# Patient Record
Sex: Female | Born: 1995 | Race: Black or African American | Hispanic: No | Marital: Married | Smoking: Never smoker
Health system: Southern US, Community
[De-identification: ages and names within clinical notes are randomized; demographics above are authoritative.]

## PROBLEM LIST (undated history)

## (undated) ENCOUNTER — Inpatient Hospital Stay (HOSPITAL_COMMUNITY): Payer: Self-pay

## (undated) DIAGNOSIS — T8859XA Other complications of anesthesia, initial encounter: Secondary | ICD-10-CM

## (undated) DIAGNOSIS — D649 Anemia, unspecified: Secondary | ICD-10-CM

## (undated) DIAGNOSIS — E611 Iron deficiency: Secondary | ICD-10-CM

## (undated) DIAGNOSIS — T4145XA Adverse effect of unspecified anesthetic, initial encounter: Secondary | ICD-10-CM

## (undated) HISTORY — PX: NO PAST SURGERIES: SHX2092

## (undated) HISTORY — PX: CORONARY ARTERY BYPASS GRAFT: SHX141

---
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
## 2013-02-20 ENCOUNTER — Encounter (HOSPITAL_COMMUNITY): Payer: Self-pay | Admitting: Emergency Medicine

## 2013-02-20 DIAGNOSIS — Z79899 Other long term (current) drug therapy: Secondary | ICD-10-CM | POA: Insufficient documentation

## 2013-02-20 DIAGNOSIS — Z3202 Encounter for pregnancy test, result negative: Secondary | ICD-10-CM | POA: Insufficient documentation

## 2013-02-20 DIAGNOSIS — N898 Other specified noninflammatory disorders of vagina: Secondary | ICD-10-CM | POA: Insufficient documentation

## 2013-02-20 DIAGNOSIS — Z792 Long term (current) use of antibiotics: Secondary | ICD-10-CM | POA: Insufficient documentation

## 2013-02-20 DIAGNOSIS — Z862 Personal history of diseases of the blood and blood-forming organs and certain disorders involving the immune mechanism: Secondary | ICD-10-CM | POA: Insufficient documentation

## 2013-02-20 LAB — CBC WITH DIFFERENTIAL/PLATELET
Basophils Absolute: 0 10*3/uL (ref 0.0–0.1)
Basophils Relative: 0 % (ref 0–1)
Eosinophils Absolute: 0.1 10*3/uL (ref 0.0–0.7)
Eosinophils Relative: 2 % (ref 0–5)
HCT: 32.8 % — ABNORMAL LOW (ref 36.0–46.0)
Hemoglobin: 10.2 g/dL — ABNORMAL LOW (ref 12.0–15.0)
Lymphocytes Relative: 43 % (ref 12–46)
Lymphs Abs: 3.2 10*3/uL (ref 0.7–4.0)
MCH: 23.4 pg — ABNORMAL LOW (ref 26.0–34.0)
MCHC: 31.1 g/dL (ref 30.0–36.0)
MCV: 75.4 fL — ABNORMAL LOW (ref 78.0–100.0)
Monocytes Absolute: 0.6 10*3/uL (ref 0.1–1.0)
Monocytes Relative: 8 % (ref 3–12)
Neutro Abs: 3.5 10*3/uL (ref 1.7–7.7)
Neutrophils Relative %: 47 % (ref 43–77)
Platelets: 290 10*3/uL (ref 150–400)
RBC: 4.35 MIL/uL (ref 3.87–5.11)
RDW: 15.2 % (ref 11.5–15.5)
WBC: 7.4 10*3/uL (ref 4.0–10.5)

## 2013-02-20 LAB — COMPREHENSIVE METABOLIC PANEL
ALT: 10 U/L (ref 0–35)
AST: 19 U/L (ref 0–37)
Albumin: 3.9 g/dL (ref 3.5–5.2)
Alkaline Phosphatase: 68 U/L (ref 39–117)
BUN: 12 mg/dL (ref 6–23)
CO2: 27 mEq/L (ref 19–32)
Calcium: 9.1 mg/dL (ref 8.4–10.5)
Chloride: 106 mEq/L (ref 96–112)
Creatinine, Ser: 0.74 mg/dL (ref 0.50–1.10)
GFR calc Af Amer: >90 mL/min (ref >90)
GFR calc non Af Amer: >90 mL/min (ref >90)
Glucose, Bld: 88 mg/dL (ref 70–99)
Potassium: 3.5 mEq/L (ref 3.5–5.1)
Sodium: 142 mEq/L (ref 135–145)
Total Bilirubin: <0.1 mg/dL — ABNORMAL LOW (ref 0.3–1.2)
Total Protein: 8 g/dL (ref 6.0–8.3)

## 2013-02-20 NOTE — ED Notes (Signed)
Pt. reports vaginal bleeding for 2 weeks with low abdominal cramping .

## 2013-02-21 ENCOUNTER — Emergency Department (HOSPITAL_COMMUNITY)
Admission: EM | Admit: 2013-02-21 | Discharge: 2013-02-21 | Disposition: A | Discharge status: Home / Self Care | Payer: Medicaid Other | Attending: Emergency Medicine | Admitting: Emergency Medicine

## 2013-02-21 DIAGNOSIS — N939 Abnormal uterine and vaginal bleeding, unspecified: Secondary | ICD-10-CM

## 2013-02-21 LAB — URINALYSIS, ROUTINE W REFLEX MICROSCOPIC
Bilirubin Urine: NEGATIVE
Glucose, UA: NEGATIVE mg/dL
Ketones, ur: NEGATIVE mg/dL
Leukocytes, UA: NEGATIVE
Nitrite: NEGATIVE
Protein, ur: 30 mg/dL — AB
Specific Gravity, Urine: 1.037 — ABNORMAL HIGH (ref 1.005–1.030)
Urobilinogen, UA: 0.2 mg/dL (ref 0.0–1.0)
pH: 6.5 (ref 5.0–8.0)

## 2013-02-21 LAB — URINE MICROSCOPIC-ADD ON

## 2013-02-21 LAB — POCT PREGNANCY, URINE: Preg Test, Ur: NEGATIVE

## 2013-02-21 MED ORDER — NORETHIN-ETH ESTRAD-FE BIPHAS 1 MG-10 MCG / 10 MCG PO TABS: 1.0000 | ORAL_TABLET | Freq: Every day | ORAL | Status: DC

## 2013-02-21 MED ORDER — METRONIDAZOLE 500 MG PO TABS: 500.0000 mg | ORAL_TABLET | Freq: Two times a day (BID) | ORAL | Status: DC

## 2013-02-21 NOTE — ED Notes (Signed)
Pt ambulating independently w/ steady gait on d/c in no acute distress, A&Ox4. D/c instructions reviewed w/ pt and family - pt and family deny any further questions or concerns at present. Rx given x2  

## 2013-02-21 NOTE — ED Provider Notes (Signed)
CSN: [id number]     Arrival date & time 02/20/13  2242 History   First MD Initiated Contact with Patient 02/21/13 0401     Chief Complaint  Patient presents with  . Vaginal Bleeding   (Consider location/radiation/quality/duration/timing/severity/associated sxs/prior Treatment) HPI Comments: 17 year old female with a history of irregular periods and reports that she has had 2 weeks of intermittent vaginal bleeding with abdominal cramping. She is sexually active with another partner and only intermittently uses protection. 7 months ago she stopped taking birth control pills and since then she has had irregular menses.  Patient is a 17 y.o. female presenting with vaginal bleeding. The history is provided by the patient.  Vaginal Bleeding   Past Medical History  Diagnosis Date  . Low iron   . Anemia    History reviewed. No pertinent past surgical history. No family history on file. History  Substance Use Topics  . Smoking status: Never Smoker   . Smokeless tobacco: Not on file  . Alcohol Use: Yes     Comment: Ocassional   OB History   Grav Para Term Preterm Abortions TAB SAB Ect Mult Living                 Review of Systems  Genitourinary: Positive for vaginal bleeding.  All other systems reviewed and are negative.    Allergies  Review of patient's allergies indicates no known allergies.  Home Medications   Current Outpatient Rx  Name  Route  Sig  Dispense  Refill  . ibuprofen (ADVIL,MOTRIN) 200 MG tablet   Oral   Take 600 mg by mouth every 6 (six) hours as needed for fever, headache, mild pain, moderate pain or cramping.         . metroNIDAZOLE (FLAGYL) 500 MG tablet   Oral   Take 1 tablet (500 mg total) by mouth 2 (two) times daily.   14 tablet   0   . Norethindrone-Ethinyl Estradiol-Fe Biphas (LO LOESTRIN FE) 1 MG-10 MCG / 10 MCG tablet   Oral   Take 1 tablet by mouth daily.   1 Package   11    BP 109/65  Pulse 74  Temp(Src) 98.9 F (37.2 C) (Oral)   Resp 18  Ht 5\' 5"  (1.651 m)  Wt 137 lb (62.143 kg)  BMI 22.80 kg/m2  SpO2 100%  LMP 02/05/2013 Physical Exam  Nursing note and vitals reviewed. Constitutional: She appears well-developed and well-nourished. No distress.  HENT:  Head: Normocephalic and atraumatic.  Mouth/Throat: Oropharynx is clear and moist. No oropharyngeal exudate.  Eyes: Conjunctivae and EOM are normal. Pupils are equal, round, and reactive to light. Right eye exhibits no discharge. Left eye exhibits no discharge. No scleral icterus.  Neck: Normal range of motion. Neck supple. No JVD present. No thyromegaly present.  Cardiovascular: Normal rate, regular rhythm, normal heart sounds and intact distal pulses.  Exam reveals no gallop and no friction rub.   No murmur heard. Pulmonary/Chest: Effort normal and breath sounds normal. No respiratory distress. She has no wheezes. She has no rales.  Abdominal: Soft. Bowel sounds are normal. She exhibits no distension and no mass. There is no tenderness.  Genitourinary:  Patient declines exam  Musculoskeletal: Normal range of motion. She exhibits no edema and no tenderness.  Lymphadenopathy:    She has no cervical adenopathy.  Neurological: She is alert. Coordination normal.  Skin: Skin is warm and dry. No rash noted. No erythema.  Psychiatric: She has a normal mood and affect.  Her behavior is normal.    ED Course  Procedures (including critical care time) Labs Review Labs Reviewed  URINALYSIS, ROUTINE W REFLEX MICROSCOPIC - Abnormal; Notable for the following:    APPearance TURBID (*)    Specific Gravity, Urine 1.037 (*)    Hgb urine dipstick LARGE (*)    Protein, ur 30 (*)    All other components within normal limits  CBC WITH DIFFERENTIAL - Abnormal; Notable for the following:    Hemoglobin 10.2 (*)    HCT 32.8 (*)    MCV 75.4 (*)    MCH 23.4 (*)    All other components within normal limits  COMPREHENSIVE METABOLIC PANEL - Abnormal; Notable for the following:     Total Bilirubin <0.1 (*)    All other components within normal limits  URINE MICROSCOPIC-ADD ON - Abnormal; Notable for the following:    Squamous Epithelial / LPF FEW (*)    All other components within normal limits  POCT PREGNANCY, URINE   Imaging Review No results found.  EKG Interpretation   None       MDM   1. Vaginal bleeding    The patient has a soft nontender abdomen, she is not pregnant, she does complain that she may have an ongoing bacterial infection as she did not finish taking her Flagyl last time that she was diagnosed with a vaginal infection. She declines a pelvic exam but states that she would like another prescription for the bacterial vaginosis. I will restart her on oral contraceptive pills to help regulate her cycle, recommend gynecologist followup, patient is agreeable and appears nontoxic. Mild anemia seen on labs  Laboratory results reveal mild anemia, urinalysis without infection, small amount of blood. Not pregnant, stable for discharge on oral contraceptive and gynecology referral, this was discussed with the patient at length who agrees to do the same.  Vida Roller, MD 02/21/13 [phone]

## 2013-02-21 NOTE — ED Notes (Signed)
Pt reports she has been on her menstrual cycle x2 weeks, pt has a hx of same and states that her gynecologist at that time was able to give her a pill to take to cease the bleeding. Pt has been off of her birth control x7 months, admits to unprotected sexual intercourse as well. Pt also states she was diagnosed previously with a vaginal bacterial infection and forgot to complete her course of antibiotics. Pt also requesting medications for this as she is having similar symptoms.

## 2013-02-23 NOTE — L&D Delivery Note (Signed)
Delivery Note At 1:16 AM a viable, healthy female was delivered via Vaginal over an intact perineum. Spontaneous Delivery (Presentation: Right Occiput Posterior).  APGAR: 8, 9; weight: pending at time of note.  Placenta status: Intact, Spontaneous.  Cord: 3 vessels with the following complications: Nuchal times 2.   Anesthesia: Epidural  Episiotomy: none Lacerations: none (sm abrasion at base of introitus, well approx, not bldg) Suture Repair: N/A Est. Blood Loss (mL): 200  Family at bedside for support.  Mother bonding with baby well, skin to skin immediately after birth. Mom to postpartum.  Baby to Couplet care / Skin to Skin.  ADAMS,SHNIQUAL SHWON 12/23/2013, 1:37 AM  I have seen and examined this patient and I agree with the above. Cam HaiSHAW, KIMBERLY CNM 2:25 AM 12/23/2013

## 2013-05-23 ENCOUNTER — Encounter: Payer: Self-pay | Admitting: Obstetrics & Gynecology

## 2013-05-23 ENCOUNTER — Ambulatory Visit (INDEPENDENT_AMBULATORY_CARE_PROVIDER_SITE_OTHER): Payer: Medicaid Other

## 2013-05-23 VITALS — BP 118/68 | HR 81

## 2013-05-23 DIAGNOSIS — Z3201 Encounter for pregnancy test, result positive: Secondary | ICD-10-CM

## 2013-05-23 DIAGNOSIS — N926 Irregular menstruation, unspecified: Secondary | ICD-10-CM

## 2013-05-23 LAB — POCT URINALYSIS DIP (DEVICE)
Bilirubin Urine: NEGATIVE
Glucose, UA: NEGATIVE mg/dL
Ketones, ur: NEGATIVE mg/dL
Nitrite: NEGATIVE
Protein, ur: 30 mg/dL — AB
Specific Gravity, Urine: 1.02 (ref 1.005–1.030)
Urobilinogen, UA: 1 mg/dL (ref 0.0–1.0)
pH: 7.5 (ref 5.0–8.0)

## 2013-05-23 LAB — HIV ANTIBODY (ROUTINE TESTING W REFLEX): HIV: NONREACTIVE

## 2013-05-23 LAB — POCT PREGNANCY, URINE: Preg Test, Ur: POSITIVE — AB

## 2013-05-23 NOTE — Progress Notes (Signed)
Pt. Here today for pregnancy test as she has not had a period since 03/22/13. Pt. Reports her periods were regular. Pregnancy test positive. Pt. Would like to receive care here. Based on LMP pt. 9weeks today. New OB labs today. Growth and anatomy ultrasound scheduled for August 02, 2013 at 0830. Letter of pregnancy verification given. EDD: December 27, 2013. Pt. To schedule New OB appointment.

## 2013-05-24 ENCOUNTER — Encounter: Payer: Self-pay | Admitting: Obstetrics & Gynecology

## 2013-05-24 ENCOUNTER — Telehealth: Payer: Self-pay

## 2013-05-24 LAB — OBSTETRIC PANEL
Antibody Screen: NEGATIVE
Basophils Absolute: 0 10*3/uL (ref 0.0–0.1)
Basophils Relative: 0 % (ref 0–1)
Eosinophils Absolute: 0.1 10*3/uL (ref 0.0–0.7)
Eosinophils Relative: 1 % (ref 0–5)
HCT: 31.7 % — ABNORMAL LOW (ref 36.0–46.0)
Hemoglobin: 10.1 g/dL — ABNORMAL LOW (ref 12.0–15.0)
Hepatitis B Surface Ag: NEGATIVE
Lymphocytes Relative: 24 % (ref 12–46)
Lymphs Abs: 1.2 10*3/uL (ref 0.7–4.0)
MCH: 22.7 pg — ABNORMAL LOW (ref 26.0–34.0)
MCHC: 31.9 g/dL (ref 30.0–36.0)
MCV: 71.4 fL — ABNORMAL LOW (ref 78.0–100.0)
Monocytes Absolute: 0.4 10*3/uL (ref 0.1–1.0)
Monocytes Relative: 8 % (ref 3–12)
Neutro Abs: 3.5 10*3/uL (ref 1.7–7.7)
Neutrophils Relative %: 67 % (ref 43–77)
Platelets: 313 10*3/uL (ref 150–400)
RBC: 4.44 MIL/uL (ref 3.87–5.11)
RDW: 15.9 % — ABNORMAL HIGH (ref 11.5–15.5)
Rh Type: POSITIVE
Rubella: 6.46 Index — ABNORMAL HIGH (ref <0.90)
WBC: 5.2 10*3/uL (ref 4.0–10.5)

## 2013-05-24 NOTE — Telephone Encounter (Signed)
Called pt and informed pt that her blood levels show that she is anemic and that the provider would like for her to take otc iron supplements po bid for anemia.  Pt stated understanding.

## 2013-05-24 NOTE — Telephone Encounter (Signed)
Message copied by Faythe CasaBELLAMY, Denim Kalmbach M on Wed May 24, 2013  4:17 PM ------      Message from: Willodean RosenthalHARRAWAY-SMITH, CAROLYN      Created: Wed May 24, 2013  2:07 PM       Please call pt.  She needs to begin FeSO4 for bid anemia.            clh-S ------

## 2013-05-25 LAB — HEMOGLOBINOPATHY EVALUATION
Hemoglobin Other: 0 %
Hgb A2 Quant: 2.8 % (ref 2.2–3.2)
Hgb A: 97.2 % (ref 96.8–97.8)
Hgb F Quant: 0 % (ref 0.0–2.0)
Hgb S Quant: 0 %

## 2013-06-28 ENCOUNTER — Ambulatory Visit (INDEPENDENT_AMBULATORY_CARE_PROVIDER_SITE_OTHER): Payer: Self-pay | Admitting: Obstetrics and Gynecology

## 2013-06-28 ENCOUNTER — Encounter: Payer: Self-pay | Admitting: Obstetrics and Gynecology

## 2013-06-28 VITALS — BP 117/77 | HR 97 | Temp 98.5°F | Wt 138.7 lb

## 2013-06-28 DIAGNOSIS — Z3400 Encounter for supervision of normal first pregnancy, unspecified trimester: Secondary | ICD-10-CM

## 2013-06-28 LAB — POCT URINALYSIS DIP (DEVICE)
Bilirubin Urine: NEGATIVE
Glucose, UA: NEGATIVE mg/dL
Hgb urine dipstick: NEGATIVE
Ketones, ur: NEGATIVE mg/dL
Leukocytes, UA: NEGATIVE
Nitrite: NEGATIVE
Protein, ur: 30 mg/dL — AB
Specific Gravity, Urine: 1.025 (ref 1.005–1.030)
Urobilinogen, UA: 0.2 mg/dL (ref 0.0–1.0)
pH: 7 (ref 5.0–8.0)

## 2013-06-28 LAB — OB RESULTS CONSOLE GBS: GBS: POSITIVE

## 2013-06-28 NOTE — Patient Instructions (Signed)
Second Trimester of Pregnancy The second trimester is from week 13 through week 28, months 4 through 6. The second trimester is often a time when you feel your best. Your body has also adjusted to being pregnant, and you begin to feel better physically. Usually, morning sickness has lessened or quit completely, you may have more energy, and you may have an increase in appetite. The second trimester is also a time when the fetus is growing rapidly. At the end of the sixth month, the fetus is about 9 inches long and weighs about 1 pounds. You will likely begin to feel the baby move (quickening) between 18 and 20 weeks of the pregnancy. BODY CHANGES Your body goes through many changes during pregnancy. The changes vary from woman to woman.   Your weight will continue to increase. You will notice your lower abdomen bulging out.  You may begin to get stretch marks on your hips, abdomen, and breasts.  You may develop headaches that can be relieved by medicines approved by your caregiver.  You may urinate more often because the fetus is pressing on your bladder.  You may develop or continue to have heartburn as a result of your pregnancy.  You may develop constipation because certain hormones are causing the muscles that push waste through your intestines to slow down.  You may develop hemorrhoids or swollen, bulging veins (varicose veins).  You may have back pain because of the weight gain and pregnancy hormones relaxing your joints between the bones in your pelvis and as a result of a shift in weight and the muscles that support your balance.  Your breasts will continue to grow and be tender.  Your gums may bleed and may be sensitive to brushing and flossing.  Dark spots or blotches (chloasma, mask of pregnancy) may develop on your face. This will likely fade after the baby is born.  A dark line from your belly button to the pubic area (linea nigra) may appear. This will likely fade after the  baby is born. WHAT TO EXPECT AT YOUR PRENATAL VISITS During a routine prenatal visit:  You will be weighed to make sure you and the fetus are growing normally.  Your blood pressure will be taken.  Your abdomen will be measured to track your baby's growth.  The fetal heartbeat will be listened to.  Any test results from the previous visit will be discussed. Your caregiver may ask you:  How you are feeling.  If you are feeling the baby move.  If you have had any abnormal symptoms, such as leaking fluid, bleeding, severe headaches, or abdominal cramping.  If you have any questions. Other tests that may be performed during your second trimester include:  Blood tests that check for:  Low iron levels (anemia).  Gestational diabetes (between 24 and 28 weeks).  Rh antibodies.  Urine tests to check for infections, diabetes, or protein in the urine.  An ultrasound to confirm the proper growth and development of the baby.  An amniocentesis to check for possible genetic problems.  Fetal screens for spina bifida and Down syndrome. HOME CARE INSTRUCTIONS   Avoid all smoking, herbs, alcohol, and unprescribed drugs. These chemicals affect the formation and growth of the baby.  Follow your caregiver's instructions regarding medicine use. There are medicines that are either safe or unsafe to take during pregnancy.  Exercise only as directed by your caregiver. Experiencing uterine cramps is a good sign to stop exercising.  Continue to eat regular,   healthy meals.  Wear a good support bra for breast tenderness.  Do not use hot tubs, steam rooms, or saunas.  Wear your seat belt at all times when driving.  Avoid raw meat, uncooked cheese, cat litter boxes, and soil used by cats. These carry germs that can cause birth defects in the baby.  Take your prenatal vitamins.  Try taking a stool softener (if your caregiver approves) if you develop constipation. Eat more high-fiber foods,  such as fresh vegetables or fruit and whole grains. Drink plenty of fluids to keep your urine clear or pale yellow.  Take warm sitz baths to soothe any pain or discomfort caused by hemorrhoids. Use hemorrhoid cream if your caregiver approves.  If you develop varicose veins, wear support hose. Elevate your feet for 15 minutes, 3 4 times a day. Limit salt in your diet.  Avoid heavy lifting, wear low heel shoes, and practice good posture.  Rest with your legs elevated if you have leg cramps or low back pain.  Visit your dentist if you have not gone yet during your pregnancy. Use a soft toothbrush to brush your teeth and be gentle when you floss.  A sexual relationship may be continued unless your caregiver directs you otherwise.  Continue to go to all your prenatal visits as directed by your caregiver. SEEK MEDICAL CARE IF:   You have dizziness.  You have mild pelvic cramps, pelvic pressure, or nagging pain in the abdominal area.  You have persistent nausea, vomiting, or diarrhea.  You have a bad smelling vaginal discharge.  You have pain with urination. SEEK IMMEDIATE MEDICAL CARE IF:   You have a fever.  You are leaking fluid from your vagina.  You have spotting or bleeding from your vagina.  You have severe abdominal cramping or pain.  You have rapid weight gain or loss.  You have shortness of breath with chest pain.  You notice sudden or extreme swelling of your face, hands, ankles, feet, or legs.  You have not felt your baby move in over an hour.  You have severe headaches that do not go away with medicine.  You have vision changes. Document Released: 02/03/2001 Document Revised: 10/12/2012 Document Reviewed: 04/12/2012 [employer] Patient Information 2014 [employer].  

## 2013-06-28 NOTE — Progress Notes (Signed)
C/o of white discharge with slight odor; never finished treatment for BV. Wet prep today.  All new OB labs drawn 3/31. Urine culture and cultures to be done today.  New OB packet given.  Discussed appropriate weight gain (25-35); pt. Verbalized understanding.

## 2013-06-28 NOTE — Progress Notes (Signed)
   Subjective:    Anita Robinson is a G1P0 7458w0d being seen today for her first obstetrical visit.  Her obstetrical history is significant for G1. Patient does intend to breast feed. Pregnancy history fully reviewed.  Patient reports nausea. Supportive family and SOB. Works [employer].  Filed Vitals:   06/28/13 0934  BP: 117/77  Pulse: 97  Temp: 98.5 F (36.9 C)  Weight: 138 lb 11.2 oz (62.914 kg)    HISTORY: OB History  Gravida Para Term Preterm AB SAB TAB Ectopic Multiple Living  1             # Outcome Date GA Lbr Len/2nd Weight Sex Delivery Anes PTL Lv  1 CUR              Past Medical History  Diagnosis Date  . Low iron   . Anemia    History reviewed. No pertinent past surgical history. History reviewed. No pertinent family history.   Exam    Uterus:   S=D FHR 160  Pelvic Exam:    Perineum: No Hemorrhoids   Vulva: normal, Bartholin's, Urethra, Skene's normal   Vagina:  normal mucosa, normal discharge       Cervix: no bleeding following Pap and nulliparous appearance   Adnexa: not evaluated   Bony Pelvis: average  System: Breast:  normal appearance, no masses or tenderness, Inspection negative   Skin: normal coloration and turgor, no rashes    Neurologic: oriented, normal, grossly non-focal   Extremities: normal strength, tone, and muscle mass, no deformities   HEENT PERRLA, extra ocular movement intact and thyroid without masses   Mouth/Teeth mucous membranes moist, pharynx normal without lesions and dental hygiene good   Neck supple and no masses   Cardiovascular: regular rate and rhythm, no murmurs or gallops   Respiratory:  appears well, vitals normal, no respiratory distress, acyanotic, normal RR, ear and throat exam is normal, neck free of mass or lymphadenopathy, chest clear, no wheezing, crepitations, rhonchi, normal symmetric air entry   Abdomen: soft, non-tender; bowel sounds normal; no masses,  no organomegaly   Urinary: urethral meatus normal       Assessment:    Pregnancy: G1P0 Patient Active Problem List   Diagnosis Date Noted  . Anemia complicating pregnancy in first trimester 05/24/2013        Plan:     Initial labs drawn. Prenatal vitamins. Problem list reviewed and updated. Genetic Screening discussed Quad Screen: undecided.  Ultrasound discussed; fetal survey: ordered.  Follow up in 4 weeks. 50% of 30 min visit spent on counseling and coordination of care.  Visit routines, fetal development, nausea discussed.   Corine Solorio Colin Mulders Arvil Utz 06/28/2013

## 2013-06-29 LAB — WET PREP, GENITAL
Clue Cells Wet Prep HPF POC: NONE SEEN
Trich, Wet Prep: NONE SEEN

## 2013-06-29 LAB — GC/CHLAMYDIA PROBE AMP
CT Probe RNA: NEGATIVE
GC Probe RNA: NEGATIVE

## 2013-07-01 LAB — CULTURE, OB URINE: Colony Count: 1000

## 2013-07-03 ENCOUNTER — Telehealth: Payer: Self-pay | Admitting: *Deleted

## 2013-07-03 DIAGNOSIS — B379 Candidiasis, unspecified: Secondary | ICD-10-CM

## 2013-07-03 MED ORDER — FLUCONAZOLE 150 MG PO TABS: 150.0000 mg | ORAL_TABLET | Freq: Every day | ORAL | Status: DC

## 2013-07-03 NOTE — Telephone Encounter (Signed)
Pt has moderate yeast on wet prep. Sent rx for diflucan to pharmacy.

## 2013-07-04 ENCOUNTER — Telehealth: Payer: Self-pay | Admitting: General Practice

## 2013-07-04 DIAGNOSIS — O2342 Unspecified infection of urinary tract in pregnancy, second trimester: Principal | ICD-10-CM

## 2013-07-04 DIAGNOSIS — B951 Streptococcus, group B, as the cause of diseases classified elsewhere: Secondary | ICD-10-CM

## 2013-07-04 NOTE — Telephone Encounter (Signed)
Message copied by Kathee DeltonHILLMAN, Cashmere Dingley L on Tue Jul 04, 2013  4:44 PM ------      Message from: Danae OrleansPOE, DEIRDRE C      Created: Tue Jul 04, 2013  4:31 PM       Strep in urine> Rx Amox 500 tid x7 ------

## 2013-07-04 NOTE — Telephone Encounter (Signed)
Called patient and man answered stating she wasn't in. Told him to have the patient call us back. He stated that he would

## 2013-07-05 MED ORDER — AMOXICILLIN 500 MG PO CAPS: 500.0000 mg | ORAL_CAPSULE | Freq: Three times a day (TID) | ORAL | Status: DC

## 2013-07-05 NOTE — Telephone Encounter (Signed)
Called pt and notified her of UTI requiring antibiotic treatment.  Pt voiced understanding. Rx sent to pharmacy.  Pt asked for her next appt to be with a midwife.  Pt advised that her appt will be changed to 6/3 @ 0940 w/midwife.  She voiced understanding.

## 2013-07-26 ENCOUNTER — Ambulatory Visit (INDEPENDENT_AMBULATORY_CARE_PROVIDER_SITE_OTHER): Payer: Medicaid Other | Admitting: Advanced Practice Midwife

## 2013-07-26 ENCOUNTER — Encounter: Payer: Self-pay | Admitting: Family Medicine

## 2013-07-26 VITALS — BP 107/70 | HR 96 | Temp 98.5°F | Wt 144.0 lb

## 2013-07-26 DIAGNOSIS — Z3490 Encounter for supervision of normal pregnancy, unspecified, unspecified trimester: Secondary | ICD-10-CM

## 2013-07-26 DIAGNOSIS — Z3480 Encounter for supervision of other normal pregnancy, unspecified trimester: Secondary | ICD-10-CM

## 2013-07-26 LAB — POCT URINALYSIS DIP (DEVICE)
Bilirubin Urine: NEGATIVE
Glucose, UA: NEGATIVE mg/dL
Hgb urine dipstick: NEGATIVE
Ketones, ur: NEGATIVE mg/dL
Nitrite: NEGATIVE
Protein, ur: NEGATIVE mg/dL
Specific Gravity, Urine: 1.025 (ref 1.005–1.030)
Urobilinogen, UA: 0.2 mg/dL (ref 0.0–1.0)
pH: 7 (ref 5.0–8.0)

## 2013-07-26 NOTE — Progress Notes (Signed)
Pt reports back pain and is having difficulty sleeping.  Bump on abdomen that pops out and then goes away.

## 2013-07-26 NOTE — Progress Notes (Signed)
Doing well. Has some back pain at times. Describes "bump" as coming and going, to right of midline. No hernia appreciated. Too high for inguinal hernia. Probably GI. Non-painful.

## 2013-07-26 NOTE — Patient Instructions (Signed)
Second Trimester of Pregnancy The second trimester is from week 13 through week 28, months 4 through 6. The second trimester is often a time when you feel your best. Your body has also adjusted to being pregnant, and you begin to feel better physically. Usually, morning sickness has lessened or quit completely, you may have more energy, and you may have an increase in appetite. The second trimester is also a time when the fetus is growing rapidly. At the end of the sixth month, the fetus is about 9 inches long and weighs about 1 pounds. You will likely begin to feel the baby move (quickening) between 18 and 20 weeks of the pregnancy. BODY CHANGES Your body goes through many changes during pregnancy. The changes vary from woman to woman.   Your weight will continue to increase. You will notice your lower abdomen bulging out.  You may begin to get stretch marks on your hips, abdomen, and breasts.  You may develop headaches that can be relieved by medicines approved by your caregiver.  You may urinate more often because the fetus is pressing on your bladder.  You may develop or continue to have heartburn as a result of your pregnancy.  You may develop constipation because certain hormones are causing the muscles that push waste through your intestines to slow down.  You may develop hemorrhoids or swollen, bulging veins (varicose veins).  You may have back pain because of the weight gain and pregnancy hormones relaxing your joints between the bones in your pelvis and as a result of a shift in weight and the muscles that support your balance.  Your breasts will continue to grow and be tender.  Your gums may bleed and may be sensitive to brushing and flossing.  Dark spots or blotches (chloasma, mask of pregnancy) may develop on your face. This will likely fade after the baby is born.  A dark line from your belly button to the pubic area (linea nigra) may appear. This will likely fade after the  baby is born. WHAT TO EXPECT AT YOUR PRENATAL VISITS During a routine prenatal visit:  You will be weighed to make sure you and the fetus are growing normally.  Your blood pressure will be taken.  Your abdomen will be measured to track your baby's growth.  The fetal heartbeat will be listened to.  Any test results from the previous visit will be discussed. Your caregiver may ask you:  How you are feeling.  If you are feeling the baby move.  If you have had any abnormal symptoms, such as leaking fluid, bleeding, severe headaches, or abdominal cramping.  If you have any questions. Other tests that may be performed during your second trimester include:  Blood tests that check for:  Low iron levels (anemia).  Gestational diabetes (between 24 and 28 weeks).  Rh antibodies.  Urine tests to check for infections, diabetes, or protein in the urine.  An ultrasound to confirm the proper growth and development of the baby.  An amniocentesis to check for possible genetic problems.  Fetal screens for spina bifida and Down syndrome. HOME CARE INSTRUCTIONS   Avoid all smoking, herbs, alcohol, and unprescribed drugs. These chemicals affect the formation and growth of the baby.  Follow your caregiver's instructions regarding medicine use. There are medicines that are either safe or unsafe to take during pregnancy.  Exercise only as directed by your caregiver. Experiencing uterine cramps is a good sign to stop exercising.  Continue to eat regular,   healthy meals.  Wear a good support bra for breast tenderness.  Do not use hot tubs, steam rooms, or saunas.  Wear your seat belt at all times when driving.  Avoid raw meat, uncooked cheese, cat litter boxes, and soil used by cats. These carry germs that can cause birth defects in the baby.  Take your prenatal vitamins.  Try taking a stool softener (if your caregiver approves) if you develop constipation. Eat more high-fiber foods,  such as fresh vegetables or fruit and whole grains. Drink plenty of fluids to keep your urine clear or pale yellow.  Take warm sitz baths to soothe any pain or discomfort caused by hemorrhoids. Use hemorrhoid cream if your caregiver approves.  If you develop varicose veins, wear support hose. Elevate your feet for 15 minutes, 3 4 times a day. Limit salt in your diet.  Avoid heavy lifting, wear low heel shoes, and practice good posture.  Rest with your legs elevated if you have leg cramps or low back pain.  Visit your dentist if you have not gone yet during your pregnancy. Use a soft toothbrush to brush your teeth and be gentle when you floss.  A sexual relationship may be continued unless your caregiver directs you otherwise.  Continue to go to all your prenatal visits as directed by your caregiver. SEEK MEDICAL CARE IF:   You have dizziness.  You have mild pelvic cramps, pelvic pressure, or nagging pain in the abdominal area.  You have persistent nausea, vomiting, or diarrhea.  You have a bad smelling vaginal discharge.  You have pain with urination. SEEK IMMEDIATE MEDICAL CARE IF:   You have a fever.  You are leaking fluid from your vagina.  You have spotting or bleeding from your vagina.  You have severe abdominal cramping or pain.  You have rapid weight gain or loss.  You have shortness of breath with chest pain.  You notice sudden or extreme swelling of your face, hands, ankles, feet, or legs.  You have not felt your baby move in over an hour.  You have severe headaches that do not go away with medicine.  You have vision changes. Document Released: 02/03/2001 Document Revised: 10/12/2012 Document Reviewed: 04/12/2012 [employer] Patient Information 2014 [employer].  

## 2013-08-01 ENCOUNTER — Other Ambulatory Visit: Payer: Self-pay | Admitting: Obstetrics & Gynecology

## 2013-08-02 ENCOUNTER — Ambulatory Visit (HOSPITAL_COMMUNITY)
Admission: RE | Admit: 2013-08-02 | Discharge: 2013-08-02 | Disposition: A | Discharge status: Home / Self Care | Payer: Medicaid Other | Source: Ambulatory Visit | Attending: Obstetrics & Gynecology | Admitting: Obstetrics & Gynecology

## 2013-08-02 DIAGNOSIS — Z3201 Encounter for pregnancy test, result positive: Secondary | ICD-10-CM

## 2013-08-02 DIAGNOSIS — Z3689 Encounter for other specified antenatal screening: Secondary | ICD-10-CM | POA: Insufficient documentation

## 2013-08-02 DIAGNOSIS — O99011 Anemia complicating pregnancy, first trimester: Secondary | ICD-10-CM

## 2013-08-08 ENCOUNTER — Encounter (HOSPITAL_COMMUNITY): Payer: Self-pay | Admitting: *Deleted

## 2013-08-08 ENCOUNTER — Inpatient Hospital Stay (HOSPITAL_COMMUNITY)
Admission: AD | Admit: 2013-08-08 | Discharge: 2013-08-08 | Disposition: A | Discharge status: Home / Self Care | Payer: Medicaid Other | Source: Ambulatory Visit | Attending: Obstetrics & Gynecology | Admitting: Obstetrics & Gynecology

## 2013-08-08 DIAGNOSIS — K529 Noninfective gastroenteritis and colitis, unspecified: Secondary | ICD-10-CM

## 2013-08-08 DIAGNOSIS — A5901 Trichomonal vulvovaginitis: Secondary | ICD-10-CM | POA: Insufficient documentation

## 2013-08-08 DIAGNOSIS — A599 Trichomoniasis, unspecified: Secondary | ICD-10-CM

## 2013-08-08 DIAGNOSIS — R109 Unspecified abdominal pain: Secondary | ICD-10-CM | POA: Insufficient documentation

## 2013-08-08 DIAGNOSIS — O98819 Other maternal infectious and parasitic diseases complicating pregnancy, unspecified trimester: Secondary | ICD-10-CM | POA: Insufficient documentation

## 2013-08-08 DIAGNOSIS — K5289 Other specified noninfective gastroenteritis and colitis: Secondary | ICD-10-CM | POA: Insufficient documentation

## 2013-08-08 DIAGNOSIS — O210 Mild hyperemesis gravidarum: Secondary | ICD-10-CM | POA: Insufficient documentation

## 2013-08-08 LAB — URINE MICROSCOPIC-ADD ON

## 2013-08-08 LAB — URINALYSIS, ROUTINE W REFLEX MICROSCOPIC
Bilirubin Urine: NEGATIVE
Glucose, UA: NEGATIVE mg/dL
Hgb urine dipstick: NEGATIVE
Ketones, ur: 40 mg/dL — AB
Nitrite: NEGATIVE
Protein, ur: NEGATIVE mg/dL
Specific Gravity, Urine: 1.01 (ref 1.005–1.030)
Urobilinogen, UA: 1 mg/dL (ref 0.0–1.0)
pH: 7 (ref 5.0–8.0)

## 2013-08-08 MED ORDER — ONDANSETRON 8 MG PO TBDP
8.0000 mg | ORAL_TABLET | Freq: Once | ORAL | Status: AC
  Administered 2013-08-08: 8 mg via ORAL
  Filled 2013-08-08: qty 1

## 2013-08-08 MED ORDER — PROMETHAZINE HCL 25 MG PO TABS: 25.0000 mg | ORAL_TABLET | Freq: Four times a day (QID) | ORAL | Status: DC | PRN

## 2013-08-08 MED ORDER — METRONIDAZOLE 500 MG PO TABS
2000.0000 mg | ORAL_TABLET | Freq: Once | ORAL | Status: AC
  Administered 2013-08-08: 2000 mg via ORAL
  Filled 2013-08-08: qty 4

## 2013-08-08 MED ORDER — ONDANSETRON 4 MG PO TBDP: 4.0000 mg | ORAL_TABLET | Freq: Four times a day (QID) | ORAL | Status: DC | PRN

## 2013-08-08 NOTE — Progress Notes (Signed)
Pt is having upper and lower abdominal pain

## 2013-08-08 NOTE — MAU Note (Signed)
Pt states she feels "much better" 

## 2013-08-08 NOTE — Discharge Instructions (Signed)
Morning Sickness Morning sickness is when you feel sick to your stomach (nauseous) during pregnancy. This nauseous feeling may or may not come with vomiting. It often occurs in the morning but can be a problem any time of day. Morning sickness is most common during the first trimester, but it may continue throughout pregnancy. While morning sickness is unpleasant, it is usually harmless unless you develop severe and continual vomiting (hyperemesis gravidarum). This condition requires more intense treatment.  CAUSES  The cause of morning sickness is not completely known but seems to be related to normal hormonal changes that occur in pregnancy. RISK FACTORS You are at greater risk if you:  Experienced nausea or vomiting before your pregnancy.  Had morning sickness during a previous pregnancy.  Are pregnant with more than one baby, such as twins. TREATMENT  Do not use any medicines (prescription, over-the-counter, or herbal) for morning sickness without first talking to your health care provider. Your health care provider may prescribe or recommend:  Vitamin B6 supplements in the morning and at bedtime and Unisom (Doxalymine) at bedtime.  Anti-nausea medicines.  The herbal medicine ginger. HOME CARE INSTRUCTIONS   Only take over-the-counter or prescription medicines as directed by your health care provider.  Taking multivitamins before getting pregnant can prevent or decrease the severity of morning sickness in most women.   Eat a piece of dry toast or unsalted crackers before getting out of bed in the morning.   Eat five or six small meals a day.   Eat dry and bland foods (rice, baked potato). Foods high in carbohydrates are often helpful.  Do not drink liquids with your meals. Drink liquids between meals.   Avoid greasy, fatty, and spicy foods.   Get someone to cook for you if the smell of any food causes nausea and vomiting.   If you feel nauseous after taking prenatal  vitamins, take the vitamins at night or with a snack.  Snack on protein foods (nuts, yogurt, cheese) between meals if you are hungry.   Eat unsweetened gelatins for desserts.   Wearing an acupressure wristband (worn for sea sickness) may be helpful.   Acupuncture may be helpful.   Do not smoke.   Get a humidifier to keep the air in your house free of odors.   Get plenty of fresh air. SEEK MEDICAL CARE IF:   Your home remedies are not working, and you need medicine.  You feel dizzy or lightheaded.  You are losing weight. SEEK IMMEDIATE MEDICAL CARE IF:   You have persistent and uncontrolled nausea and vomiting.  You pass out (faint). Document Released: 04/02/2006 Document Revised: 10/12/2012 Document Reviewed: 07/27/2012 [facility] Patient Information 2014 [place], [place].

## 2013-08-08 NOTE — MAU Note (Signed)
Pt states she diarrhea 2-3 yesterday but nothing today.Pt state the throwing up is bad. Pt states she has thrown up 3 times today.pt throws up every time she tries to eat

## 2013-08-08 NOTE — MAU Provider Note (Signed)
History     CSN: [id number]  Arrival date and time: 08/08/13 1610   First Provider Initiated Contact with Patient 08/08/13 1645      Chief Complaint  Patient presents with  . Emesis During Pregnancy  . Diarrhea  . Abdominal Pain   HPI  Anita Robinson is a 18 y/o G1P0 at 747w6d who presents to the MAU with complaints of abdominal pain, nausea and vomiting. The abdominal pain began about 3 weeks ago. It is intermittent and feels like a twisting upward pressure in both her lower and upper abdomen. Pt rates the severity to be a 6/10. Pt also began feeling nauseous at 17:00 yesterday evening after work. Pt states that she has vomited 9-10x in the past 24 hours and is not able to keep any food or drink down. She has not eaten anything since 11:00 this morning. Pt has not taken anything to relieve the symptoms of abdominal pain or nausea. Pt also endorses headaches that are new since the onset of pregnancy that she ranks to be 5/10 in severity. Tylenol helps alleviate the pain. Pt states that she feels dizzy  At times and gets short of breath and chills easily and associates those symptoms with her vomiting episodes and does not currently feel any of these symptoms right now. Pt reports a PMH of anemia and has run out of her Fe supplementation 2 weeks ago. Pt denies any  Fevers, vaginal bleeding or discharge and UTI symptoms. Pt states that she has had one episode of diarrhea last night but it does not concern her today. [facility]   Past Medical History  Diagnosis Date  . Low iron   . Anemia     Past Surgical History  Procedure Laterality Date  . No past surgeries      Family History  Problem Relation Age of Onset  . Alcohol abuse Neg Hx   . Arthritis Neg Hx   . Asthma Neg Hx   . Birth defects Neg Hx   . Cancer Neg Hx   . COPD Neg Hx   . Depression Neg Hx   . Diabetes Neg Hx   . Drug abuse Neg Hx   . Early death Neg Hx   . Hearing  loss Neg Hx   . Heart disease Neg Hx   . Hyperlipidemia Neg Hx   . Hypertension Neg Hx   . Kidney disease Neg Hx   . Learning disabilities Neg Hx   . Mental illness Neg Hx   . Mental retardation Neg Hx   . Miscarriages / Stillbirths Neg Hx   . Stroke Neg Hx   . Vision loss Neg Hx   . Varicose Veins Neg Hx     History  Substance Use Topics  . Smoking status: Never Smoker   . Smokeless tobacco: Never Used  . Alcohol Use: No    Allergies: No Known Allergies  Prescriptions prior to admission  Medication Sig Dispense Refill  . acetaminophen (TYLENOL) 325 MG tablet Take 325 mg by mouth every 6 (six) hours as needed for headache.      . ferrous sulfate 325 (65 FE) MG tablet Take 325 mg by mouth daily with breakfast.      . Prenatal Vit-Fe Fumarate-FA (PRENATAL MULTIVITAMIN) TABS tablet Take 1 tablet by mouth daily at 12 noon.        Review of Systems  Constitutional: Positive for chills. Negative for fever.  Respiratory: Negative for shortness of breath (but does endorse "weird breathing" during vomiting episodes).   Cardiovascular: Negative for chest pain.  Gastrointestinal: Positive for nausea (constant), vomiting (9-10x in last 24 hours), abdominal pain (upper and lower abdominal pain and pressure at 6/10 pain) and diarrhea. Negative for heartburn and constipation.  Genitourinary: Negative for dysuria, urgency, frequency and hematuria.       Negative for vaginal bleeding and discharge  Neurological: Positive for headaches (5/10 since pregnancy ). Negative for dizziness.   Physical Exam   Blood pressure 101/69, pulse 108, temperature 99.8 F (37.7 C), temperature source Oral, resp. rate 16, height 5' 5.5" (1.664 m), weight 65.681 kg (144 lb 12.8 oz), last menstrual period 03/22/2013, SpO2 100.00%.  Physical Exam  Constitutional: She is oriented to person, place, and time. She appears well-developed and well-nourished. No distress.  Cardiovascular: Regular rhythm and normal  heart sounds.  Tachycardia present.   Respiratory: Effort normal and breath sounds normal.  GI: Soft. Bowel sounds are normal. She exhibits no distension. There is tenderness (mild discomfort in LLQ and suprapubic area to deep palpation). There is no rebound and no guarding.  Neurological: She is alert and oriented to person, place, and time.  Skin: Skin is warm and dry.  Psychiatric: She has a normal mood and affect. Her behavior is normal.     MAU Course  Procedures  MDM Gave Zofran 8 mg disintegrating tablet PO, one time dose > reports relief > no more episodes of vomiting.    Results for orders placed during the hospital encounter of 08/08/13 (from the past 24 hour(s))  URINALYSIS, ROUTINE W REFLEX MICROSCOPIC     Status: Abnormal   Collection Time    08/08/13  6:08 PM      Result Value Ref Range   Color, Urine YELLOW  YELLOW   APPearance HAZY (*) CLEAR   Specific Gravity, Urine 1.010  1.005 - 1.030   pH 7.0  5.0 - 8.0   Glucose, UA NEGATIVE  NEGATIVE mg/dL   Hgb urine dipstick NEGATIVE  NEGATIVE   Bilirubin Urine NEGATIVE  NEGATIVE   Ketones, ur 40 (*) NEGATIVE mg/dL   Protein, ur NEGATIVE  NEGATIVE mg/dL   Urobilinogen, UA 1.0  0.0 - 1.0 mg/dL   Nitrite NEGATIVE  NEGATIVE   Leukocytes, UA SMALL (*) NEGATIVE  URINE MICROSCOPIC-ADD ON     Status: Abnormal   Collection Time    08/08/13  6:08 PM      Result Value Ref Range   Squamous Epithelial / LPF MANY (*) RARE   WBC, UA 3-6  <3 WBC/hpf   RBC / HPF 0-2  <3 RBC/hpf   Bacteria, UA FEW (*) RARE   Urine-Other TRICHOMONAS PRESENT       Assessment and Plan  Trichomoniasis Gastroenteritis  Plan: 2 GM Flagyl in MAU Advised partner treatment GC/CT pending. Increase fluids RX Zofran 4 mg PO q 8 hrs (#15) Report if no improvement or worsening of symptoms.  Eino FarberWalidah Paul HalfN Muhammad, CNM

## 2013-08-08 NOTE — MAU Note (Signed)
Patient states shea started having vomiting, diarrhea, abdominal pain and chills yesterday. Diarrhea has stopped but has continued to have vomiting and abdominal pain.

## 2013-08-09 LAB — GC/CHLAMYDIA PROBE AMP
CT Probe RNA: NEGATIVE
GC Probe RNA: NEGATIVE

## 2013-08-10 ENCOUNTER — Encounter: Payer: Self-pay | Admitting: Family Medicine

## 2013-08-11 NOTE — MAU Provider Note (Signed)

## 2013-08-23 ENCOUNTER — Encounter: Payer: Self-pay | Admitting: Obstetrics and Gynecology

## 2013-08-23 ENCOUNTER — Ambulatory Visit (INDEPENDENT_AMBULATORY_CARE_PROVIDER_SITE_OTHER): Payer: Medicaid Other | Admitting: Obstetrics and Gynecology

## 2013-08-23 VITALS — BP 116/70 | HR 113 | Temp 97.8°F | Wt 147.3 lb

## 2013-08-23 DIAGNOSIS — O99012 Anemia complicating pregnancy, second trimester: Principal | ICD-10-CM

## 2013-08-23 DIAGNOSIS — D509 Iron deficiency anemia, unspecified: Secondary | ICD-10-CM

## 2013-08-23 DIAGNOSIS — O99019 Anemia complicating pregnancy, unspecified trimester: Secondary | ICD-10-CM

## 2013-08-23 LAB — POCT URINALYSIS DIP (DEVICE)
Bilirubin Urine: NEGATIVE
Glucose, UA: NEGATIVE mg/dL
Hgb urine dipstick: NEGATIVE
Ketones, ur: NEGATIVE mg/dL
Nitrite: NEGATIVE
Protein, ur: NEGATIVE mg/dL
Specific Gravity, Urine: 1.02 (ref 1.005–1.030)
Urobilinogen, UA: 0.2 mg/dL (ref 0.0–1.0)
pH: 7 (ref 5.0–8.0)

## 2013-08-23 NOTE — Patient Instructions (Signed)
Second Trimester of Pregnancy The second trimester is from week 13 through week 28, months 4 through 6. The second trimester is often a time when you feel your best. Your body has also adjusted to being pregnant, and you begin to feel better physically. Usually, morning sickness has lessened or quit completely, you may have more energy, and you may have an increase in appetite. The second trimester is also a time when the fetus is growing rapidly. At the end of the sixth month, the fetus is about 9 inches long and weighs about 1 pounds. You will likely begin to feel the baby move (quickening) between 18 and 20 weeks of the pregnancy. BODY CHANGES Your body goes through many changes during pregnancy. The changes vary from woman to woman.   Your weight will continue to increase. You will notice your lower abdomen bulging out.  You may begin to get stretch marks on your hips, abdomen, and breasts.  You may develop headaches that can be relieved by medicines approved by your health care provider.  You may urinate more often because the fetus is pressing on your bladder.  You may develop or continue to have heartburn as a result of your pregnancy.  You may develop constipation because certain hormones are causing the muscles that push waste through your intestines to slow down.  You may develop hemorrhoids or swollen, bulging veins (varicose veins).  You may have back pain because of the weight gain and pregnancy hormones relaxing your joints between the bones in your pelvis and as a result of a shift in weight and the muscles that support your balance.  Your breasts will continue to grow and be tender.  Your gums may bleed and may be sensitive to brushing and flossing.  Dark spots or blotches (chloasma, mask of pregnancy) may develop on your face. This will likely fade after the baby is born.  A dark line from your belly button to the pubic area (linea nigra) may appear. This will likely fade  after the baby is born.  You may have changes in your hair. These can include thickening of your hair, rapid growth, and changes in texture. Some women also have hair loss during or after pregnancy, or hair that feels dry or thin. Your hair will most likely return to normal after your baby is born. WHAT TO EXPECT AT YOUR PRENATAL VISITS During a routine prenatal visit:  You will be weighed to make sure you and the fetus are growing normally.  Your blood pressure will be taken.  Your abdomen will be measured to track your baby's growth.  The fetal heartbeat will be listened to.  Any test results from the previous visit will be discussed. Your health care provider may ask you:  How you are feeling.  If you are feeling the baby move.  If you have had any abnormal symptoms, such as leaking fluid, bleeding, severe headaches, or abdominal cramping.  If you have any questions. Other tests that may be performed during your second trimester include:  Blood tests that check for:  Low iron levels (anemia).  Gestational diabetes (between 24 and 28 weeks).  Rh antibodies.  Urine tests to check for infections, diabetes, or protein in the urine.  An ultrasound to confirm the proper growth and development of the baby.  An amniocentesis to check for possible genetic problems.  Fetal screens for spina bifida and Down syndrome. HOME CARE INSTRUCTIONS   Avoid all smoking, herbs, alcohol, and unprescribed   drugs. These chemicals affect the formation and growth of the baby.  Follow your health care provider's instructions regarding medicine use. There are medicines that are either safe or unsafe to take during pregnancy.  Exercise only as directed by your health care provider. Experiencing uterine cramps is a good sign to stop exercising.  Continue to eat regular, healthy meals.  Wear a good support bra for breast tenderness.  Do not use hot tubs, steam rooms, or saunas.  Wear your  seat belt at all times when driving.  Avoid raw meat, uncooked cheese, cat litter boxes, and soil used by cats. These carry germs that can cause birth defects in the baby.  Take your prenatal vitamins.  Try taking a stool softener (if your health care provider approves) if you develop constipation. Eat more high-fiber foods, such as fresh vegetables or fruit and whole grains. Drink plenty of fluids to keep your urine clear or pale yellow.  Take warm sitz baths to soothe any pain or discomfort caused by hemorrhoids. Use hemorrhoid cream if your health care provider approves.  If you develop varicose veins, wear support hose. Elevate your feet for 15 minutes, 3-4 times a day. Limit salt in your diet.  Avoid heavy lifting, wear low heel shoes, and practice good posture.  Rest with your legs elevated if you have leg cramps or low back pain.  Visit your dentist if you have not gone yet during your pregnancy. Use a soft toothbrush to brush your teeth and be gentle when you floss.  A sexual relationship may be continued unless your health care provider directs you otherwise.  Continue to go to all your prenatal visits as directed by your health care provider. SEEK MEDICAL CARE IF:   You have dizziness.  You have mild pelvic cramps, pelvic pressure, or nagging pain in the abdominal area.  You have persistent nausea, vomiting, or diarrhea.  You have a bad smelling vaginal discharge.  You have pain with urination. SEEK IMMEDIATE MEDICAL CARE IF:   You have a fever.  You are leaking fluid from your vagina.  You have spotting or bleeding from your vagina.  You have severe abdominal cramping or pain.  You have rapid weight gain or loss.  You have shortness of breath with chest pain.  You notice sudden or extreme swelling of your face, hands, ankles, feet, or legs.  You have not felt your baby move in over an hour.  You have severe headaches that do not go away with  medicine.  You have vision changes. Document Released: 02/03/2001 Document Revised: 02/14/2013 Document Reviewed: 04/12/2012 ExitCare Patient Information 2015 [employer]. This information is not intended to replace advice given to you by your health care provider. Make sure you discuss any questions you have with your health care provider.  

## 2013-08-23 NOTE — Progress Notes (Signed)
Doing well. No UTI sx. Has not voided yet. No real PICA, eats ice on occasion. Taking iron supplement. Advised increased iron foods.

## 2013-09-05 ENCOUNTER — Encounter: Payer: Self-pay | Admitting: Family Medicine

## 2013-09-20 ENCOUNTER — Encounter: Payer: Medicaid Other | Admitting: Advanced Practice Midwife

## 2013-09-21 ENCOUNTER — Ambulatory Visit (INDEPENDENT_AMBULATORY_CARE_PROVIDER_SITE_OTHER): Payer: Medicaid Other | Admitting: Family

## 2013-09-21 VITALS — BP 121/73 | HR 87 | Temp 98.0°F | Wt 156.0 lb

## 2013-09-21 DIAGNOSIS — O99019 Anemia complicating pregnancy, unspecified trimester: Secondary | ICD-10-CM

## 2013-09-21 DIAGNOSIS — Z3400 Encounter for supervision of normal first pregnancy, unspecified trimester: Secondary | ICD-10-CM

## 2013-09-21 DIAGNOSIS — O99011 Anemia complicating pregnancy, first trimester: Secondary | ICD-10-CM

## 2013-09-21 DIAGNOSIS — Z23 Encounter for immunization: Secondary | ICD-10-CM

## 2013-09-21 DIAGNOSIS — D649 Anemia, unspecified: Secondary | ICD-10-CM

## 2013-09-21 DIAGNOSIS — Z3402 Encounter for supervision of normal first pregnancy, second trimester: Secondary | ICD-10-CM

## 2013-09-21 LAB — CBC
HCT: 28.5 % — ABNORMAL LOW (ref 36.0–46.0)
Hemoglobin: 9.6 g/dL — ABNORMAL LOW (ref 12.0–15.0)
MCH: 24.6 pg — ABNORMAL LOW (ref 26.0–34.0)
MCHC: 33.7 g/dL (ref 30.0–36.0)
MCV: 72.9 fL — ABNORMAL LOW (ref 78.0–100.0)
Platelets: 237 10*3/uL (ref 150–400)
RBC: 3.91 MIL/uL (ref 3.87–5.11)
RDW: 15.2 % (ref 11.5–15.5)
WBC: 9.1 10*3/uL (ref 4.0–10.5)

## 2013-09-21 LAB — POCT URINALYSIS DIP (DEVICE)
Bilirubin Urine: NEGATIVE
Glucose, UA: NEGATIVE mg/dL
Hgb urine dipstick: NEGATIVE
Ketones, ur: NEGATIVE mg/dL
Nitrite: NEGATIVE
Protein, ur: NEGATIVE mg/dL
Specific Gravity, Urine: 1.01 (ref 1.005–1.030)
Urobilinogen, UA: 0.2 mg/dL (ref 0.0–1.0)
pH: 7 (ref 5.0–8.0)

## 2013-09-21 MED ORDER — HYDROXYZINE HCL 25 MG PO TABS: 25.0000 mg | ORAL_TABLET | Freq: Four times a day (QID) | ORAL | Status: DC | PRN

## 2013-09-21 MED ORDER — TETANUS-DIPHTH-ACELL PERTUSSIS 5-2.5-18.5 LF-MCG/0.5 IM SUSP: 0.5000 mL | Freq: Once | INTRAMUSCULAR | Status: DC

## 2013-09-21 NOTE — Progress Notes (Signed)
Reports itching on legs and arms.  No report of fever or rash.  Denies itching on palms of hands or soles of feet. RX vistaril.  Third trimester labs today.

## 2013-09-21 NOTE — Progress Notes (Signed)
Feels like itching all over her body, but especially her legs. States it feels like something crawling up her veins.

## 2013-09-22 ENCOUNTER — Encounter: Payer: Self-pay | Admitting: Family

## 2013-09-22 LAB — GLUCOSE TOLERANCE, 1 HOUR (50G) W/O FASTING: Glucose, 1 Hour GTT: 85 mg/dL (ref 70–140)

## 2013-09-22 LAB — RPR

## 2013-09-22 LAB — HIV ANTIBODY (ROUTINE TESTING W REFLEX): HIV 1&2 Ab, 4th Generation: NONREACTIVE

## 2013-10-05 ENCOUNTER — Ambulatory Visit (INDEPENDENT_AMBULATORY_CARE_PROVIDER_SITE_OTHER): Payer: Medicaid Other | Admitting: Obstetrics & Gynecology

## 2013-10-05 VITALS — BP 101/61 | HR 83 | Wt 156.4 lb

## 2013-10-05 DIAGNOSIS — O99019 Anemia complicating pregnancy, unspecified trimester: Secondary | ICD-10-CM

## 2013-10-05 DIAGNOSIS — D649 Anemia, unspecified: Secondary | ICD-10-CM

## 2013-10-05 DIAGNOSIS — O99011 Anemia complicating pregnancy, first trimester: Secondary | ICD-10-CM

## 2013-10-05 LAB — POCT URINALYSIS DIP (DEVICE)
Bilirubin Urine: NEGATIVE
Glucose, UA: NEGATIVE mg/dL
Ketones, ur: NEGATIVE mg/dL
Nitrite: NEGATIVE
Protein, ur: NEGATIVE mg/dL
Specific Gravity, Urine: 1.02 (ref 1.005–1.030)
Urobilinogen, UA: 1 mg/dL (ref 0.0–1.0)
pH: 6 (ref 5.0–8.0)

## 2013-10-05 NOTE — Progress Notes (Signed)
nsg note reviewed, will try OTC chewable vitamins, no reflux sx.

## 2013-10-05 NOTE — Progress Notes (Signed)
Pt is concerned about prenatal vitamin/iron supplement causing nausea/vomiting, can she take gummies. Educated about taking 2 [place] vitamins instead of prenatal vitamin.

## 2013-10-05 NOTE — Patient Instructions (Signed)
Pregnancy and Anemia Anemia is a condition in which the concentration of red blood cells or hemoglobin in the blood is below normal. Hemoglobin is a substance in red blood cells that carries oxygen to the tissues of the body. Anemia results in not enough oxygen reaching these tissues.  Anemia during pregnancy is common because the fetus uses more iron and folic acid as it is developing. Your body may not produce enough red blood cells because of this. Also, during pregnancy, the liquid part of the blood (plasma) increases by about 50%, and the red blood cells increase by only 25%. This lowers the concentration of the red blood cells and creates a natural anemia-like situation.  CAUSES  The most common cause of anemia during pregnancy is not having enough iron in the body to make red blood cells (iron deficiency anemia). Other causes may include:  Folic acid deficiency.  Vitamin B12 deficiency.  Certain prescription or over-the-counter medicines.  Certain medical conditions or infections that destroy red blood cells.  A low platelet count and bleeding caused by antibodies that go through the placenta to the fetus from the mother's blood. SIGNS AND SYMPTOMS  Mild anemia may not be noticeable. If it becomes severe, symptoms may include:  Tiredness.  Shortness of breath, especially with exercise.  Weakness.  Fainting.  Pale looking skin.  Headaches.  Feeling a fast or irregular heartbeat (palpitations). DIAGNOSIS  The type of anemia is usually diagnosed from your family and medical history and blood tests. TREATMENT  Treatment of anemia during pregnancy depends on the cause of the anemia. Treatment can include:  Supplements of iron, vitamin B12, or folic acid.  A blood transfusion. This may be needed if blood loss is severe.  Hospitalization. This may be needed if there is significant continual blood loss.  Dietary changes. HOME CARE INSTRUCTIONS   Follow your dietitian's or  health care provider's dietary recommendations.  Increase your vitamin C intake. This will help the stomach absorb more iron.  Eat a diet rich in iron. This would include foods such as:  Liver.  Beef.  Whole grain bread.  Eggs.  Dried fruit.  Take iron and vitamins as directed by your health care provider.  Eat green leafy vegetables. These are a good source of folic acid. SEEK MEDICAL CARE IF:   You have frequent or lasting headaches.  You are looking pale.  You are bruising easily. SEEK IMMEDIATE MEDICAL CARE IF:   You have extreme weakness, shortness of breath, or chest pain.  You become dizzy or have trouble concentrating.  You have heavy vaginal bleeding.  You develop a rash.  You have bloody or black, tarry stools.  You faint.  You vomit up blood.  You vomit repeatedly.  You have abdominal pain.  You have a fever or persistent symptoms for more than 2-3 days.  You have a fever and your symptoms suddenly get worse.  You are dehydrated. MAKE SURE YOU:   Understand these instructions.  Will watch your condition.  Will get help right away if you are not doing well or get worse. Document Released: 02/07/2000 Document Revised: 11/30/2012 Document Reviewed: 09/21/2012 ExitCare Patient Information 2015 [employer]. This information is not intended to replace advice given to you by your health care provider. Make sure you discuss any questions you have with your health care provider.  

## 2013-10-20 ENCOUNTER — Ambulatory Visit (INDEPENDENT_AMBULATORY_CARE_PROVIDER_SITE_OTHER): Payer: Medicaid Other | Admitting: Obstetrics and Gynecology

## 2013-10-20 ENCOUNTER — Encounter: Payer: Self-pay | Admitting: Obstetrics and Gynecology

## 2013-10-20 VITALS — BP 108/65 | HR 97 | Temp 98.6°F

## 2013-10-20 DIAGNOSIS — O99019 Anemia complicating pregnancy, unspecified trimester: Secondary | ICD-10-CM

## 2013-10-20 DIAGNOSIS — O99011 Anemia complicating pregnancy, first trimester: Secondary | ICD-10-CM

## 2013-10-20 DIAGNOSIS — Z3400 Encounter for supervision of normal first pregnancy, unspecified trimester: Secondary | ICD-10-CM

## 2013-10-20 DIAGNOSIS — D649 Anemia, unspecified: Secondary | ICD-10-CM

## 2013-10-20 DIAGNOSIS — Z3403 Encounter for supervision of normal first pregnancy, third trimester: Secondary | ICD-10-CM

## 2013-10-20 NOTE — Progress Notes (Signed)
Pregnancy discomforts discussed.  Hgb 9.5. Has ice PICA and not taking vits/iron qd. Will start PNV and at least 1 iron tab/day.  Iron rich diet discussed. Advised PN classes. Circ info given.

## 2013-10-20 NOTE — Patient Instructions (Signed)
Iron-Rich Diet An iron-rich diet contains foods that are good sources of iron. Iron is an important mineral that helps your body produce hemoglobin. Hemoglobin is a protein in red blood cells that carries oxygen to the body's tissues. Sometimes, the iron level in your blood can be low. This may be caused by:  A lack of iron in your diet.  Blood loss.  Times of growth, such as during pregnancy or during a child's growth and development. Low levels of iron can cause a decrease in the number of red blood cells. This can result in iron deficiency anemia. Iron deficiency anemia symptoms include:  Tiredness.  Weakness.  Irritability.  Increased chance of infection. Here are some recommendations for daily iron intake:  Males older than 19 years of age need 8 mg of iron per day.  Women ages 19 to 50 need 18 mg of iron per day.  Pregnant women need 27 mg of iron per day, and women who are over 19 years of age and breastfeeding need 9 mg of iron per day.  Women over the age of 50 need 8 mg of iron per day. SOURCES OF IRON There are 2 types of iron that are found in food: heme iron and nonheme iron. Heme iron is absorbed by the body better than nonheme iron. Heme iron is found in meat, poultry, and fish. Nonheme iron is found in grains, beans, and vegetables. Heme Iron Sources Food / Iron (mg)  Chicken liver, 3 oz (85 g)/ 10 mg  Beef liver, 3 oz (85 g)/ 5.5 mg  Oysters, 3 oz (85 g)/ 8 mg  Beef, 3 oz (85 g)/ 2 to 3 mg  Shrimp, 3 oz (85 g)/ 2.8 mg  Turkey, 3 oz (85 g)/ 2 mg  Chicken, 3 oz (85 g) / 1 mg  Fish (tuna, halibut), 3 oz (85 g)/ 1 mg  Pork, 3 oz (85 g)/ 0.9 mg Nonheme Iron Sources Food / Iron (mg)  Ready-to-eat breakfast cereal, iron-fortified / 3.9 to 7 mg  Tofu,  cup / 3.4 mg  Kidney beans,  cup / 2.6 mg  Baked potato with skin / 2.7 mg  Asparagus,  cup / 2.2 mg  Avocado / 2 mg  Dried peaches,  cup / 1.6 mg  Raisins,  cup / 1.5 mg  Soy milk, 1 cup  / 1.5 mg  Whole-wheat bread, 1 slice / 1.2 mg  Spinach, 1 cup / 0.8 mg  Broccoli,  cup / 0.6 mg IRON ABSORPTION Certain foods can decrease the body's absorption of iron. Try to avoid these foods and beverages while eating meals with iron-containing foods:  Coffee.  Tea.  Fiber.  Soy. Foods containing vitamin C can help increase the amount of iron your body absorbs from iron sources, especially from nonheme sources. Eat foods with vitamin C along with iron-containing foods to increase your iron absorption. Foods that are high in vitamin C include many fruits and vegetables. Some good sources are:  Fresh orange juice.  Oranges.  Strawberries.  Mangoes.  Grapefruit.  Red bell peppers.  Green bell peppers.  Broccoli.  Potatoes with skin.  Tomato juice. Document Released: 09/23/2004 Document Revised: 05/04/2011 Document Reviewed: 07/31/2010 ExitCare Patient Information 2015 [employer]. This information is not intended to replace advice given to you by your health care provider. Make sure you discuss any questions you have with your health care provider.  

## 2013-10-20 NOTE — Progress Notes (Signed)
Edema-feet  Pt c/o chest pain x 1 yesterday while lying on the couch.  Did not occur again

## 2013-10-31 ENCOUNTER — Inpatient Hospital Stay (HOSPITAL_COMMUNITY)
Admission: AD | Admit: 2013-10-31 | Discharge: 2013-10-31 | Discharge status: Against Medical Advice | Payer: Medicaid Other | Source: Ambulatory Visit | Attending: Family Medicine | Admitting: Family Medicine

## 2013-10-31 NOTE — MAU Note (Signed)
Not in lobby

## 2013-11-08 ENCOUNTER — Ambulatory Visit (INDEPENDENT_AMBULATORY_CARE_PROVIDER_SITE_OTHER): Payer: Medicaid Other | Admitting: Physician Assistant

## 2013-11-08 VITALS — BP 111/57 | HR 82 | Temp 98.9°F | Wt 162.9 lb

## 2013-11-08 DIAGNOSIS — O99011 Anemia complicating pregnancy, first trimester: Secondary | ICD-10-CM

## 2013-11-08 DIAGNOSIS — O99019 Anemia complicating pregnancy, unspecified trimester: Secondary | ICD-10-CM | POA: Diagnosis not present

## 2013-11-08 DIAGNOSIS — Z23 Encounter for immunization: Secondary | ICD-10-CM

## 2013-11-08 DIAGNOSIS — Z3400 Encounter for supervision of normal first pregnancy, unspecified trimester: Secondary | ICD-10-CM | POA: Diagnosis not present

## 2013-11-08 DIAGNOSIS — D649 Anemia, unspecified: Secondary | ICD-10-CM | POA: Diagnosis not present

## 2013-11-08 DIAGNOSIS — Z3403 Encounter for supervision of normal first pregnancy, third trimester: Secondary | ICD-10-CM

## 2013-11-08 LAB — POCT URINALYSIS DIP (DEVICE)
Bilirubin Urine: NEGATIVE
Glucose, UA: NEGATIVE mg/dL
Hgb urine dipstick: NEGATIVE
Ketones, ur: NEGATIVE mg/dL
Nitrite: NEGATIVE
Protein, ur: NEGATIVE mg/dL
Specific Gravity, Urine: 1.015 (ref 1.005–1.030)
Urobilinogen, UA: 0.2 mg/dL (ref 0.0–1.0)
pH: 6.5 (ref 5.0–8.0)

## 2013-11-08 NOTE — Progress Notes (Signed)
32 weeks, no complaints.  Denies dysuria, vag bleeding, LOF.  Reports good fetal movement.   She does report eating deodorant.  She scrapes the top off with her teeth.  This has been ongoing for one month and she has not yet finished one full stick.  She sees [facility] but has not told them about eating deodorant.  They have advised her to take iron supplements.  She is taking them "once in a blue moon."  They make her feel nauseated.   A: pica in pregnancy P: RTC 2 weeks  Flu shot today.  Diet and vitamins/supplements discussed extensively.   Ped list given.

## 2013-11-08 NOTE — Progress Notes (Signed)
Discussed flu vaccine-- patient would like to get today.  

## 2013-11-10 LAB — URINE CULTURE: Colony Count: 40000

## 2013-11-17 ENCOUNTER — Encounter: Payer: Self-pay | Admitting: Family Medicine

## 2013-11-22 ENCOUNTER — Encounter: Payer: Medicaid Other | Admitting: Advanced Practice Midwife

## 2013-11-24 ENCOUNTER — Inpatient Hospital Stay (HOSPITAL_COMMUNITY)
Admission: AD | Admit: 2013-11-24 | Discharge: 2013-11-24 | Disposition: A | Discharge status: Home / Self Care | Payer: Medicaid Other | Source: Ambulatory Visit | Attending: Obstetrics & Gynecology | Admitting: Obstetrics & Gynecology

## 2013-11-24 ENCOUNTER — Encounter (HOSPITAL_COMMUNITY): Payer: Self-pay | Admitting: *Deleted

## 2013-11-24 DIAGNOSIS — Z3A35 35 weeks gestation of pregnancy: Secondary | ICD-10-CM | POA: Insufficient documentation

## 2013-11-24 DIAGNOSIS — O9989 Other specified diseases and conditions complicating pregnancy, childbirth and the puerperium: Secondary | ICD-10-CM | POA: Diagnosis not present

## 2013-11-24 DIAGNOSIS — K047 Periapical abscess without sinus: Secondary | ICD-10-CM | POA: Insufficient documentation

## 2013-11-24 DIAGNOSIS — R6884 Jaw pain: Secondary | ICD-10-CM | POA: Diagnosis present

## 2013-11-24 DIAGNOSIS — A599 Trichomoniasis, unspecified: Secondary | ICD-10-CM

## 2013-11-24 MED ORDER — OXYCODONE-ACETAMINOPHEN 5-325 MG PO TABS: 1.0000 | ORAL_TABLET | ORAL | Status: DC | PRN

## 2013-11-24 MED ORDER — CLINDAMYCIN HCL 300 MG PO CAPS: 300.0000 mg | ORAL_CAPSULE | Freq: Three times a day (TID) | ORAL | Status: DC

## 2013-11-24 MED ORDER — ACETAMINOPHEN 325 MG PO TABS: 650.0000 mg | ORAL_TABLET | Freq: Four times a day (QID) | ORAL | Status: DC | PRN

## 2013-11-24 NOTE — MAU Note (Signed)
Patient requesting work note for tomorrow. 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 Note given to patient

## 2013-11-24 NOTE — MAU Provider Note (Signed)
History     CSN: [id number]  Arrival date and time: 11/24/13 1512   First Provider Initiated Contact with Patient 11/24/13 1546     CC: Right jaw swelling and pain  HPI  Anita DarterKarla Francis is a 18 y.o. G1P0 at 259w2d by LMP who presents with worsening, right jaw pain and swelling for the past 2 days. Pain is described as throbbing and aching. Pain and swelling prevents her from eating.  Denies drooling or difficulty breathing. No known precipitating event. No fever, some chills. No nausea or vomiting.  Denies contractions, vaginal bleeding, and LOF. Reports good fetal movements.   OB History   Grav Para Term Preterm Abortions TAB SAB Ect Mult Living   1               Past Medical History  Diagnosis Date  . Low iron   . Anemia     Past Surgical History  Procedure Laterality Date  . No past surgeries      Family History  Problem Relation Age of Onset  . Alcohol abuse Neg Hx   . Arthritis Neg Hx   . Asthma Neg Hx   . Birth defects Neg Hx   . Cancer Neg Hx   . COPD Neg Hx   . Depression Neg Hx   . Diabetes Neg Hx   . Drug abuse Neg Hx   . Early death Neg Hx   . Hearing loss Neg Hx   . Heart disease Neg Hx   . Hyperlipidemia Neg Hx   . Hypertension Neg Hx   . Kidney disease Neg Hx   . Learning disabilities Neg Hx   . Mental illness Neg Hx   . Mental retardation Neg Hx   . Miscarriages / Stillbirths Neg Hx   . Stroke Neg Hx   . Vision loss Neg Hx   . Varicose Veins Neg Hx     History  Substance Use Topics  . Smoking status: Never Smoker   . Smokeless tobacco: Never Used  . Alcohol Use: No    Allergies: No Known Allergies  Facility-administered medications prior to admission  Medication Dose Route Frequency Provider Last Rate Last Dose  . Tdap (BOOSTRIX) injection 0.5 mL  0.5 mL Intramuscular Once Walidah N Karim, CNM       Prescriptions prior to admission  Medication Sig Dispense Refill  . acetaminophen (TYLENOL) 325 MG tablet Take 650 mg by mouth every  6 (six) hours as needed for mild pain or headache.       . ferrous sulfate 325 (65 FE) MG tablet Take 325 mg by mouth daily with breakfast.      . Prenatal Vit-Fe Fumarate-FA (PRENATAL MULTIVITAMIN) TABS tablet Take 1 tablet by mouth daily at 12 noon.        Review of Systems  All other systems reviewed and are negative.  Physical Exam   Blood pressure 107/63, pulse 147, temperature 99.7 F (37.6 C), temperature source Oral, resp. rate 20, height 5\' 5"  (1.651 m), weight 74.753 kg (164 lb 12.8 oz), last menstrual period 03/22/2013.  Physical Exam  Constitutional: She is oriented to person, place, and time. She appears well-developed and well-nourished.  HENT:  Mouth/Throat: Oropharynx is clear and moist. Dental caries present. Dental abscesses: Right lower molars.  Significant dental carries in upper and lower teeth. Right lower gingiva with significant erythema and edema. No frank purulence noted.  Eyes: Pupils are equal, round, and reactive to light.  Neck: Normal range  of motion.  Cardiovascular: Normal rate and regular rhythm.   Respiratory: Effort normal and breath sounds normal. She has no wheezes.  Musculoskeletal: She exhibits no edema.  Neurological: She is alert and oriented to person, place, and time.  Skin: Skin is warm and dry.   FHT:  FHR: 165 bpm, variability: mod,  accelerations:  present,  decelerations:  none  Procedures: None  Assessment and Plan  A: 18yo G1P0 @ [redacted]w[redacted]d with odontogenic infection. FHT reassuring.  P: clindamycin, tylenol prn pain, number given to clinic to call and ask for dental referral.   Anita Robinson 11/24/2013, 4:04 PM   OB fellow attestation:  I have seen and examined this patient; I agree with above documentation in the resident's note.   Anita Robinson is a 18 y.o. G1P0 reporting Right jaw pain and swelling x 2 days +FM, denies LOF, VB, contractions, vaginal discharge.  PE: BP 109/65  Pulse 128  Temp(Src) 99.7 F (37.6 C) (Oral)   Resp 20  Ht 5\' 5"  (1.651 m)  Wt 164 lb 12.8 oz (74.753 kg)  BMI 27.42 kg/m2  LMP 03/22/2013 Gen: calm comfortable, NAD Resp: normal effort, no distress Abd: gravid  ROS, labs, PMH reviewed NST reactive  Plan: - fetal kick counts reinforced, preterm labor precautions - rx percocet 5/325mg  #15, clindamycin 300mg  TID, f/u with dentist.  Advised she will not receive any more refills of narcotics. - continue routine follow up in OB clinic  Perry Mount, MD 5:19 PM

## 2013-11-24 NOTE — MAU Note (Signed)
Pt reports she started noticing her gums where swelling 2 days ago. Got worse today. Her whole right side of her face is swollen and c/o sore throat.

## 2013-11-24 NOTE — Discharge Instructions (Signed)
Abscessed Tooth An abscessed tooth is an infection around your tooth. It may be caused by holes or damage to the tooth (cavity) or a dental disease. An abscessed tooth causes mild to very bad pain in and around the tooth. See your dentist right away if you have tooth or gum pain. HOME CARE  Take your medicine as told. Finish it even if you start to feel better.  Do not drive after taking pain medicine.  Rinse your mouth (gargle) often with salt water ( teaspoon salt in 8 ounces of warm water).  Do not apply heat to the outside of your face. GET HELP RIGHT AWAY IF:   You have a temperature by mouth above 102 F (38.9 C), not controlled by medicine.  You have chills and a very bad headache.  You have problems breathing or swallowing.  Your mouth will not open.  You develop puffiness (swelling) on the neck or around the eye.  Your pain is not helped by medicine.  Your pain is getting worse instead of better. MAKE SURE YOU:   Understand these instructions.  Will watch your condition.  Will get help right away if you are not doing well or get worse. Document Released: 07/29/2007 Document Revised: 05/04/2011 Document Reviewed: 05/20/2010 ExitCare Patient Information 2015 [employer]. This information is not intended to replace advice given to you by your health care provider. Make sure you discuss any questions you have with your health care provider.  

## 2013-11-29 ENCOUNTER — Encounter: Payer: Self-pay | Admitting: Obstetrics and Gynecology

## 2013-11-29 ENCOUNTER — Ambulatory Visit (INDEPENDENT_AMBULATORY_CARE_PROVIDER_SITE_OTHER): Payer: Medicaid Other | Admitting: Obstetrics and Gynecology

## 2013-11-29 VITALS — BP 110/67 | HR 83 | Temp 98.6°F | Wt 166.5 lb

## 2013-11-29 DIAGNOSIS — Z3493 Encounter for supervision of normal pregnancy, unspecified, third trimester: Secondary | ICD-10-CM

## 2013-11-29 LAB — POCT URINALYSIS DIP (DEVICE)
Bilirubin Urine: NEGATIVE
Glucose, UA: NEGATIVE mg/dL
Hgb urine dipstick: NEGATIVE
Ketones, ur: NEGATIVE mg/dL
Nitrite: NEGATIVE
Protein, ur: NEGATIVE mg/dL
Specific Gravity, Urine: 1.02 (ref 1.005–1.030)
Urobilinogen, UA: 0.2 mg/dL (ref 0.0–1.0)
pH: 6.5 (ref 5.0–8.0)

## 2013-11-29 LAB — OB RESULTS CONSOLE GC/CHLAMYDIA
Chlamydia: NEGATIVE
Gonorrhea: NEGATIVE

## 2013-11-29 NOTE — Addendum Note (Signed)
Addended by: Aldona LentoFISHER, Leilanni Halvorson L on: 11/29/2013 08:52 AM   Modules accepted: Orders

## 2013-11-29 NOTE — Progress Notes (Signed)
Pt is concerned about discharge. GBS/GC/Chlamydia today.

## 2013-11-29 NOTE — Progress Notes (Signed)
Vaginal discharge increased, not irritative but concerned not normal. GC/CT, WP sent. Doing well. Working still. Went to breastfeeding class. Iron deficiency anemia/PICA discussed. High iron foods list. Cont. FeSO4 supplement and PNV.

## 2013-11-29 NOTE — Patient Instructions (Signed)
Iron-Rich Diet An iron-rich diet contains foods that are good sources of iron. Iron is an important mineral that helps your body produce hemoglobin. Hemoglobin is a protein in red blood cells that carries oxygen to the body's tissues. Sometimes, the iron level in your blood can be low. This may be caused by:  A lack of iron in your diet.  Blood loss.  Times of growth, such as during pregnancy or during a child's growth and development. Low levels of iron can cause a decrease in the number of red blood cells. This can result in iron deficiency anemia. Iron deficiency anemia symptoms include:  Tiredness.  Weakness.  Irritability.  Increased chance of infection. Here are some recommendations for daily iron intake:  Males older than 19 years of age need 8 mg of iron per day.  Women ages 19 to 50 need 18 mg of iron per day.  Pregnant women need 27 mg of iron per day, and women who are over 19 years of age and breastfeeding need 9 mg of iron per day.  Women over the age of 50 need 8 mg of iron per day. SOURCES OF IRON There are 2 types of iron that are found in food: heme iron and nonheme iron. Heme iron is absorbed by the body better than nonheme iron. Heme iron is found in meat, poultry, and fish. Nonheme iron is found in grains, beans, and vegetables. Heme Iron Sources Food / Iron (mg)  Chicken liver, 3 oz (85 g)/ 10 mg  Beef liver, 3 oz (85 g)/ 5.5 mg  Oysters, 3 oz (85 g)/ 8 mg  Beef, 3 oz (85 g)/ 2 to 3 mg  Shrimp, 3 oz (85 g)/ 2.8 mg  Turkey, 3 oz (85 g)/ 2 mg  Chicken, 3 oz (85 g) / 1 mg  Fish (tuna, halibut), 3 oz (85 g)/ 1 mg  Pork, 3 oz (85 g)/ 0.9 mg Nonheme Iron Sources Food / Iron (mg)  Ready-to-eat breakfast cereal, iron-fortified / 3.9 to 7 mg  Tofu,  cup / 3.4 mg  Kidney beans,  cup / 2.6 mg  Baked potato with skin / 2.7 mg  Asparagus,  cup / 2.2 mg  Avocado / 2 mg  Dried peaches,  cup / 1.6 mg  Raisins,  cup / 1.5 mg  Soy milk, 1 cup  / 1.5 mg  Whole-wheat bread, 1 slice / 1.2 mg  Spinach, 1 cup / 0.8 mg  Broccoli,  cup / 0.6 mg IRON ABSORPTION Certain foods can decrease the body's absorption of iron. Try to avoid these foods and beverages while eating meals with iron-containing foods:  Coffee.  Tea.  Fiber.  Soy. Foods containing vitamin C can help increase the amount of iron your body absorbs from iron sources, especially from nonheme sources. Eat foods with vitamin C along with iron-containing foods to increase your iron absorption. Foods that are high in vitamin C include many fruits and vegetables. Some good sources are:  Fresh orange juice.  Oranges.  Strawberries.  Mangoes.  Grapefruit.  Red bell peppers.  Green bell peppers.  Broccoli.  Potatoes with skin.  Tomato juice. Document Released: 09/23/2004 Document Revised: 05/04/2011 Document Reviewed: 07/31/2010 ExitCare Patient Information 2015 [employer]. This information is not intended to replace advice given to you by your health care provider. Make sure you discuss any questions you have with your health care provider.  

## 2013-11-30 LAB — WET PREP, GENITAL
Clue Cells Wet Prep HPF POC: NONE SEEN
Trich, Wet Prep: NONE SEEN

## 2013-11-30 LAB — GC/CHLAMYDIA PROBE AMP
CT Probe RNA: NEGATIVE
GC Probe RNA: NEGATIVE

## 2013-11-30 NOTE — Progress Notes (Signed)
WP: yeast Will treat

## 2013-12-01 ENCOUNTER — Telehealth: Payer: Self-pay

## 2013-12-01 DIAGNOSIS — B3731 Acute candidiasis of vulva and vagina: Secondary | ICD-10-CM

## 2013-12-01 DIAGNOSIS — B373 Candidiasis of vulva and vagina: Secondary | ICD-10-CM

## 2013-12-01 MED ORDER — FLUCONAZOLE 150 MG PO TABS: 150.0000 mg | ORAL_TABLET | Freq: Once | ORAL | Status: DC

## 2013-12-01 NOTE — Telephone Encounter (Signed)
Attempted to contact patient. No answer. Left message stating we are calling regarding results and information regarding a RX that has been sent to your pharmacy, please call clinic.

## 2013-12-01 NOTE — Telephone Encounter (Signed)
Patient called back in to front office and I informed her of yeast infection and medication waiting for her at her [facility] pharmacy. Patient verbalized understanding and had no other questions

## 2013-12-01 NOTE — Telephone Encounter (Signed)
Message copied by Louanna RawAMPBELL, Randee Huston M on Fri Dec 01, 2013 10:52 AM ------      Message from: POE, DEIRDRE C      Created: Thu Nov 30, 2013  6:31 PM       Please RX Diflucan 150mg  po x 1 for yeast on WP ------

## 2013-12-06 ENCOUNTER — Ambulatory Visit (INDEPENDENT_AMBULATORY_CARE_PROVIDER_SITE_OTHER): Payer: Medicaid Other | Admitting: Advanced Practice Midwife

## 2013-12-06 VITALS — BP 108/65 | HR 76 | Wt 169.4 lb

## 2013-12-06 DIAGNOSIS — O99011 Anemia complicating pregnancy, first trimester: Secondary | ICD-10-CM

## 2013-12-06 LAB — POCT URINALYSIS DIP (DEVICE)
Bilirubin Urine: NEGATIVE
Glucose, UA: NEGATIVE mg/dL
Ketones, ur: NEGATIVE mg/dL
Nitrite: NEGATIVE
Protein, ur: NEGATIVE mg/dL
Specific Gravity, Urine: 1.015 (ref 1.005–1.030)
Urobilinogen, UA: 0.2 mg/dL (ref 0.0–1.0)
pH: 7.5 (ref 5.0–8.0)

## 2013-12-06 NOTE — Progress Notes (Signed)
Patient reports painful contractions that are irregular since last night.

## 2013-12-06 NOTE — Patient Instructions (Signed)
Braxton Hicks Contractions Contractions of the uterus can occur throughout pregnancy. Contractions are not always a sign that you are in labor.  WHAT ARE BRAXTON HICKS CONTRACTIONS?  Contractions that occur before labor are called Braxton Hicks contractions, or false labor. Toward the end of pregnancy (32-34 weeks), these contractions can develop more often and may become more forceful. This is not true labor because these contractions do not result in opening (dilatation) and thinning of the cervix. They are sometimes difficult to tell apart from true labor because these contractions can be forceful and people have different pain tolerances. You should not feel embarrassed if you go to the hospital with false labor. Sometimes, the only way to tell if you are in true labor is for your health care provider to look for changes in the cervix. If there are no prenatal problems or other health problems associated with the pregnancy, it is completely safe to be sent home with false labor and await the onset of true labor. HOW CAN YOU TELL THE DIFFERENCE BETWEEN TRUE AND FALSE LABOR? False Labor  The contractions of false labor are usually shorter and not as hard as those of true labor.   The contractions are usually irregular.   The contractions are often felt in the front of the lower abdomen and in the groin.   The contractions may go away when you walk around or change positions while lying down.   The contractions get weaker and are shorter lasting as time goes on.   The contractions do not usually become progressively stronger, regular, and closer together as with true labor.  True Labor  Contractions in true labor last 30-70 seconds, become very regular, usually become more intense, and increase in frequency.   The contractions do not go away with walking.   The discomfort is usually felt in the top of the uterus and spreads to the lower abdomen and low back.   True labor can be  determined by your health care provider with an exam. This will show that the cervix is dilating and getting thinner.  WHAT TO REMEMBER  Keep up with your usual exercises and follow other instructions given by your health care provider.   Take medicines as directed by your health care provider.   Keep your regular prenatal appointments.   Eat and drink lightly if you think you are going into labor.   If Braxton Hicks contractions are making you uncomfortable:   Change your position from lying down or resting to walking, or from walking to resting.   Sit and rest in a tub of warm water.   Drink 2-3 glasses of water. Dehydration may cause these contractions.   Do slow and deep breathing several times an hour.  WHEN SHOULD I SEEK IMMEDIATE MEDICAL CARE? Seek immediate medical care if:  Your contractions become stronger, more regular, and closer together.   You have fluid leaking or gushing from your vagina.   You have a fever.   You pass blood-tinged mucus.   You have vaginal bleeding.   You have continuous abdominal pain.   You have low back pain that you never had before.   You feel your baby's head pushing down and causing pelvic pressure.   Your baby is not moving as much as it used to.  Document Released: 02/09/2005 Document Revised: 02/14/2013 Document Reviewed: 11/21/2012 [employer] Patient Information 2015 [employer]. This information is not intended to replace advice given to you by your health care   provider. Make sure you discuss any questions you have with your health care provider.  Fetal Movement Counts Patient Name: __________________________________________________ Patient Due Date: ____________________ Performing a fetal movement count is highly recommended in high-risk pregnancies, but it is good for every pregnant woman to do. Your health care provider may ask you to start counting fetal movements at 28 weeks of the pregnancy. Fetal  movements often increase:  After eating a full meal.  After physical activity.  After eating or drinking something sweet or cold.  At rest. Pay attention to when you feel the baby is most active. This will help you notice a pattern of your baby's sleep and wake cycles and what factors contribute to an increase in fetal movement. It is important to perform a fetal movement count at the same time each day when your baby is normally most active.  HOW TO COUNT FETAL MOVEMENTS 1. Find a quiet and comfortable area to sit or lie down on your left side. Lying on your left side provides the best blood and oxygen circulation to your baby. 2. Write down the day and time on a sheet of paper or in a journal. 3. Start counting kicks, flutters, swishes, rolls, or jabs in a 2-hour period. You should feel at least 10 movements within 2 hours. 4. If you do not feel 10 movements in 2 hours, wait 2-3 hours and count again. Look for a change in the pattern or not enough counts in 2 hours. SEEK MEDICAL CARE IF:  You feel less than 10 counts in 2 hours, tried twice.  There is no movement in over an hour.  The pattern is changing or taking longer each day to reach 10 counts in 2 hours.  You feel the baby is not moving as he or she usually does. Date: ____________ Movements: ____________ Start time: ____________ Finish time: ____________  Date: ____________ Movements: ____________ Start time: ____________ Finish time: ____________ Date: ____________ Movements: ____________ Start time: ____________ Finish time: ____________ Date: ____________ Movements: ____________ Start time: ____________ Finish time: ____________ Date: ____________ Movements: ____________ Start time: ____________ Finish time: ____________ Date: ____________ Movements: ____________ Start time: ____________ Finish time: ____________ Date: ____________ Movements: ____________ Start time: ____________ Finish time: ____________ Date: ____________  Movements: ____________ Start time: ____________ Finish time: ____________  Date: ____________ Movements: ____________ Start time: ____________ Finish time: ____________ Date: ____________ Movements: ____________ Start time: ____________ Finish time: ____________ Date: ____________ Movements: ____________ Start time: ____________ Finish time: ____________ Date: ____________ Movements: ____________ Start time: ____________ Finish time: ____________ Date: ____________ Movements: ____________ Start time: ____________ Finish time: ____________ Date: ____________ Movements: ____________ Start time: ____________ Finish time: ____________ Date: ____________ Movements: ____________ Start time: ____________ Finish time: ____________  Date: ____________ Movements: ____________ Start time: ____________ Finish time: ____________ Date: ____________ Movements: ____________ Start time: ____________ Finish time: ____________ Date: ____________ Movements: ____________ Start time: ____________ Finish time: ____________ Date: ____________ Movements: ____________ Start time: ____________ Finish time: ____________ Date: ____________ Movements: ____________ Start time: ____________ Finish time: ____________ Date: ____________ Movements: ____________ Start time: ____________ Finish time: ____________ Date: ____________ Movements: ____________ Start time: ____________ Finish time: ____________  Date: ____________ Movements: ____________ Start time: ____________ Finish time: ____________ Date: ____________ Movements: ____________ Start time: ____________ Finish time: ____________ Date: ____________ Movements: ____________ Start time: ____________ Finish time: ____________ Date: ____________ Movements: ____________ Start time: ____________ Finish time: ____________ Date: ____________ Movements: ____________ Start time: ____________ Finish time: ____________ Date: ____________ Movements: ____________ Start time:  ____________ Finish time: ____________ Date: ____________ Movements:   ____________ Start time: ____________ Finish time: ____________  Date: ____________ Movements: ____________ Start time: ____________ Finish time: ____________ Date: ____________ Movements: ____________ Start time: ____________ Finish time: ____________ Date: ____________ Movements: ____________ Start time: ____________ Finish time: ____________ Date: ____________ Movements: ____________ Start time: ____________ Finish time: ____________ Date: ____________ Movements: ____________ Start time: ____________ Finish time: ____________ Date: ____________ Movements: ____________ Start time: ____________ Finish time: ____________ Date: ____________ Movements: ____________ Start time: ____________ Finish time: ____________  Date: ____________ Movements: ____________ Start time: ____________ Finish time: ____________ Date: ____________ Movements: ____________ Start time: ____________ Finish time: ____________ Date: ____________ Movements: ____________ Start time: ____________ Finish time: ____________ Date: ____________ Movements: ____________ Start time: ____________ Finish time: ____________ Date: ____________ Movements: ____________ Start time: ____________ Finish time: ____________ Date: ____________ Movements: ____________ Start time: ____________ Finish time: ____________ Date: ____________ Movements: ____________ Start time: ____________ Finish time: ____________  Date: ____________ Movements: ____________ Start time: ____________ Finish time: ____________ Date: ____________ Movements: ____________ Start time: ____________ Finish time: ____________ Date: ____________ Movements: ____________ Start time: ____________ Finish time: ____________ Date: ____________ Movements: ____________ Start time: ____________ Finish time: ____________ Date: ____________ Movements: ____________ Start time: ____________ Finish time: ____________ Date:  ____________ Movements: ____________ Start time: ____________ Finish time: ____________ Date: ____________ Movements: ____________ Start time: ____________ Finish time: ____________  Date: ____________ Movements: ____________ Start time: ____________ Finish time: ____________ Date: ____________ Movements: ____________ Start time: ____________ Finish time: ____________ Date: ____________ Movements: ____________ Start time: ____________ Finish time: ____________ Date: ____________ Movements: ____________ Start time: ____________ Finish time: ____________ Date: ____________ Movements: ____________ Start time: ____________ Finish time: ____________ Date: ____________ Movements: ____________ Start time: ____________ Finish time: ____________ Document Released: 03/11/2006 Document Revised: 06/26/2013 Document Reviewed: 12/07/2011 [employer] Patient Information 2015 [employer]. This information is not intended to replace advice given to you by your health care provider. Make sure you discuss any questions you have with your health care provider.  

## 2013-12-06 NOTE — Progress Notes (Signed)
Increased UC's. Discussed active labor vs BHs.

## 2013-12-13 ENCOUNTER — Ambulatory Visit (INDEPENDENT_AMBULATORY_CARE_PROVIDER_SITE_OTHER): Payer: Medicaid Other | Admitting: Advanced Practice Midwife

## 2013-12-13 ENCOUNTER — Encounter: Payer: Self-pay | Admitting: Advanced Practice Midwife

## 2013-12-13 VITALS — BP 103/67 | HR 84 | Temp 98.8°F | Wt 171.9 lb

## 2013-12-13 DIAGNOSIS — O99011 Anemia complicating pregnancy, first trimester: Secondary | ICD-10-CM

## 2013-12-13 LAB — POCT URINALYSIS DIP (DEVICE)
Bilirubin Urine: NEGATIVE
Glucose, UA: NEGATIVE mg/dL
Hgb urine dipstick: NEGATIVE
Ketones, ur: NEGATIVE mg/dL
Nitrite: NEGATIVE
Protein, ur: NEGATIVE mg/dL
Specific Gravity, Urine: 1.02 (ref 1.005–1.030)
Urobilinogen, UA: 0.2 mg/dL (ref 0.0–1.0)
pH: 7 (ref 5.0–8.0)

## 2013-12-13 NOTE — Progress Notes (Signed)
Doing well. Some increase in UC's.

## 2013-12-13 NOTE — Patient Instructions (Signed)
Breastfeeding Deciding to breastfeed is one of the best choices you can make for you and your baby. A change in hormones during pregnancy causes your breast tissue to grow and increases the number and size of your milk ducts. These hormones also allow proteins, sugars, and fats from your blood supply to make breast milk in your milk-producing glands. Hormones prevent breast milk from being released before your baby is born as well as prompt milk flow after birth. Once breastfeeding has begun, thoughts of your baby, as well as his or her sucking or crying, can stimulate the release of milk from your milk-producing glands.  BENEFITS OF BREASTFEEDING For Your Baby  Your first milk (colostrum) helps your baby's digestive system function better.   There are antibodies in your milk that help your baby fight off infections.   Your baby has a lower incidence of asthma, allergies, and sudden infant death syndrome.   The nutrients in breast milk are better for your baby than infant formulas and are designed uniquely for your baby's needs.   Breast milk improves your baby's brain development.   Your baby is less likely to develop other conditions, such as childhood obesity, asthma, or type 2 diabetes mellitus.  For You   Breastfeeding helps to create a very special bond between you and your baby.   Breastfeeding is convenient. Breast milk is always available at the correct temperature and costs nothing.   Breastfeeding helps to burn calories and helps you lose the weight gained during pregnancy.   Breastfeeding makes your uterus contract to its prepregnancy size faster and slows bleeding (lochia) after you give birth.   Breastfeeding helps to lower your risk of developing type 2 diabetes mellitus, osteoporosis, and breast or ovarian cancer later in life. SIGNS THAT YOUR BABY IS HUNGRY Early Signs of Hunger  Increased alertness or activity.  Stretching.  Movement of the head from  side to side.  Movement of the head and opening of the mouth when the corner of the mouth or cheek is stroked (rooting).  Increased sucking sounds, smacking lips, cooing, sighing, or squeaking.  Hand-to-mouth movements.  Increased sucking of fingers or hands. Late Signs of Hunger  Fussing.  Intermittent crying. Extreme Signs of Hunger Signs of extreme hunger will require calming and consoling before your baby will be able to breastfeed successfully. Do not wait for the following signs of extreme hunger to occur before you initiate breastfeeding:   Restlessness.  A loud, strong cry.   Screaming. BREASTFEEDING BASICS Breastfeeding Initiation  Find a comfortable place to sit or lie down, with your neck and back well supported.  Place a pillow or rolled up blanket under your baby to bring him or her to the level of your breast (if you are seated). Nursing pillows are specially designed to help support your arms and your baby while you breastfeed.  Make sure that your baby's abdomen is facing your abdomen.   Gently massage your breast. With your fingertips, massage from your chest wall toward your nipple in a circular motion. This encourages milk flow. You may need to continue this action during the feeding if your milk flows slowly.  Support your breast with 4 fingers underneath and your thumb above your nipple. Make sure your fingers are well away from your nipple and your baby's mouth.   Stroke your baby's lips gently with your finger or nipple.   When your baby's mouth is open wide enough, quickly bring your baby to your   breast, placing your entire nipple and as much of the colored area around your nipple (areola) as possible into your baby's mouth.   More areola should be visible above your baby's upper lip than below the lower lip.   Your baby's tongue should be between his or her lower gum and your breast.   Ensure that your baby's mouth is correctly positioned  around your nipple (latched). Your baby's lips should create a seal on your breast and be turned out (everted).  It is common for your baby to suck about 2-3 minutes in order to start the flow of breast milk. Latching Teaching your baby how to latch on to your breast properly is very important. An improper latch can cause nipple pain and decreased milk supply for you and poor weight gain in your baby. Also, if your baby is not latched onto your nipple properly, he or she may swallow some air during feeding. This can make your baby fussy. Burping your baby when you switch breasts during the feeding can help to get rid of the air. However, teaching your baby to latch on properly is still the best way to prevent fussiness from swallowing air while breastfeeding. Signs that your baby has successfully latched on to your nipple:    Silent tugging or silent sucking, without causing you pain.   Swallowing heard between every 3-4 sucks.    Muscle movement above and in front of his or her ears while sucking.  Signs that your baby has not successfully latched on to nipple:   Sucking sounds or smacking sounds from your baby while breastfeeding.  Nipple pain. If you think your baby has not latched on correctly, slip your finger into the corner of your baby's mouth to break the suction and place it between your baby's gums. Attempt breastfeeding initiation again. Signs of Successful Breastfeeding Signs from your baby:   A gradual decrease in the number of sucks or complete cessation of sucking.   Falling asleep.   Relaxation of his or her body.   Retention of a small amount of milk in his or her mouth.   Letting go of your breast by himself or herself. Signs from you:  Breasts that have increased in firmness, weight, and size 1-3 hours after feeding.   Breasts that are softer immediately after breastfeeding.  Increased milk volume, as well as a change in milk consistency and color by  the fifth day of breastfeeding.   Nipples that are not sore, cracked, or bleeding. Signs That Your Baby is Getting Enough Milk  Wetting at least 3 diapers in a 24-hour period. The urine should be clear and pale yellow by age 5 days.  At least 3 stools in a 24-hour period by age 5 days. The stool should be soft and yellow.  At least 3 stools in a 24-hour period by age 7 days. The stool should be seedy and yellow.  No loss of weight greater than 10% of birth weight during the first 3 days of age.  Average weight gain of 4-7 ounces (113-198 g) per week after age 4 days.  Consistent daily weight gain by age 5 days, without weight loss after the age of 2 weeks. After a feeding, your baby may spit up a small amount. This is common. BREASTFEEDING FREQUENCY AND DURATION Frequent feeding will help you make more milk and can prevent sore nipples and breast engorgement. Breastfeed when you feel the need to reduce the fullness of your breasts   or when your baby shows signs of hunger. This is called "breastfeeding on demand." Avoid introducing a pacifier to your baby while you are working to establish breastfeeding (the first 4-6 weeks after your baby is born). After this time you may choose to use a pacifier. Research has shown that pacifier use during the first year of a baby's life decreases the risk of sudden infant death syndrome (SIDS). Allow your baby to feed on each breast as long as he or she wants. Breastfeed until your baby is finished feeding. When your baby unlatches or falls asleep while feeding from the first breast, offer the second breast. Because newborns are often sleepy in the first few weeks of life, you may need to awaken your baby to get him or her to feed. Breastfeeding times will vary from baby to baby. However, the following rules can serve as a guide to help you ensure that your baby is properly fed:  Newborns (babies 4 weeks of age or younger) may breastfeed every 1-3  hours.  Newborns should not go longer than 3 hours during the day or 5 hours during the night without breastfeeding.  You should breastfeed your baby a minimum of 8 times in a 24-hour period until you begin to introduce solid foods to your baby at around 6 months of age. BREAST MILK PUMPING Pumping and storing breast milk allows you to ensure that your baby is exclusively fed your breast milk, even at times when you are unable to breastfeed. This is especially important if you are going back to work while you are still breastfeeding or when you are not able to be present during feedings. Your lactation consultant can give you guidelines on how long it is safe to store breast milk.  A breast pump is a machine that allows you to pump milk from your breast into a sterile bottle. The pumped breast milk can then be stored in a refrigerator or freezer. Some breast pumps are operated by hand, while others use electricity. Ask your lactation consultant which type will work best for you. Breast pumps can be purchased, but some hospitals and breastfeeding support groups lease breast pumps on a monthly basis. A lactation consultant can teach you how to hand express breast milk, if you prefer not to use a pump.  CARING FOR YOUR BREASTS WHILE YOU BREASTFEED Nipples can become dry, cracked, and sore while breastfeeding. The following recommendations can help keep your breasts moisturized and healthy:  Avoid using soap on your nipples.   Wear a supportive bra. Although not required, special nursing bras and tank tops are designed to allow access to your breasts for breastfeeding without taking off your entire bra or top. Avoid wearing underwire-style bras or extremely tight bras.  Air dry your nipples for 3-4minutes after each feeding.   Use only cotton bra pads to absorb leaked breast milk. Leaking of breast milk between feedings is normal.   Use lanolin on your nipples after breastfeeding. Lanolin helps to  maintain your skin's normal moisture barrier. If you use pure lanolin, you do not need to wash it off before feeding your baby again. Pure lanolin is not toxic to your baby. You may also hand express a few drops of breast milk and gently massage that milk into your nipples and allow the milk to air dry. In the first few weeks after giving birth, some women experience extremely full breasts (engorgement). Engorgement can make your breasts feel heavy, warm, and tender to the   touch. Engorgement peaks within 3-5 days after you give birth. The following recommendations can help ease engorgement:  Completely empty your breasts while breastfeeding or pumping. You may want to start by applying warm, moist heat (in the shower or with warm water-soaked hand towels) just before feeding or pumping. This increases circulation and helps the milk flow. If your baby does not completely empty your breasts while breastfeeding, pump any extra milk after he or she is finished.  Wear a snug bra (nursing or regular) or tank top for 1-2 days to signal your body to slightly decrease milk production.  Apply ice packs to your breasts, unless this is too uncomfortable for you.  Make sure that your baby is latched on and positioned properly while breastfeeding. If engorgement persists after 48 hours of following these recommendations, contact your health care provider or a lactation consultant. OVERALL HEALTH CARE RECOMMENDATIONS WHILE BREASTFEEDING  Eat healthy foods. Alternate between meals and snacks, eating 3 of each per day. Because what you eat affects your breast milk, some of the foods may make your baby more irritable than usual. Avoid eating these foods if you are sure that they are negatively affecting your baby.  Drink milk, fruit juice, and water to satisfy your thirst (about 10 glasses a day).   Rest often, relax, and continue to take your prenatal vitamins to prevent fatigue, stress, and anemia.  Continue  breast self-awareness checks.  Avoid chewing and smoking tobacco.  Avoid alcohol and drug use. Some medicines that may be harmful to your baby can pass through breast milk. It is important to ask your health care provider before taking any medicine, including all over-the-counter and prescription medicine as well as vitamin and herbal supplements. It is possible to become pregnant while breastfeeding. If birth control is desired, ask your health care provider about options that will be safe for your baby. SEEK MEDICAL CARE IF:   You feel like you want to stop breastfeeding or have become frustrated with breastfeeding.  You have painful breasts or nipples.  Your nipples are cracked or bleeding.  Your breasts are red, tender, or warm.  You have a swollen area on either breast.  You have a fever or chills.  You have nausea or vomiting.  You have drainage other than breast milk from your nipples.  Your breasts do not become full before feedings by the fifth day after you give birth.  You feel sad and depressed.  Your baby is too sleepy to eat well.  Your baby is having trouble sleeping.   Your baby is wetting less than 3 diapers in a 24-hour period.  Your baby has less than 3 stools in a 24-hour period.  Your baby's skin or the white part of his or her eyes becomes yellow.   Your baby is not gaining weight by 5 days of age. SEEK IMMEDIATE MEDICAL CARE IF:   Your baby is overly tired (lethargic) and does not want to wake up and feed.  Your baby develops an unexplained fever. Document Released: 02/09/2005 Document Revised: 02/14/2013 Document Reviewed: 08/03/2012 Exit[employer] Patient Information 2015 [employer]. This information is not intended to replace advice given to you by your health care provider. Make sure you discuss any questions you have with your health care provider.  

## 2013-12-20 ENCOUNTER — Encounter: Payer: Self-pay | Admitting: Obstetrics and Gynecology

## 2013-12-20 ENCOUNTER — Ambulatory Visit (INDEPENDENT_AMBULATORY_CARE_PROVIDER_SITE_OTHER): Payer: Medicaid Other | Admitting: Obstetrics and Gynecology

## 2013-12-20 VITALS — BP 106/65 | HR 96 | Temp 98.6°F | Wt 171.5 lb

## 2013-12-20 DIAGNOSIS — Z3403 Encounter for supervision of normal first pregnancy, third trimester: Secondary | ICD-10-CM

## 2013-12-20 LAB — POCT URINALYSIS DIP (DEVICE)
Bilirubin Urine: NEGATIVE
Glucose, UA: NEGATIVE mg/dL
Hgb urine dipstick: NEGATIVE
Ketones, ur: NEGATIVE mg/dL
Nitrite: NEGATIVE
Protein, ur: NEGATIVE mg/dL
Specific Gravity, Urine: 1.02 (ref 1.005–1.030)
Urobilinogen, UA: 0.2 mg/dL (ref 0.0–1.0)
pH: 7 (ref 5.0–8.0)

## 2013-12-20 NOTE — Progress Notes (Signed)
Doing well. S/sx labor and plans reviewed.

## 2013-12-20 NOTE — Patient Instructions (Signed)
Etonogestrel implant What is this medicine? ETONOGESTREL (et oh noe JES trel) is a contraceptive (birth control) device. It is used to prevent pregnancy. It can be used for up to 3 years. This medicine may be used for other purposes; ask your health care provider or pharmacist if you have questions. COMMON BRAND NAME(S): Implanon, Nexplanon What should I tell my health care provider before I take this medicine? They need to know if you have any of these conditions: -abnormal vaginal bleeding -blood vessel disease or blood clots -cancer of the breast, cervix, or liver -depression -diabetes -gallbladder disease -headaches -heart disease or recent heart attack -high blood pressure -high cholesterol -kidney disease -liver disease -renal disease -seizures -tobacco smoker -an unusual or allergic reaction to etonogestrel, other hormones, anesthetics or antiseptics, medicines, foods, dyes, or preservatives -pregnant or trying to get pregnant -breast-feeding How should I use this medicine? This device is inserted just under the skin on the inner side of your upper arm by a health care professional. Talk to your pediatrician regarding the use of this medicine in children. Special care may be needed. Overdosage: If you think you've taken too much of this medicine contact a poison control center or emergency room at once. Overdosage: If you think you have taken too much of this medicine contact a poison control center or emergency room at once. NOTE: This medicine is only for you. Do not share this medicine with others. What if I miss a dose? This does not apply. What may interact with this medicine? Do not take this medicine with any of the following medications: -amprenavir -bosentan -fosamprenavir This medicine may also interact with the following medications: -barbiturate medicines for inducing sleep or treating seizures -certain medicines for fungal infections like ketoconazole and  itraconazole -griseofulvin -medicines to treat seizures like carbamazepine, felbamate, oxcarbazepine, phenytoin, topiramate -modafinil -phenylbutazone -rifampin -some medicines to treat HIV infection like atazanavir, indinavir, lopinavir, nelfinavir, tipranavir, ritonavir -St. John's wort This list may not describe all possible interactions. Give your health care provider a list of all the medicines, herbs, non-prescription drugs, or dietary supplements you use. Also tell them if you smoke, drink alcohol, or use illegal drugs. Some items may interact with your medicine. What should I watch for while using this medicine? This product does not protect you against HIV infection (AIDS) or other sexually transmitted diseases. You should be able to feel the implant by pressing your fingertips over the skin where it was inserted. Tell your doctor if you cannot feel the implant. What side effects may I notice from receiving this medicine? Side effects that you should report to your doctor or health care professional as soon as possible: -allergic reactions like skin rash, itching or hives, swelling of the face, lips, or tongue -breast lumps -changes in vision -confusion, trouble speaking or understanding -dark urine -depressed mood -general ill feeling or flu-like symptoms -light-colored stools -loss of appetite, nausea -right upper belly pain -severe headaches -severe pain, swelling, or tenderness in the abdomen -shortness of breath, chest pain, swelling in a leg -signs of pregnancy -sudden numbness or weakness of the face, arm or leg -trouble walking, dizziness, loss of balance or coordination -unusual vaginal bleeding, discharge -unusually weak or tired -yellowing of the eyes or skin Side effects that usually do not require medical attention (Report these to your doctor or health care professional if they continue or are bothersome.): -acne -breast pain -changes in  weight -cough -fever or chills -headache -irregular menstrual bleeding -itching, burning, and   vaginal discharge -pain or difficulty passing urine -sore throat This list may not describe all possible side effects. Call your doctor for medical advice about side effects. You may report side effects to FDA at 1-800-FDA-1088. Where should I keep my medicine? This drug is given in a hospital or clinic and will not be stored at home. NOTE: This sheet is a summary. It may not cover all possible information. If you have questions about this medicine, talk to your doctor, pharmacist, or health care provider.  2015, [employer]. (2011-08-17 15:37:45)  

## 2013-12-22 ENCOUNTER — Inpatient Hospital Stay (HOSPITAL_COMMUNITY)
Admission: AD | Admit: 2013-12-22 | Discharge: 2013-12-25 | DRG: 775 | Disposition: A | Discharge status: Home / Self Care | Payer: Medicaid Other | Source: Ambulatory Visit | Attending: Family Medicine | Admitting: Family Medicine

## 2013-12-22 ENCOUNTER — Inpatient Hospital Stay (HOSPITAL_COMMUNITY): Payer: Medicaid Other | Admitting: Anesthesiology

## 2013-12-22 ENCOUNTER — Encounter: Payer: Self-pay | Admitting: Family Medicine

## 2013-12-22 ENCOUNTER — Encounter (HOSPITAL_COMMUNITY): Payer: Self-pay | Admitting: *Deleted

## 2013-12-22 ENCOUNTER — Encounter (HOSPITAL_COMMUNITY): Payer: Medicaid Other | Admitting: Anesthesiology

## 2013-12-22 DIAGNOSIS — O99824 Streptococcus B carrier state complicating childbirth: Secondary | ICD-10-CM | POA: Diagnosis present

## 2013-12-22 DIAGNOSIS — Z3A39 39 weeks gestation of pregnancy: Secondary | ICD-10-CM | POA: Diagnosis present

## 2013-12-22 DIAGNOSIS — O4292 Full-term premature rupture of membranes, unspecified as to length of time between rupture and onset of labor: Principal | ICD-10-CM | POA: Diagnosis present

## 2013-12-22 DIAGNOSIS — A599 Trichomoniasis, unspecified: Secondary | ICD-10-CM

## 2013-12-22 DIAGNOSIS — O9989 Other specified diseases and conditions complicating pregnancy, childbirth and the puerperium: Secondary | ICD-10-CM | POA: Diagnosis present

## 2013-12-22 HISTORY — DX: Adverse effect of unspecified anesthetic, initial encounter: T41.45XA

## 2013-12-22 HISTORY — DX: Other complications of anesthesia, initial encounter: T88.59XA

## 2013-12-22 LAB — CBC
HCT: 31.7 % — ABNORMAL LOW (ref 36.0–46.0)
Hemoglobin: 9.7 g/dL — ABNORMAL LOW (ref 12.0–15.0)
MCH: 22.5 pg — ABNORMAL LOW (ref 26.0–34.0)
MCHC: 30.6 g/dL (ref 30.0–36.0)
MCV: 73.5 fL — ABNORMAL LOW (ref 78.0–100.0)
Platelets: 243 10*3/uL (ref 150–400)
RBC: 4.31 MIL/uL (ref 3.87–5.11)
RDW: 15.5 % (ref 11.5–15.5)
WBC: 8.7 10*3/uL (ref 4.0–10.5)

## 2013-12-22 LAB — HIV ANTIBODY (ROUTINE TESTING W REFLEX): HIV 1&2 Ab, 4th Generation: NONREACTIVE

## 2013-12-22 LAB — TYPE AND SCREEN
ABO/RH(D): O POS
Antibody Screen: NEGATIVE

## 2013-12-22 LAB — ABO/RH: ABO/RH(D): O POS

## 2013-12-22 MED ORDER — LACTATED RINGERS IV SOLN
500.0000 mL | Freq: Once | INTRAVENOUS | Status: AC
  Administered 2013-12-22: 500 mL via INTRAVENOUS

## 2013-12-22 MED ORDER — LIDOCAINE HCL (PF) 1 % IJ SOLN
INTRAMUSCULAR | Status: DC | PRN
  Administered 2013-12-22: 8 mL
  Administered 2013-12-22: 3 mL

## 2013-12-22 MED ORDER — PHENYLEPHRINE 40 MCG/ML (10ML) SYRINGE FOR IV PUSH (FOR BLOOD PRESSURE SUPPORT)
80.0000 ug | PREFILLED_SYRINGE | INTRAVENOUS | Status: DC | PRN
  Filled 2013-12-22: qty 10

## 2013-12-22 MED ORDER — FENTANYL CITRATE 0.05 MG/ML IJ SOLN
100.0000 ug | Freq: Once | INTRAMUSCULAR | Status: AC
  Administered 2013-12-22: 100 ug via INTRAVENOUS
  Filled 2013-12-22: qty 2

## 2013-12-22 MED ORDER — DIPHENHYDRAMINE HCL 50 MG/ML IJ SOLN: 12.5000 mg | INTRAMUSCULAR | Status: DC | PRN

## 2013-12-22 MED ORDER — PHENYLEPHRINE 40 MCG/ML (10ML) SYRINGE FOR IV PUSH (FOR BLOOD PRESSURE SUPPORT): 80.0000 ug | PREFILLED_SYRINGE | INTRAVENOUS | Status: DC | PRN

## 2013-12-22 MED ORDER — OXYCODONE-ACETAMINOPHEN 5-325 MG PO TABS: 2.0000 | ORAL_TABLET | ORAL | Status: DC | PRN

## 2013-12-22 MED ORDER — LACTATED RINGERS IV SOLN: 500.0000 mL | INTRAVENOUS | Status: DC | PRN

## 2013-12-22 MED ORDER — PENICILLIN G POTASSIUM 5000000 UNITS IJ SOLR
2.5000 10*6.[IU] | INTRAVENOUS | Status: DC
  Administered 2013-12-22 – 2013-12-23 (×2): 2.5 10*6.[IU] via INTRAVENOUS
  Filled 2013-12-22 (×6): qty 2.5

## 2013-12-22 MED ORDER — CITRIC ACID-SODIUM CITRATE 334-500 MG/5ML PO SOLN: 30.0000 mL | ORAL | Status: DC | PRN

## 2013-12-22 MED ORDER — EPHEDRINE 5 MG/ML INJ: 10.0000 mg | INTRAVENOUS | Status: DC | PRN

## 2013-12-22 MED ORDER — OXYCODONE-ACETAMINOPHEN 5-325 MG PO TABS: 1.0000 | ORAL_TABLET | ORAL | Status: DC | PRN

## 2013-12-22 MED ORDER — OXYTOCIN BOLUS FROM INFUSION: 500.0000 mL | INTRAVENOUS | Status: DC

## 2013-12-22 MED ORDER — LIDOCAINE HCL (PF) 1 % IJ SOLN: 30.0000 mL | INTRAMUSCULAR | Status: DC | PRN

## 2013-12-22 MED ORDER — OXYTOCIN 40 UNITS IN LACTATED RINGERS INFUSION - SIMPLE MED
62.5000 mL/h | INTRAVENOUS | Status: DC
  Filled 2013-12-22: qty 1000

## 2013-12-22 MED ORDER — FENTANYL 2.5 MCG/ML BUPIVACAINE 1/10 % EPIDURAL INFUSION (WH - ANES)
INTRAMUSCULAR | Status: DC | PRN
  Administered 2013-12-22: 14 mL/h via EPIDURAL

## 2013-12-22 MED ORDER — FENTANYL 2.5 MCG/ML BUPIVACAINE 1/10 % EPIDURAL INFUSION (WH - ANES)
14.0000 mL/h | INTRAMUSCULAR | Status: DC | PRN
  Administered 2013-12-22: 14 mL/h via EPIDURAL
  Filled 2013-12-22: qty 125

## 2013-12-22 MED ORDER — ONDANSETRON HCL 4 MG/2ML IJ SOLN: 4.0000 mg | Freq: Four times a day (QID) | INTRAMUSCULAR | Status: DC | PRN

## 2013-12-22 MED ORDER — ACETAMINOPHEN 325 MG PO TABS: 650.0000 mg | ORAL_TABLET | ORAL | Status: DC | PRN

## 2013-12-22 MED ORDER — LACTATED RINGERS IV SOLN
INTRAVENOUS | Status: DC
  Administered 2013-12-22 (×2): via INTRAVENOUS

## 2013-12-22 MED ORDER — PENICILLIN G POTASSIUM 5000000 UNITS IJ SOLR
5.0000 10*6.[IU] | Freq: Once | INTRAVENOUS | Status: AC
  Administered 2013-12-22: 5 10*6.[IU] via INTRAVENOUS
  Filled 2013-12-22: qty 5

## 2013-12-22 NOTE — Telephone Encounter (Signed)
Called patient and advised her to go to [facility]U due to water leaking from vagina. Patient agrees and will come to [facility]U.

## 2013-12-22 NOTE — H&P (Signed)
Attestation of Attending Supervision of Advanced Practitioner (PA/CNM/NP): Evaluation and management procedures were performed by the Advanced Practitioner under my supervision and collaboration.  I have reviewed the Advanced Practitioner's note and chart, and I agree with the management and plan.  Quincey Quesinberry S, MD [facility] Attending 12/22/2013 4:05 PM   

## 2013-12-22 NOTE — MAU Provider Note (Signed)
Attestation of Attending Supervision of Advanced Practitioner (PA/CNM/NP): Evaluation and management procedures were performed by the Advanced Practitioner under my supervision and collaboration.  I have reviewed the Advanced Practitioner's note and chart, and I agree with the management and plan.  Reva BoresPRATT,TANYA S, MD [facility] Attending 12/22/2013 4:05 PM

## 2013-12-22 NOTE — MAU Provider Note (Signed)
Chief Complaint:  Labor Eval   First Provider Initiated Contact with Patient 12/22/13 1425      HPI: [name] is a 18 y.o. G1P0 at 472w2d who presents reporting leakage of clear fluid enough to wear a pad starting at 1000 today.  Denies contractions but has nonpainful cramps. Denies vaginal bleeding. Good fetal movement.   Pregnancy Course: LRC; GBS ASB, trich  Past Medical History: Past Medical History  Diagnosis Date  . Low iron   . Anemia     Past obstetric history: OB History  Gravida Para Term Preterm AB SAB TAB Ectopic Multiple Living  1             # Outcome Date GA Lbr Len/2nd Weight Sex Delivery Anes PTL Lv  1 CUR               Past Surgical History: Past Surgical History  Procedure Laterality Date  . No past surgeries       Family History: Family History  Problem Relation Age of Onset  . Alcohol abuse Neg Hx   . Arthritis Neg Hx   . Birth defects Neg Hx   . Cancer Neg Hx   . COPD Neg Hx   . Depression Neg Hx   . Diabetes Neg Hx   . Drug abuse Neg Hx   . Early death Neg Hx   . Hearing loss Neg Hx   . Heart disease Neg Hx   . Hyperlipidemia Neg Hx   . Hypertension Neg Hx   . Kidney disease Neg Hx   . Learning disabilities Neg Hx   . Mental illness Neg Hx   . Mental retardation Neg Hx   . Miscarriages / Stillbirths Neg Hx   . Stroke Neg Hx   . Vision loss Neg Hx   . Varicose Veins Neg Hx   . Asthma Mother   . Asthma Sister   . Asthma Brother     Social History: History  Substance Use Topics  . Smoking status: Never Smoker   . Smokeless tobacco: Never Used  . Alcohol Use: No    Allergies: No Known Allergies  Meds:  Facility-administered medications prior to admission  Medication Dose Route Frequency Provider Last Rate Last Dose  . Tdap (BOOSTRIX) injection 0.5 mL  0.5 mL Intramuscular Once Walidah N Karim, CNM       Prescriptions prior to admission  Medication Sig Dispense Refill  . acetaminophen (TYLENOL) 325 MG tablet Take 650  mg by mouth every 6 (six) hours as needed for mild pain or headache.       Marland Kitchen. acetaminophen (TYLENOL) 325 MG tablet Take 2 tablets (650 mg total) by mouth every 6 (six) hours as needed.  60 tablet  0  . ferrous sulfate 325 (65 FE) MG tablet Take 325 mg by mouth daily with breakfast.      . oxyCODONE-acetaminophen (PERCOCET/ROXICET) 5-325 MG per tablet Take 1 tablet by mouth every 4 (four) hours as needed for moderate pain or severe pain.  15 tablet  0  . Prenatal Vit-Fe Fumarate-FA (PRENATAL MULTIVITAMIN) TABS tablet Take 1 tablet by mouth daily at 12 noon.        ROS: Pertinent findings in history of present illness.  Physical Exam  Blood pressure 116/66, pulse 81, temperature 98.1 F (36.7 C), temperature source Oral, resp. rate 16, last menstrual period 03/22/2013. GENERAL: Well-developed, well-nourished female in no acute distress.  HEENT: normocephalic HEART: normal rate RESP: normal effort ABDOMEN: Soft,  non-tender, gravid appropriate for gestational age EXTREMITIES: Nontender, no edema NEURO: alert and oriented  Dilation: 2 Effacement (%): 80 Cervical Position: Posterior Station: -1 Presentation: Vertex Exam by:: Shawanda Sievert  FHT:  Baseline 140 , moderate variability, accelerations present, no decelerations Contractions: q 6-8 mins   Labs: No results found for this or any previous visit (from the past 24 hour(s)). Fern pos Imaging:  No results found. MAU Course:    Assessment: A18 yo G1 7446w2d, PROM GBS carriage  Plan: Admit  Danae Orleanseirdre C Joyel Chenette, CNM 12/22/2013 2:29 PM

## 2013-12-22 NOTE — MAU Note (Signed)
abd cramping, watery d/c.  Clear fluid, first noted around 1000.  Small amt continues every time she 'pushes'/

## 2013-12-22 NOTE — Anesthesia Procedure Notes (Signed)
Epidural Patient location during procedure: OB Start time: 12/22/2013 7:54 PM End time: 12/22/2013 7:58 PM  Staffing Anesthesiologist: Leilani AbleHATCHETT, Dorene Bruni Performed by: anesthesiologist   Preanesthetic Checklist Completed: patient identified, surgical consent, pre-op evaluation, timeout performed, IV checked, risks and benefits discussed and monitors and equipment checked  Epidural Patient position: sitting Prep: site prepped and draped and DuraPrep Patient monitoring: continuous pulse ox and blood pressure Approach: midline Location: L3-L4 Injection technique: LOR air  Needle:  Needle type: Tuohy  Needle gauge: 17 G Needle length: 9 cm and 9 Needle insertion depth: 5 cm cm Catheter type: closed end flexible Catheter size: 19 Gauge Catheter at skin depth: 10 cm Test dose: negative and Other  Assessment Sensory level: T10 Events: blood not aspirated, injection not painful, no injection resistance, negative IV test and no paresthesia  Additional Notes Reason for block:procedure for pain

## 2013-12-22 NOTE — Progress Notes (Signed)
Anita DarterKarla Robinson is a 18 y.o. G1P0000 at 7476w2d admitted for PROM  Subjective: Doing well.  Comfortable, laughing stating she is good, waiting on her Halloween baby  Objective: BP 105/50  Pulse 84  Temp(Src) 98.2 F (36.8 C) (Oral)  Resp 18  Ht 5\' 5"  (1.651 m)  Wt 173 lb (78.472 kg)  BMI 28.79 kg/m2  LMP 03/22/2013      FHT:  FHR: 145 bpm, variability: moderate,  accelerations:  Present,  decelerations:  Absent UC:   regular, every 2 minutes SVE:   Dilation: 8.5 Effacement (%): 90 Station: 0;+1 Exam by:: A.Davis, RN  Labs: Lab Results  Component Value Date   WBC 8.7 12/22/2013   HGB 9.7* 12/22/2013   HCT 31.7* 12/22/2013   MCV 73.5* 12/22/2013   PLT 243 12/22/2013    Assessment / Plan: Spontaneous labor, progressing normally  Labor: Progressing normally Preeclampsia:  no signs or symptoms of toxicity Fetal Wellbeing:  Category I Pain Control:  Epidural I/D:  n/a Anticipated MOD:  NSVD  Anita Robinson Anita Robinson 12/22/2013, 10:04 PM

## 2013-12-22 NOTE — H&P (Signed)
Anita Robinson is a 18 y.o. female presenting for SROM clear fluid at 1000 today, associated with lower  abdominal cramps.    [facility]  Maternal Medical History:  Reason for admission: Nausea.    OB History   Grav Para Term Preterm Abortions TAB SAB Ect Mult Living   1              Past Medical History  Diagnosis Date  . Low iron   . Anemia    Past Surgical History  Procedure Laterality Date  . No past surgeries     Family History: family history includes Asthma in her brother, mother, and sister. There is no history of Alcohol abuse, Arthritis, Birth defects, Cancer, COPD, Depression, Diabetes, Drug abuse, Early death, Hearing loss, Heart disease, Hyperlipidemia, Hypertension, Kidney disease, Learning disabilities, Mental illness, Mental retardation, Miscarriages / Stillbirths, Stroke, Vision loss, or Varicose Veins. Social History:  reports that she has never smoked. She has never used smokeless tobacco. She reports that she does not drink alcohol or use illicit drugs.   Prenatal Transfer Tool  Maternal Diabetes: No Genetic Screening: Declined Maternal Ultrasounds/Referrals: Normal Fetal Ultrasounds or other Referrals:  None Maternal Substance Abuse:  No Significant Maternal Medications:  None Significant Maternal Lab Results:  Lab values include: Group B Strep positive Other Comments:  None  Review of Systems  Constitutional: Negative for fever.  Cardiovascular: Negative for leg swelling.  Gastrointestinal: Positive for abdominal pain. Negative for nausea and vomiting.  Genitourinary: Negative for dysuria.  Neurological: Negative for dizziness, focal weakness and headaches.  Psychiatric/Behavioral: Negative for depression and substance abuse.    Dilation: 2 Effacement (%): 80 Station: -1 Exam by:: Terez Montee Blood pressure 116/66, pulse 81, temperature 98.1 F (36.7 C), temperature source Oral, resp. rate 16, last menstrual period 03/22/2013. Maternal  Exam:  Uterine Assessment: Contraction strength is mild.  Contraction frequency is irregular.   Abdomen: Estimated fetal weight is 7#.   Fetal presentation: vertex  Introitus: Normal vulva. Normal vagina.  Ferning test: positive.  Nitrazine test: not done. Amniotic fluid character: clear.  Pelvis: adequate for delivery.   Cervix: Cervix evaluated by digital exam.   2/80/-1 vtx  Fetal Exam Fetal Monitor Review: Mode: ultrasound.   Baseline rate: 145.  Variability: moderate (6-25 bpm).   Pattern: accelerations present and no decelerations.    Fetal State Assessment: Category I - tracings are normal.     Physical Exam  Nursing note and vitals reviewed. Constitutional: She appears well-developed and well-nourished. No distress.  HENT:  Head: Normocephalic.  Eyes: Pupils are equal, round, and reactive to light.  Neck: Normal range of motion. No thyromegaly present.  Cardiovascular: Normal rate, regular rhythm and normal heart sounds.   Respiratory: Effort normal and breath sounds normal.  GI: Soft. There is no tenderness.  Genitourinary: Vagina normal.  Musculoskeletal: Normal range of motion. She exhibits no edema.  Neurological: She displays normal reflexes.  Skin: Skin is warm and dry.  Psychiatric: She has a normal mood and affect. Her behavior is normal. Judgment and thought content normal.    Prenatal labs: ABO, Rh: O/POS/-- (03/31 0920) Antibody: NEG (03/31 0920) Rubella: 6.46 (03/31 0920) RPR: NON REAC (07/30 0945)  HBsAg: NEGATIVE (03/31 0920)  HIV: NONREACTIVE (07/30 0945)  GBS: Positive (05/06 0000)  1 hr GCT: 85  Assessment/Plan: 18 yo G1 at 4732w2d PROM x 4 hrs Cx somewhat favorable Admit FB    Sorcha Rotunno 12/22/2013, 2:39 PM

## 2013-12-22 NOTE — Anesthesia Preprocedure Evaluation (Signed)
Anesthesia Evaluation  Patient identified by MRN, date of birth, ID band Patient awake    Reviewed: Allergy & Precautions, H&P , NPO status , Patient's Chart, lab work & pertinent test results  Airway Mallampati: II TM Distance: >3 FB Neck ROM: full    Dental no notable dental hx.    Pulmonary neg pulmonary ROS,    Pulmonary exam normal       Cardiovascular negative cardio ROS      Neuro/Psych negative neurological ROS  negative psych ROS   GI/Hepatic negative GI ROS, Neg liver ROS,   Endo/Other  negative endocrine ROS  Renal/GU negative Renal ROS     Musculoskeletal   Abdominal Normal abdominal exam  (+)   Peds  Hematology   Anesthesia Other Findings   Reproductive/Obstetrics (+) Pregnancy                           Anesthesia Physical Anesthesia Plan  ASA: II  Anesthesia Plan: Epidural   Post-op Pain Management:    Induction:   Airway Management Planned:   Additional Equipment:   Intra-op Plan:   Post-operative Plan:   Informed Consent: I have reviewed the patients History and Physical, chart, labs and discussed the procedure including the risks, benefits and alternatives for the proposed anesthesia with the patient or authorized representative who has indicated his/her understanding and acceptance.     Plan Discussed with:   Anesthesia Plan Comments:         Anesthesia Quick Evaluation  

## 2013-12-22 NOTE — Progress Notes (Signed)
Patient ID: Anita Robinson, female   DOB: [birth year], 18 y.o.   MRN: [id number] Anita Robinson is a 18 y.o. G1P0000 at 5156w2d admitted for PROM  Subjective: UCs more uncomfortable and started to have bloody show.   Objective: BP 105/70  Pulse 77  Temp(Src) 98.5 F (36.9 C) (Oral)  Resp 18  Ht 5\' 5"  (1.651 m)  Wt 78.472 kg (173 lb)  BMI 28.79 kg/m2  LMP 03/22/2013  Fetal Heart FHR: 140 bpm, variability: moderate,  accelerations:  Present,  decelerations:  Absent   Contractions: irreglar 2 1/2 - 5min  SVE:   Dilation: 4 Effacement (%): 90 Station: -1 Exam by:: Caren Griffinseirdre Poe, CNM   Assessment / Plan:  Labor: Early active Fetal Wellbeing: Category 1 Pain Control:  Requests analgesia> may have IV med or epidural Expected mode of delivery: NSVD  POE,DEIRDRE 12/22/2013, 6:07 PM

## 2013-12-23 ENCOUNTER — Encounter (HOSPITAL_COMMUNITY): Payer: Self-pay | Admitting: *Deleted

## 2013-12-23 DIAGNOSIS — Z3A39 39 weeks gestation of pregnancy: Secondary | ICD-10-CM

## 2013-12-23 DIAGNOSIS — O99824 Streptococcus B carrier state complicating childbirth: Secondary | ICD-10-CM

## 2013-12-23 LAB — RPR

## 2013-12-23 MED ORDER — DIBUCAINE 1 % RE OINT
1.0000 "application " | TOPICAL_OINTMENT | RECTAL | Status: DC | PRN
  Filled 2013-12-23: qty 28

## 2013-12-23 MED ORDER — ZOLPIDEM TARTRATE 5 MG PO TABS: 5.0000 mg | ORAL_TABLET | Freq: Every evening | ORAL | Status: DC | PRN

## 2013-12-23 MED ORDER — LANOLIN HYDROUS EX OINT: TOPICAL_OINTMENT | CUTANEOUS | Status: DC | PRN

## 2013-12-23 MED ORDER — TETANUS-DIPHTH-ACELL PERTUSSIS 5-2.5-18.5 LF-MCG/0.5 IM SUSP: 0.5000 mL | Freq: Once | INTRAMUSCULAR | Status: DC

## 2013-12-23 MED ORDER — BENZOCAINE-MENTHOL 20-0.5 % EX AERO
1.0000 "application " | INHALATION_SPRAY | CUTANEOUS | Status: DC | PRN
  Administered 2013-12-25: 1 via TOPICAL
  Filled 2013-12-23 (×2): qty 56

## 2013-12-23 MED ORDER — WITCH HAZEL-GLYCERIN EX PADS: 1.0000 "application " | MEDICATED_PAD | CUTANEOUS | Status: DC | PRN

## 2013-12-23 MED ORDER — ONDANSETRON HCL 4 MG PO TABS: 4.0000 mg | ORAL_TABLET | ORAL | Status: DC | PRN

## 2013-12-23 MED ORDER — SIMETHICONE 80 MG PO CHEW: 80.0000 mg | CHEWABLE_TABLET | ORAL | Status: DC | PRN

## 2013-12-23 MED ORDER — ONDANSETRON HCL 4 MG/2ML IJ SOLN: 4.0000 mg | INTRAMUSCULAR | Status: DC | PRN

## 2013-12-23 MED ORDER — IBUPROFEN 600 MG PO TABS
600.0000 mg | ORAL_TABLET | Freq: Four times a day (QID) | ORAL | Status: DC
  Administered 2013-12-23 – 2013-12-25 (×10): 600 mg via ORAL
  Filled 2013-12-23 (×10): qty 1

## 2013-12-23 MED ORDER — SENNOSIDES-DOCUSATE SODIUM 8.6-50 MG PO TABS
2.0000 | ORAL_TABLET | ORAL | Status: DC
  Administered 2013-12-24 (×2): 2 via ORAL
  Filled 2013-12-23 (×2): qty 2

## 2013-12-23 MED ORDER — OXYCODONE-ACETAMINOPHEN 5-325 MG PO TABS
1.0000 | ORAL_TABLET | ORAL | Status: DC | PRN
  Administered 2013-12-23 – 2013-12-24 (×5): 1 via ORAL
  Filled 2013-12-23 (×5): qty 1

## 2013-12-23 MED ORDER — OXYCODONE-ACETAMINOPHEN 5-325 MG PO TABS: 2.0000 | ORAL_TABLET | ORAL | Status: DC | PRN

## 2013-12-23 MED ORDER — DIPHENHYDRAMINE HCL 25 MG PO CAPS: 25.0000 mg | ORAL_CAPSULE | Freq: Four times a day (QID) | ORAL | Status: DC | PRN

## 2013-12-23 MED ORDER — PRENATAL MULTIVITAMIN CH
1.0000 | ORAL_TABLET | Freq: Every day | ORAL | Status: DC
  Administered 2013-12-23 – 2013-12-25 (×3): 1 via ORAL
  Filled 2013-12-23 (×3): qty 1

## 2013-12-23 NOTE — Progress Notes (Signed)
I have seen and examined this patient and I agree with the above. Jiali Linney CNM 2:34 AM 12/23/2013    

## 2013-12-23 NOTE — Lactation Note (Signed)
This note was copied from the chart of Anita Robinson. Lactation Consultation Note New mom took BF classes. Had several visitors in rm. Mom going to breast and formula bottle feed. Has WIC, will purchase breast pump after d/c home. Mom encouraged to feed baby 8-12 times/24 hours and with feeding cues. Mom reports + breast changes w/pregnancy. Mom encouraged to waken baby for feeds. Mom encouraged to do skin-to-skin. Encouraged to call for assistance if needed and to verify proper latch.WH/LC brochure given w/resources, support groups and LC services. Referred to Baby and Me Book in Breastfeeding section Pg. 22-23 for position options and Proper latch demonstration. Educated about newborn behavior. Discussed hand expression, d/t visitors unable to demonstrate at this time. Discussed positioning and proper latches.  Patient Name: Anita Robinson ZOXWR'UToday's Date: 12/23/2013 Reason for consult: Initial assessment   Maternal Data Has patient been taught Hand Expression?: Yes Does the patient have breastfeeding experience prior to this delivery?: No  Feeding Feeding Type: Breast Fed Length of feed: 30 min  LATCH Score/Interventions                      Lactation Tools Discussed/Used WIC Program: Yes   Consult Status Consult Status: Follow-up Date: 12/24/13 Follow-up type: In-patient    Charyl DancerCARVER, Sarinah Doetsch G 12/23/2013, 1:37 PM

## 2013-12-23 NOTE — Anesthesia Postprocedure Evaluation (Signed)
Anesthesia Post Note  Patient: Anita DarterKarla Robinson  Procedure(s) Performed: * No procedures listed *  Anesthesia type: Epidural  Patient location: Mother/Baby  Post pain: Pain level controlled  Post assessment: Post-op Vital signs reviewed  Last Vitals:  Filed Vitals:   12/23/13 0440  BP: 92/62  Pulse: 88  Temp: 37.5 C  Resp: 18    Post vital signs: Reviewed  Level of consciousness:alert  Complications: No apparent anesthesia complications

## 2013-12-24 NOTE — Progress Notes (Addendum)
Post Partum Day 1 Subjective:  Anita Robinson is a 18 y.o. G1P1001 615w3d s/p SVD.  No acute events overnight.  Pt denies problems with ambulating, voiding or po intake.  She denies nausea or vomiting.  Pain is well controlled. Plan for birth control is nexplanon.  Method of Feeding: bottle  Objective: Blood pressure 91/51, pulse 64, temperature 98.3 F (36.8 C), temperature source Oral, resp. rate 18, height 5\' 5"  (1.651 m), weight 173 lb (78.472 kg), last menstrual period 03/22/2013, unknown if currently breastfeeding.  Physical Exam:  General: alert, cooperative and no distress Lochia:normal flow Chest: CTAB Heart: RRR no m/r/g Abdomen: +BS, soft, nontender,  Uterine Fundus: firm DVT Evaluation: No evidence of DVT seen on physical exam. Extremities: no edema   Recent Labs  12/22/13 1539  HGB 9.7*  HCT 31.7*    Assessment/Plan:  ASSESSMENT: Anita Robinson is a 18 y.o. G1P1001 715w3d s/p SVD - discharge tomorrow 2/2 GBS+  Plan for discharge tomorrow   LOS: 2 days   Anita Robinson 12/24/2013, 8:33 AM

## 2013-12-25 ENCOUNTER — Encounter (HOSPITAL_COMMUNITY): Payer: Self-pay | Admitting: *Deleted

## 2013-12-25 MED ORDER — IBUPROFEN 600 MG PO TABS
600.0000 mg | ORAL_TABLET | Freq: Four times a day (QID) | ORAL | Status: DC | PRN
Start: 2013-12-25 — End: 2015-11-21

## 2013-12-25 NOTE — Discharge Summary (Signed)
Obstetric Discharge Summary Reason for 3Admission: onset of labor Prenatal Procedures: none Intrapartum Procedures: spontaneous vaginal delivery Postpartum Procedures: none Complications-Operative and Postpartum: none  Hospital Course:  Patient was admitted with PROM. She progressed with labor augmentation with no complications.   Delivery Note At 1:16 AM a viable, healthy female was delivered via Vaginal over an intact perineum. Spontaneous Delivery (Presentation: Right Occiput Posterior). APGAR: 8, 9; weight: pending at time of note.  Placenta status: Intact, Spontaneous. Cord: 3 vessels with the following complications: Nuchal times 2.   Anesthesia: Epidural  Episiotomy: none Lacerations: none (sm abrasion at base of introitus, well approx, not bldg) Suture Repair: N/A Est. Blood Loss (mL): 200  Family at bedside for support. Mother bonding with baby well, skin to skin immediately after birth. Mom to postpartum. Baby to Couplet care / Skin to Skin.  ADAMS,SHNIQUAL SHWON 12/23/2013, 1:37 AM  I have seen and examined this patient and I agree with the above. SHAW, KIMBERLY CNM 2:25 AM 12/23/2013 AM  Postpartum, the patient did well with no complications.  On the day of discharge,  Pt denied problems with ambulating, voiding or po intake.  She denied nausea or vomiting.  Pain wass well controlled.  She has had flatus. She has not had bowel movement.  Lochia Small.  Plan for birth control is nexplanon.    H/H: Lab Results  Component Value Date/Time   HGB 9.7* 12/22/2013 03:39 PM   HCT 31.7* 12/22/2013 03:39 PM    Filed Vitals:   12/25/13 0550  BP: 94/57  Pulse: 67  Temp: 97.7 F (36.5 C)  Resp: 18    Physical Exam: VSS NAD Abd: Appropriately tender, ND, Fundus firm No c/c/e, Neg homan's sign, neg cords Lochia Appropriate  Discharge Diagnoses: Term Pregnancy-delivered  Discharge Information: Date: 12/25/2013 Activity: pelvic rest Diet: routine   Medications: PNV, Tylenol #3 and Iron Breast feeding:  Yes Condition: stable Instructions: refer to handout Discharge to: home      Medication List    TAKE these medications        acetaminophen 325 MG tablet  Commonly known as:  TYLENOL  Take 650 mg by mouth every 6 (six) hours as needed for mild pain or headache.     ferrous sulfate 325 (65 FE) MG tablet  Take 325 mg by mouth daily with breakfast.     ibuprofen 600 MG tablet  Commonly known as:  ADVIL,MOTRIN  Take 1 tablet (600 mg total) by mouth every 6 (six) hours as needed.     oxyCODONE-acetaminophen 5-325 MG per tablet  Commonly known as:  PERCOCET/ROXICET  Take 1 tablet by mouth every 4 (four) hours as needed for moderate pain or severe pain.     prenatal multivitamin Tabs tablet  Take 1 tablet by mouth daily at 12 noon.         Jacquiline DoeParker, Caleb 12/25/2013,9:35 AM  Evaluation and management procedures were performed by Resident physician under my supervision/collaboration. Danae Orleanseirdre C Monzerat Handler, CNM 12/25/2013 9:50 AM  Chart reviewed, patient examined by me and I agree with management and plan.

## 2013-12-25 NOTE — Lactation Note (Signed)
This note was copied from the chart of Anita Robinson. Lactation Consultation Note  Mom plans to breast and bottle feed formula.  I explained supply and demand to her and reviewed hand expression.  Colostrum is easily expressible.  A hand pump was given to her with explanation and she was able to return demonstration.  Much encouragement given.  Mom was excited to be using the pump.  I explained to her that she may be able to supply enough breast milk for her baby if she adds pumping to breastfeeding.  Understanding verbalized.  Aware of outpatient services.  Patient Name: Anita Robinson ZOXWR'UToday's Date: 12/25/2013     Maternal Data    Feeding Feeding Type: Formula  LATCH Score/Interventions                      Lactation Tools Discussed/Used     Consult Status      Anita Robinson, Anita Robinson 12/25/2013, 10:43 AM

## 2013-12-25 NOTE — Lactation Note (Signed)
This note was copied from the chart of Anita Robinson. Lactation Consultation Note Mom using DEBP. Mom has edema to breast, nipples compress flat and baby is unable to latch. Areola feels thick, reverse pressure attempted/ ice applied. Breast feels filling. Hand expression taught, just able to get a drop of colostrum to end of nipple, Using DEBP just drop of colostrum to end of nipple. Fitted w/#20-24 NS. Mom didn't want to allow me to help her latch baby. She stated the baby is resting and I'm tired as well. Encouraged breast massage and hand expression to relieve and soften breast. Mom giving bottles w/formula. Has a flat affect. Baby has had 7 voids and 10 poops. Mom hopes to be d/c home today. [facility] d/t needs to assist w/nipple shield and will need to f/u after d/c home. Patient Name: Anita Robinson YNWGN'FToday's Date: 12/25/2013 Reason for consult: Follow-up assessment;Difficult latch   Maternal Data    Feeding    LATCH Score/Interventions       Type of Nipple: Flat Intervention(s): Shells;Double electric pump  Comfort (Breast/Nipple): Filling, red/small blisters or bruises, mild/mod discomfort  Problem noted: Mild/Moderate discomfort Interventions  (Cracked/bleeding/bruising/blister): Double electric pump;Expressed breast milk to nipple Interventions (Mild/moderate discomfort): Hand massage;Hand expression;Post-pump;Comfort gels;Breast shields  Hold (Positioning): Assistance needed to correctly position infant at breast and maintain latch. Intervention(s): Breastfeeding basics reviewed;Support Pillows;Position options;Skin to skin     Lactation Tools Discussed/Used Tools: Pump;Comfort gels;Nipple Shields Nipple shield size: 20;24 Breast pump type: Double-Electric Breast Pump Pump Review: Setup, frequency, and cleaning;Milk Storage Initiated by:: RN Date initiated:: 12/24/13   Consult Status Consult Status: Follow-up Date:  12/25/13 Follow-up type: In-patient    Anita Robinson, Anita Robinson 12/25/2013, 7:15 AM

## 2013-12-25 NOTE — Discharge Instructions (Signed)

## 2013-12-25 NOTE — Progress Notes (Signed)
Ur chart review completed.  

## 2013-12-27 ENCOUNTER — Encounter: Payer: Medicaid Other | Admitting: Advanced Practice Midwife

## 2014-01-24 ENCOUNTER — Encounter: Payer: Self-pay | Admitting: *Deleted

## 2014-01-24 ENCOUNTER — Telehealth: Payer: Self-pay | Admitting: *Deleted

## 2014-01-24 NOTE — Telephone Encounter (Signed)
Anita ComptonKarla called and left a message she wants to get a work release so she can go back to work . Per chart had vaginal delivery 12/23/13 .

## 2014-01-24 NOTE — Telephone Encounter (Signed)
Discussed patient request with Dr. Adrian BlackwaterStinson- states she may return to work if she feels ready, no problems, but keep postpartum appointment. Called Paula ComptonKarla and she states she feels ready to return on 02/04/14 and denies any issues. She will come by clinic to sign release so letter can be faxed to her employer. She states she if planning to come to her postpartum appointment.

## 2014-02-05 ENCOUNTER — Encounter: Payer: Self-pay | Admitting: Obstetrics & Gynecology

## 2014-02-05 ENCOUNTER — Ambulatory Visit (INDEPENDENT_AMBULATORY_CARE_PROVIDER_SITE_OTHER): Payer: Medicaid Other | Admitting: Obstetrics & Gynecology

## 2014-02-05 VITALS — BP 99/55 | HR 76 | Temp 98.6°F | Wt 155.2 lb

## 2014-02-05 DIAGNOSIS — Z308 Encounter for other contraceptive management: Secondary | ICD-10-CM

## 2014-02-05 DIAGNOSIS — Z30017 Encounter for initial prescription of implantable subdermal contraceptive: Secondary | ICD-10-CM

## 2014-02-05 LAB — POCT PREGNANCY, URINE: Preg Test, Ur: NEGATIVE

## 2014-02-05 MED ORDER — ETONOGESTREL 68 MG ~~LOC~~ IMPL
68.0000 mg | DRUG_IMPLANT | Freq: Once | SUBCUTANEOUS | Status: AC
  Administered 2014-02-05: 68 mg via SUBCUTANEOUS

## 2014-02-05 NOTE — Progress Notes (Signed)
Subjective:requests nexplanon. Abstained since delivery     Anita DarterKarla Robinson is a 18 y.o. female who presents for a postpartum visit. She is 6 weeks postpartum following a spontaneous vaginal delivery. I have fully reviewed the prenatal and intrapartum course. The delivery was at 39.2 gestational weeks. Outcome: spontaneous vaginal delivery. Anesthesia: epidural. Postpartum course has been good. Baby's course has been good. Baby is feeding by breast. Bleeding no bleeding. Bowel function is normal. Bladder function is normal. Patient is not sexually active. Contraception method is Nexplanon. Postpartum depression screening: negative.  The following portions of the patient's history were reviewed and updated as appropriate: allergies, current medications, past family history, past medical history, past social history, past surgical history and problem list.  Review of Systems Pertinent items are noted in HPI.   Objective:    BP 99/55 mmHg  Pulse 76  Temp(Src) 98.6 F (37 C)  Wt 155 lb 3.2 oz (70.398 kg)  Breastfeeding? Yes  General:  alert and cooperative   Breasts:     Lungs:    Heart:     Abdomen:     Vulva:  not evaluated  Vagina: not evaluated  Cervix:     Corpus: not examined  Adnexa:  not evaluated  Rectal Exam: Not performed.       Patient given informed consent, signed copy in the chart, time out was performed. Pregnancy test was neg. Appropriate time out taken.  Patient's left arm was prepped and draped in the usual sterile fashion.. The ruler used to measure and mark insertion area.  Pt was prepped with alcohol swab and then injected with 3 cc of 1% lidocaine with epinephrine.  Pt was prepped with betadine,Nexplanon removed form packaging,  Device confirmed in needle, then inserted full length of needle and withdrawn per handbook instructions.  Pt insertion site covered with gauze.   No blood loss.  Pt tolerated the procedure well.  Assessment:     normal postpartum exam. Pap  smear not done at today's visit.   Plan:    1. Contraception: Nexplanon 2. Instructions given 3. Follow up as needed.    Adam PhenixJames G Hersel Mcmeen, MD 02/05/2014

## 2014-02-05 NOTE — Patient Instructions (Signed)
Etonogestrel implant What is this medicine? ETONOGESTREL (et oh noe JES trel) is a contraceptive (birth control) device. It is used to prevent pregnancy. It can be used for up to 3 years. This medicine may be used for other purposes; ask your health care provider or pharmacist if you have questions. COMMON BRAND NAME(S): Implanon, Nexplanon What should I tell my health care provider before I take this medicine? They need to know if you have any of these conditions: -abnormal vaginal bleeding -blood vessel disease or blood clots -cancer of the breast, cervix, or liver -depression -diabetes -gallbladder disease -headaches -heart disease or recent heart attack -high blood pressure -high cholesterol -kidney disease -liver disease -renal disease -seizures -tobacco smoker -an unusual or allergic reaction to etonogestrel, other hormones, anesthetics or antiseptics, medicines, foods, dyes, or preservatives -pregnant or trying to get pregnant -breast-feeding How should I use this medicine? This device is inserted just under the skin on the inner side of your upper arm by a health care professional. Talk to your pediatrician regarding the use of this medicine in children. Special care may be needed. Overdosage: If you think you've taken too much of this medicine contact a poison control center or emergency room at once. Overdosage: If you think you have taken too much of this medicine contact a poison control center or emergency room at once. NOTE: This medicine is only for you. Do not share this medicine with others. What if I miss a dose? This does not apply. What may interact with this medicine? Do not take this medicine with any of the following medications: -amprenavir -bosentan -fosamprenavir This medicine may also interact with the following medications: -barbiturate medicines for inducing sleep or treating seizures -certain medicines for fungal infections like ketoconazole and  itraconazole -griseofulvin -medicines to treat seizures like carbamazepine, felbamate, oxcarbazepine, phenytoin, topiramate -modafinil -phenylbutazone -rifampin -some medicines to treat HIV infection like atazanavir, indinavir, lopinavir, nelfinavir, tipranavir, ritonavir -St. John's wort This list may not describe all possible interactions. Give your health care provider a list of all the medicines, herbs, non-prescription drugs, or dietary supplements you use. Also tell them if you smoke, drink alcohol, or use illegal drugs. Some items may interact with your medicine. What should I watch for while using this medicine? This product does not protect you against HIV infection (AIDS) or other sexually transmitted diseases. You should be able to feel the implant by pressing your fingertips over the skin where it was inserted. Tell your doctor if you cannot feel the implant. What side effects may I notice from receiving this medicine? Side effects that you should report to your doctor or health care professional as soon as possible: -allergic reactions like skin rash, itching or hives, swelling of the face, lips, or tongue -breast lumps -changes in vision -confusion, trouble speaking or understanding -dark urine -depressed mood -general ill feeling or flu-like symptoms -light-colored stools -loss of appetite, nausea -right upper belly pain -severe headaches -severe pain, swelling, or tenderness in the abdomen -shortness of breath, chest pain, swelling in a leg -signs of pregnancy -sudden numbness or weakness of the face, arm or leg -trouble walking, dizziness, loss of balance or coordination -unusual vaginal bleeding, discharge -unusually weak or tired -yellowing of the eyes or skin Side effects that usually do not require medical attention (Report these to your doctor or health care professional if they continue or are bothersome.): -acne -breast pain -changes in  weight -cough -fever or chills -headache -irregular menstrual bleeding -itching, burning, and   vaginal discharge -pain or difficulty passing urine -sore throat This list may not describe all possible side effects. Call your doctor for medical advice about side effects. You may report side effects to FDA at 1-800-FDA-1088. Where should I keep my medicine? This drug is given in a hospital or clinic and will not be stored at home. NOTE: This sheet is a summary. It may not cover all possible information. If you have questions about this medicine, talk to your doctor, pharmacist, or health care provider.  2015, [employer]. (2011-08-17 15:37:45)  

## 2014-02-09 ENCOUNTER — Encounter: Payer: Self-pay | Admitting: *Deleted

## 2014-03-26 ENCOUNTER — Encounter: Payer: Self-pay | Admitting: Family Medicine

## 2014-11-20 ENCOUNTER — Encounter (HOSPITAL_COMMUNITY): Payer: Self-pay | Admitting: Emergency Medicine

## 2014-11-20 ENCOUNTER — Emergency Department (HOSPITAL_COMMUNITY): Payer: Medicaid Other

## 2014-11-20 ENCOUNTER — Inpatient Hospital Stay (HOSPITAL_COMMUNITY)
Admission: EM | Admit: 2014-11-20 | Discharge: 2014-11-23 | DRG: 759 | Disposition: A | Discharge status: Home / Self Care | Payer: Medicaid Other | Attending: Obstetrics and Gynecology | Admitting: Obstetrics and Gynecology

## 2014-11-20 DIAGNOSIS — R109 Unspecified abdominal pain: Secondary | ICD-10-CM

## 2014-11-20 DIAGNOSIS — R51 Headache: Secondary | ICD-10-CM

## 2014-11-20 DIAGNOSIS — N7093 Salpingitis and oophoritis, unspecified: Secondary | ICD-10-CM

## 2014-11-20 DIAGNOSIS — R102 Pelvic and perineal pain: Secondary | ICD-10-CM

## 2014-11-20 DIAGNOSIS — R519 Headache, unspecified: Secondary | ICD-10-CM

## 2014-11-20 DIAGNOSIS — A599 Trichomoniasis, unspecified: Secondary | ICD-10-CM | POA: Diagnosis present

## 2014-11-20 DIAGNOSIS — D509 Iron deficiency anemia, unspecified: Secondary | ICD-10-CM | POA: Diagnosis present

## 2014-11-20 LAB — COMPREHENSIVE METABOLIC PANEL
ALT: 12 U/L — ABNORMAL LOW (ref 14–54)
AST: 22 U/L (ref 15–41)
Albumin: 3.6 g/dL (ref 3.5–5.0)
Alkaline Phosphatase: 61 U/L (ref 38–126)
Anion gap: 8 (ref 5–15)
BUN: 5 mg/dL — ABNORMAL LOW (ref 6–20)
CO2: 26 mmol/L (ref 22–32)
Calcium: 9 mg/dL (ref 8.9–10.3)
Chloride: 101 mmol/L (ref 101–111)
Creatinine, Ser: 0.9 mg/dL (ref 0.44–1.00)
GFR calc Af Amer: >60 mL/min (ref >60)
GFR calc non Af Amer: >60 mL/min (ref >60)
Glucose, Bld: 102 mg/dL — ABNORMAL HIGH (ref 65–99)
Potassium: 2.9 mmol/L — ABNORMAL LOW (ref 3.5–5.1)
Sodium: 135 mmol/L (ref 135–145)
Total Bilirubin: 0.3 mg/dL (ref 0.3–1.2)
Total Protein: 7.7 g/dL (ref 6.5–8.1)

## 2014-11-20 LAB — URINE MICROSCOPIC-ADD ON

## 2014-11-20 LAB — URINALYSIS, ROUTINE W REFLEX MICROSCOPIC
Bilirubin Urine: NEGATIVE
Glucose, UA: NEGATIVE mg/dL
Ketones, ur: NEGATIVE mg/dL
Nitrite: NEGATIVE
Protein, ur: 30 mg/dL — AB
Specific Gravity, Urine: 1.024 (ref 1.005–1.030)
Urobilinogen, UA: 1 mg/dL (ref 0.0–1.0)
pH: 6 (ref 5.0–8.0)

## 2014-11-20 LAB — CBC
HCT: 34.8 % — ABNORMAL LOW (ref 36.0–46.0)
Hemoglobin: 10.8 g/dL — ABNORMAL LOW (ref 12.0–15.0)
MCH: 22.7 pg — ABNORMAL LOW (ref 26.0–34.0)
MCHC: 31 g/dL (ref 30.0–36.0)
MCV: 73.1 fL — ABNORMAL LOW (ref 78.0–100.0)
Platelets: 264 10*3/uL (ref 150–400)
RBC: 4.76 MIL/uL (ref 3.87–5.11)
RDW: 15 % (ref 11.5–15.5)
WBC: 6.5 10*3/uL (ref 4.0–10.5)

## 2014-11-20 LAB — POC URINE PREG, ED: Preg Test, Ur: NEGATIVE

## 2014-11-20 LAB — LIPASE, BLOOD: Lipase: 24 U/L (ref 22–51)

## 2014-11-20 MED ORDER — SODIUM CHLORIDE 0.9 % IV BOLUS (SEPSIS)
1000.0000 mL | Freq: Once | INTRAVENOUS | Status: AC
  Administered 2014-11-20: 1000 mL via INTRAVENOUS

## 2014-11-20 MED ORDER — POTASSIUM CHLORIDE CRYS ER 20 MEQ PO TBCR
40.0000 meq | EXTENDED_RELEASE_TABLET | ORAL | Status: AC
  Administered 2014-11-20 – 2014-11-21 (×2): 40 meq via ORAL
  Filled 2014-11-20 (×2): qty 2

## 2014-11-20 MED ORDER — ONDANSETRON HCL 4 MG/2ML IJ SOLN
4.0000 mg | Freq: Once | INTRAMUSCULAR | Status: AC
  Administered 2014-11-20: 4 mg via INTRAVENOUS
  Filled 2014-11-20: qty 2

## 2014-11-20 MED ORDER — ONDANSETRON 4 MG PO TBDP
4.0000 mg | ORAL_TABLET | Freq: Once | ORAL | Status: AC | PRN
  Administered 2014-11-20: 4 mg via ORAL

## 2014-11-20 MED ORDER — MORPHINE SULFATE (PF) 4 MG/ML IV SOLN
4.0000 mg | Freq: Once | INTRAVENOUS | Status: AC
  Administered 2014-11-20: 4 mg via INTRAVENOUS
  Filled 2014-11-20: qty 1

## 2014-11-20 MED ORDER — ACETAMINOPHEN 325 MG PO TABS
650.0000 mg | ORAL_TABLET | Freq: Once | ORAL | Status: AC
  Administered 2014-11-20: 650 mg via ORAL

## 2014-11-20 MED ORDER — ONDANSETRON 4 MG PO TBDP
ORAL_TABLET | ORAL | Status: AC
  Filled 2014-11-20: qty 1

## 2014-11-20 MED ORDER — ACETAMINOPHEN 325 MG PO TABS
ORAL_TABLET | ORAL | Status: AC
  Filled 2014-11-20: qty 2

## 2014-11-20 NOTE — ED Provider Notes (Addendum)
CSN: [id number]     Arrival date & time 11/20/14  1920 History   First MD Initiated Contact with Patient 11/20/14 2109     Chief Complaint  Patient presents with  . Abdominal Pain     (Consider location/radiation/quality/duration/timing/severity/associated sxs/prior Treatment) Patient is a 19 y.o. female presenting with abdominal pain. The history is provided by the patient.  Abdominal Pain Pain location:  RLQ and RUQ Pain quality: sharp, shooting and squeezing   Pain radiates to:  Back Pain severity:  Moderate Onset quality:  Gradual Duration:  3 days Timing:  Constant Progression:  Worsening Chronicity:  New Relieved by:  Nothing Worsened by:  Nothing tried Ineffective treatments:  None tried Associated symptoms: anorexia, dysuria, fever and nausea   Associated symptoms: no chest pain, no chills, no constipation, no shortness of breath and no vomiting    19 yo F with a chief complaint of abdominal pain. This started about 4 days ago. His right upper and right lower. Pain comes and goes. Usually comes in severe makes her feel like she needs to vomit and then mildly resolved but never goes away. Has not had any episodes of emesis. Also having dysuria and some difficulty urinating. Having chills at home did not that she had fevers until she came here. Denies diarrhea. Denies vaginal bleeding vaginal discharge.  Past Medical History  Diagnosis Date  . Low iron   . Complication of anesthesia     has never had any type of anesthesia.  . Anemia     on Iron   Past Surgical History  Procedure Laterality Date  . No past surgeries     Family History  Problem Relation Age of Onset  . Alcohol abuse Neg Hx   . Arthritis Neg Hx   . Birth defects Neg Hx   . Cancer Neg Hx   . COPD Neg Hx   . Depression Neg Hx   . Diabetes Neg Hx   . Drug abuse Neg Hx   . Early death Neg Hx   . Hearing loss Neg Hx   . Heart disease Neg Hx   . Hyperlipidemia Neg Hx   . Hypertension Neg Hx   .  Kidney disease Neg Hx   . Learning disabilities Neg Hx   . Mental illness Neg Hx   . Mental retardation Neg Hx   . Miscarriages / Stillbirths Neg Hx   . Stroke Neg Hx   . Vision loss Neg Hx   . Varicose Veins Neg Hx   . Asthma Mother   . Asthma Sister   . Asthma Brother    Social History  Substance Use Topics  . Smoking status: Never Smoker   . Smokeless tobacco: Never Used  . Alcohol Use: No   OB History    Gravida Para Term Preterm AB TAB SAB Ectopic Multiple Living   0 0 0 0 0 0 1     Review of Systems  Constitutional: Positive for fever. Negative for chills.  HENT: Negative for congestion and rhinorrhea.   Eyes: Negative for redness and visual disturbance.  Respiratory: Negative for shortness of breath and wheezing.   Cardiovascular: Negative for chest pain and palpitations.  Gastrointestinal: Positive for nausea, abdominal pain and anorexia. Negative for vomiting and constipation.  Genitourinary: Positive for dysuria. Negative for urgency.  Musculoskeletal: Positive for back pain. Negative for myalgias and arthralgias.  Skin: Negative for pallor and wound.  Neurological: Negative for dizziness and headaches.  Allergies  Review of patient's allergies indicates no known allergies.  Home Medications   Prior to Admission medications   Medication Sig Start Date End Date Taking? Authorizing Provider  ibuprofen (ADVIL,MOTRIN) 600 MG tablet Take 1 tablet (600 mg total) by mouth every 6 (six) hours as needed. Patient not taking: Reported on 11/20/2014 12/25/13   Deirdre Colin Mulders, CNM  oxyCODONE-acetaminophen (PERCOCET/ROXICET) 5-325 MG per tablet Take 1 tablet by mouth every 4 (four) hours as needed for moderate pain or severe pain. Patient not taking: Reported on 02/05/2014 11/24/13   Ardith Dark, MD   BP 111/74 mmHg  Pulse 81  Temp(Src) 101.1 F (38.4 C) (Oral)  Resp 16  Ht  (1.651 m)  Wt 162 lb (73.483 kg)  BMI 26.96 kg/m2  SpO2 100%  LMP   Physical Exam  Constitutional: She is oriented to person, place, and time. She appears well-developed and well-nourished. No distress.  HENT:  Head: Normocephalic and atraumatic.  Eyes: EOM are normal. Pupils are equal, round, and reactive to light.  Neck: Normal range of motion. Neck supple.  Cardiovascular: Normal rate and regular rhythm.  Exam reveals no gallop and no friction rub.   No murmur heard. Pulmonary/Chest: Effort normal. She has no wheezes. She has no rales.  Abdominal: Soft. She exhibits no distension. There is tenderness (Diffusely tender but worse in the right upper quadrant. No noted flank tenderness. Patient points to her midline back no noted pain to the spinous processes.). There is no rebound and no guarding.  Genitourinary: Cervix exhibits discharge (clear, mucus). Cervix exhibits no motion tenderness. Right adnexum displays no mass and no tenderness. Left adnexum displays no mass and no tenderness.  Musculoskeletal: She exhibits no edema or tenderness.  Neurological: She is alert and oriented to person, place, and time.  Skin: Skin is warm and dry. She is not diaphoretic.  Psychiatric: She has a normal mood and affect. Her behavior is normal.    ED Course  Procedures (including critical care time) Labs Review Labs Reviewed  URINALYSIS, ROUTINE W REFLEX MICROSCOPIC (NOT AT [facility]) - Abnormal; Notable for the following:    APPearance CLOUDY (*)    Hgb urine dipstick LARGE (*)    Protein, ur 30 (*)    Leukocytes, UA MODERATE (*)    All other components within normal limits  COMPREHENSIVE METABOLIC PANEL - Abnormal; Notable for the following:    Potassium 2.9 (*)    Glucose, Bld 102 (*)    BUN 5 (*)    ALT 12 (*)    All other components within normal limits  CBC - Abnormal; Notable for the following:    Hemoglobin 10.8 (*)    HCT 34.8 (*)    MCV 73.1 (*)    MCH 22.7 (*)    All other components within normal limits  URINE MICROSCOPIC-ADD ON - Abnormal;  Notable for the following:    Bacteria, UA FEW (*)    All other components within normal limits  LIPASE, BLOOD  POC URINE PREG, ED    Imaging Review US Abdomen Limited Ruq  11/20/2014   CLINICAL DATA:  Right upper quadrant pain for 3 days  EXAM: US ABDOMEN LIMITED - RIGHT UPPER QUADRANT  COMPARISON:  None.  FINDINGS: Gallbladder:  No gallstones or wall thickening visualized. No sonographic Murphy sign noted.  Common bile duct:  Diameter: 3 mm  Liver:  No focal lesion identified. Within normal limits in parenchymal echogenicity.  IMPRESSION: No evidence of acute  cholecystitis.   Electronically Signed   By: Sherian Rein M.D.   On: 11/20/2014 23:01   I have personally reviewed and evaluated these images and lab results as part of my medical decision-making.   EKG Interpretation None      MDM   Final diagnoses:  Right flank pain  Acute nonintractable headache, unspecified headache type    19 yo F with right-sided pain. Patient's urine concerning for possible UTI with hematuria. With patient's fever and flank pain concern for possible infected stone. No history of kidney stones in the past. Pain worst in right upper quadrant on palpation. Patient having some anorexia and nausea difficulty to rule out cholecystitis. Will obtain right upper quadrant ultrasound.  Right upper quadrant ultrasound negative.  Patient called the nurse back into the room and asked whether or not she is to have a pelvic exam. Patient apparently also concerned about some pelvic discharge that she did not say that she had on initial history. Having some mild lower cramping as well. Denies laterality.  Given Rocephin, doxy for possible PID.   CT stone study concerning for right hydrosalpinx. Concern for tubo-ovarian abscess with fever and tachycardia.   Discussed case with Dr. Jolayne Panther on-call OB. Recommended cefoxitin and Flagyl. Transfer to women's.  The patients results and plan were reviewed and discussed.    Any x-rays performed were independently reviewed by myself.   Differential diagnosis were considered with the presenting HPI.  Medications  acetaminophen (TYLENOL) 325 MG tablet (not administered)  ondansetron (ZOFRAN-ODT) 4 MG disintegrating tablet (not administered)  cefTRIAXone (ROCEPHIN) 1 g in dextrose 5 % 50 mL IVPB (1 g Intravenous New Bag/Given 11/21/14 0035)  cefOXitin (MEFOXIN) 1 g in dextrose 5 % 50 mL IVPB (not administered)  metroNIDAZOLE (FLAGYL) IVPB 500 mg (not administered)  ondansetron (ZOFRAN-ODT) disintegrating tablet 4 mg (4 mg Oral Given 11/20/14 2000)  acetaminophen (TYLENOL) tablet 650 mg (650 mg Oral Given 11/20/14 2000)  sodium chloride 0.9 % bolus 1,000 mL (0 mLs Intravenous Stopped 11/20/14 2300)  morphine 4 MG/ML injection 4 mg (4 mg Intravenous Given 11/20/14 2139)  ondansetron (ZOFRAN) injection 4 mg (4 mg Intravenous Given 11/20/14 2139)  potassium chloride SA (K-DUR,KLOR-CON) CR tablet 40 mEq (40 mEq Oral Given 11/21/14 0034)  doxycycline (VIBRA-TABS) tablet 100 mg (100 mg Oral Given 11/21/14 0034)    Filed Vitals:   11/20/14 2230 11/20/14 2330 11/21/14 0000 11/21/14 0030  BP: 111/74 115/58 117/64 114/63  Pulse: 81 87 93   Temp:      TempSrc:      Resp:      Height:      Weight:      SpO2: 100% 100% 100%     Final diagnoses:  Right flank pain  Acute nonintractable headache, unspecified headache type  Right adnexal tenderness  TOA (tubo-ovarian abscess)    Admission/ observation were discussed with the admitting physician, patient and/or family and they are comfortable with the plan.    Melene Plan, DO 11/21/14 [phone]

## 2014-11-20 NOTE — ED Notes (Signed)
MD at bedside. 

## 2014-11-20 NOTE — ED Notes (Signed)
Patient transported to Ultrasound 

## 2014-11-20 NOTE — ED Notes (Signed)
Pt. reports RLQ/RUQ pain with nausea and fever onset 3 days ago , denies emesis or diarrhea , no dysuria or cough.

## 2014-11-21 ENCOUNTER — Encounter (HOSPITAL_COMMUNITY): Payer: Self-pay | Admitting: *Deleted

## 2014-11-21 ENCOUNTER — Emergency Department (HOSPITAL_COMMUNITY): Payer: Medicaid Other

## 2014-11-21 DIAGNOSIS — D509 Iron deficiency anemia, unspecified: Secondary | ICD-10-CM | POA: Diagnosis present

## 2014-11-21 DIAGNOSIS — A599 Trichomoniasis, unspecified: Secondary | ICD-10-CM | POA: Diagnosis present

## 2014-11-21 DIAGNOSIS — N7093 Salpingitis and oophoritis, unspecified: Secondary | ICD-10-CM | POA: Diagnosis not present

## 2014-11-21 LAB — HIV ANTIBODY (ROUTINE TESTING W REFLEX): HIV Screen 4th Generation wRfx: NONREACTIVE

## 2014-11-21 LAB — WET PREP, GENITAL
Clue Cells Wet Prep HPF POC: NONE SEEN
Yeast Wet Prep HPF POC: NONE SEEN

## 2014-11-21 LAB — GC/CHLAMYDIA PROBE AMP (~~LOC~~) NOT AT ARMC
Chlamydia: NEGATIVE
Chlamydia: NEGATIVE
Neisseria Gonorrhea: NEGATIVE
Neisseria Gonorrhea: NEGATIVE

## 2014-11-21 LAB — RAPID HIV SCREEN (HIV 1/2 AB+AG)
HIV 1/2 Antibodies: NONREACTIVE
HIV-1 P24 Antigen - HIV24: NONREACTIVE

## 2014-11-21 MED ORDER — LACTATED RINGERS IV SOLN
INTRAVENOUS | Status: DC
  Administered 2014-11-21 – 2014-11-23 (×6): via INTRAVENOUS

## 2014-11-21 MED ORDER — IBUPROFEN 600 MG PO TABS
600.0000 mg | ORAL_TABLET | Freq: Four times a day (QID) | ORAL | Status: DC | PRN
  Administered 2014-11-21 – 2014-11-22 (×4): 600 mg via ORAL
  Filled 2014-11-21 (×4): qty 1

## 2014-11-21 MED ORDER — ONDANSETRON HCL 4 MG/2ML IJ SOLN
4.0000 mg | Freq: Four times a day (QID) | INTRAMUSCULAR | Status: DC | PRN
  Administered 2014-11-21: 4 mg via INTRAVENOUS
  Filled 2014-11-21: qty 2

## 2014-11-21 MED ORDER — OXYCODONE-ACETAMINOPHEN 5-325 MG PO TABS
1.0000 | ORAL_TABLET | ORAL | Status: DC | PRN
  Administered 2014-11-21 (×2): 2 via ORAL
  Administered 2014-11-21: 1 via ORAL
  Filled 2014-11-21 (×2): qty 2
  Filled 2014-11-21: qty 1

## 2014-11-21 MED ORDER — DEXTROSE 5 % IV SOLN
1.0000 g | Freq: Once | INTRAVENOUS | Status: AC
  Administered 2014-11-21: 1 g via INTRAVENOUS
  Filled 2014-11-21: qty 1

## 2014-11-21 MED ORDER — DOXYCYCLINE HYCLATE 100 MG PO TABS
100.0000 mg | ORAL_TABLET | Freq: Two times a day (BID) | ORAL | Status: DC
  Administered 2014-11-21 – 2014-11-23 (×5): 100 mg via ORAL
  Filled 2014-11-21 (×6): qty 1

## 2014-11-21 MED ORDER — PRENATAL MULTIVITAMIN CH
1.0000 | ORAL_TABLET | Freq: Every day | ORAL | Status: DC
  Administered 2014-11-21 – 2014-11-23 (×3): 1 via ORAL
  Filled 2014-11-21 (×3): qty 1

## 2014-11-21 MED ORDER — ONDANSETRON HCL 4 MG PO TABS
4.0000 mg | ORAL_TABLET | Freq: Four times a day (QID) | ORAL | Status: DC | PRN
Start: 2014-11-21 — End: 2014-11-23

## 2014-11-21 MED ORDER — METRONIDAZOLE IN NACL 5-0.79 MG/ML-% IV SOLN
500.0000 mg | Freq: Once | INTRAVENOUS | Status: AC
  Administered 2014-11-21: 500 mg via INTRAVENOUS
  Filled 2014-11-21: qty 100

## 2014-11-21 MED ORDER — DEXTROSE 5 % IV SOLN
2.0000 g | Freq: Two times a day (BID) | INTRAVENOUS | Status: DC
  Administered 2014-11-21 – 2014-11-23 (×4): 2 g via INTRAVENOUS
  Filled 2014-11-21 (×5): qty 2

## 2014-11-21 MED ORDER — DEXTROSE 5 % IV SOLN
1.0000 g | Freq: Once | INTRAVENOUS | Status: AC
  Administered 2014-11-21: 1 g via INTRAVENOUS
  Filled 2014-11-21: qty 10

## 2014-11-21 MED ORDER — DOXYCYCLINE HYCLATE 100 MG PO TABS
100.0000 mg | ORAL_TABLET | Freq: Once | ORAL | Status: AC
  Administered 2014-11-21: 100 mg via ORAL
  Filled 2014-11-21: qty 1

## 2014-11-21 MED ORDER — METRONIDAZOLE 500 MG PO TABS
500.0000 mg | ORAL_TABLET | Freq: Four times a day (QID) | ORAL | Status: DC
  Administered 2014-11-21 – 2014-11-22 (×5): 500 mg via ORAL
  Filled 2014-11-21 (×6): qty 1

## 2014-11-21 NOTE — H&P (Signed)
Anita Robinson is an 19 y.o. female G1P1 presented to the ED secondary to worsening abdominal pain. Patient reports onset of worsening abdominal pain 3 days ago. She describes the pain as located on her right side. She describes it as a shouting pain that is Allee Busk and worsened in intensity over the past 3 days. She reports some nausea and decreased appetite. She denies any aggravating or alleviating factors.  Pertinent Gynecological History: Menses: regular every month without intermenstrual spotting Contraception: none DES exposure: denies Blood transfusions: none Sexually transmitted diseases: currently at risk  OB History: G1, P1   Menstrual History:  No LMP recorded.    Past Medical History  Diagnosis Date  . Low iron   . Complication of anesthesia     has never had any type of anesthesia.  . Anemia     on Iron    Past Surgical History  Procedure Laterality Date  . No past surgeries      Family History  Problem Relation Age of Onset  . Alcohol abuse Neg Hx   . Arthritis Neg Hx   . Birth defects Neg Hx   . Cancer Neg Hx   . COPD Neg Hx   . Depression Neg Hx   . Diabetes Neg Hx   . Drug abuse Neg Hx   . Early death Neg Hx   . Hearing loss Neg Hx   . Heart disease Neg Hx   . Hyperlipidemia Neg Hx   . Hypertension Neg Hx   . Kidney disease Neg Hx   . Learning disabilities Neg Hx   . Mental illness Neg Hx   . Mental retardation Neg Hx   . Miscarriages / Stillbirths Neg Hx   . Stroke Neg Hx   . Vision loss Neg Hx   . Varicose Veins Neg Hx   . Asthma Mother   . Asthma Sister   . Asthma Brother     Social History:  reports that she has never smoked. She has never used smokeless tobacco. She reports that she does not drink alcohol or use illicit drugs.  Allergies: No Known Allergies  Facility-administered medications prior to admission  Medication Dose Route Frequency Provider Last Rate Last Dose  . Tdap (BOOSTRIX) injection 0.5 mL  0.5 mL Intramuscular  Once Walidah N Karim, CNM       Prescriptions prior to admission  Medication Sig Dispense Refill Last Dose  . ibuprofen (ADVIL,MOTRIN) 600 MG tablet Take 1 tablet (600 mg total) by mouth every 6 (six) hours as needed. (Patient not taking: Reported on 11/20/2014) 30 tablet 1 Not Taking at Unknown time  . oxyCODONE-acetaminophen (PERCOCET/ROXICET) 5-325 MG per tablet Take 1 tablet by mouth every 4 (four) hours as needed for moderate pain or severe pain. (Patient not taking: Reported on 02/05/2014) 15 tablet 0 Not Taking at Unknown time    ROS See pertinent in HPI  Blood pressure 107/56, pulse 70, temperature 98.3 F (36.8 C), temperature source Oral, resp. rate 18, height  (1.651 m), weight 162 lb (73.483 kg), SpO2 100 %, currently breastfeeding. Physical Exam GENERAL: Well-developed, well-nourished female in no acute distress.  HEENT: Normocephalic, atraumatic. Sclerae anicteric.  NECK: Supple. Normal thyroid.  LUNGS: Clear to auscultation bilaterally.  HEART: Regular rate and rhythm. ABDOMEN: Soft, mild RLQ tenderness, nondistended. No rebound, no guarding. PELVIC: Normal external female genitalia. Uterus is normal in size. No adnexal mass or tenderness. EXTREMITIES: No cyanosis, clubbing, or edema, 2+ distal pulses.  Results for orders  placed or performed during the hospital encounter of 11/20/14 (from the past 24 hour(s))  Urinalysis, Routine w reflex microscopic (not at [facility])     Status: Abnormal   Collection Time: 11/20/14  7:45 PM  Result Value Ref Range   Color, Urine YELLOW YELLOW   APPearance CLOUDY (A) CLEAR   Specific Gravity, Urine 1.024 1.005 - 1.030   pH 6.0 5.0 - 8.0   Glucose, UA NEGATIVE NEGATIVE mg/dL   Hgb urine dipstick LARGE (A) NEGATIVE   Bilirubin Urine NEGATIVE NEGATIVE   Ketones, ur NEGATIVE NEGATIVE mg/dL   Protein, ur 30 (A) NEGATIVE mg/dL   Urobilinogen, UA 1.0 0.0 - 1.0 mg/dL   Nitrite NEGATIVE NEGATIVE   Leukocytes, UA MODERATE (A) NEGATIVE   Urine microscopic-add on     Status: Abnormal   Collection Time: 11/20/14  7:45 PM  Result Value Ref Range   Squamous Epithelial / LPF RARE RARE   WBC, UA 21-50 <3 WBC/hpf   RBC / HPF 11-20 <3 RBC/hpf   Bacteria, UA FEW (A) RARE   Urine-Other MUCOUS PRESENT   POC urine preg, ED (not at [facility])     Status: None   Collection Time: 11/20/14  7:54 PM  Result Value Ref Range   Preg Test, Ur NEGATIVE NEGATIVE  Lipase, blood     Status: None   Collection Time: 11/20/14  7:59 PM  Result Value Ref Range   Lipase 24 22 - 51 U/L  Comprehensive metabolic panel     Status: Abnormal   Collection Time: 11/20/14  7:59 PM  Result Value Ref Range   Sodium 135 135 - 145 mmol/L   Potassium 2.9 (L) 3.5 - 5.1 mmol/L   Chloride 101 101 - 111 mmol/L   CO2 26 22 - 32 mmol/L   Glucose, Bld 102 (H) 65 - 99 mg/dL   BUN 5 (L) 6 - 20 mg/dL   Creatinine, Ser 1.61 0.44 - 1.00 mg/dL   Calcium 9.0 8.9 - 09.6 mg/dL   Total Protein 7.7 6.5 - 8.1 g/dL   Albumin 3.6 3.5 - 5.0 g/dL   AST 22 15 - 41 U/L   ALT 12 (L) 14 - 54 U/L   Alkaline Phosphatase 61 38 - 126 U/L   Total Bilirubin 0.3 0.3 - 1.2 mg/dL   GFR calc non Af Amer >60 >60 mL/min   GFR calc Af Amer >60 >60 mL/min   Anion gap 8 5 - 15  CBC     Status: Abnormal   Collection Time: 11/20/14  7:59 PM  Result Value Ref Range   WBC 6.5 4.0 - 10.5 K/uL   RBC 4.76 3.87 - 5.11 MIL/uL   Hemoglobin 10.8 (L) 12.0 - 15.0 g/dL   HCT 04.5 (L) 40.9 - 81.1 %   MCV 73.1 (L) 78.0 - 100.0 fL   MCH 22.7 (L) 26.0 - 34.0 pg   MCHC 31.0 30.0 - 36.0 g/dL   RDW 91.4 78.2 - 95.6 %   Platelets 264 150 - 400 K/uL  Rapid HIV screen (HIV 1/2 Ab+Ag) ([facility] Only)     Status: None   Collection Time: 11/20/14 11:37 PM  Result Value Ref Range   HIV-1 P24 Antigen - HIV24 NON REACTIVE NON REACTIVE   HIV 1/2 Antibodies NON REACTIVE NON REACTIVE   Interpretation (HIV Ag Ab)      A non reactive test result means that HIV 1 or HIV 2 antibodies and HIV 1 p24 antigen were not detected in  the specimen.  Wet prep, genital     Status: Abnormal   Collection Time: 11/20/14 11:45 PM  Result Value Ref Range   Yeast Wet Prep HPF POC NONE SEEN NONE SEEN   Trich, Wet Prep MODERATE (A) NONE SEEN   Clue Cells Wet Prep HPF POC NONE SEEN NONE SEEN   WBC, Wet Prep HPF POC MODERATE (A) NONE SEEN    Ct Renal Stone Study  11/21/2014   CLINICAL DATA:  Right lower quadrant and right upper quadrant pain with nausea. Fever, onset 3 days ago  EXAM: CT ABDOMEN AND PELVIS WITHOUT CONTRAST  TECHNIQUE: Multidetector CT imaging of the abdomen and pelvis was performed following the standard protocol without IV contrast.  COMPARISON:  None.  FINDINGS: Lower chest and abdominal wall: Abdominal wall laxity, periumbilical.  Hepatobiliary: 1 cm cystic structure in the right liver. No perihepatic fluid.No evidence of biliary obstruction or stone.  Pancreas: Unremarkable.  Spleen: Unremarkable.  Adrenals/Urinary Tract: Negative adrenals. Few punctate bilateral renal calculi, nonobstructive. No evidence of ureteral stone. No hydronephrosis. Unremarkable bladder.  Reproductive:There is a tubular cystic structure in the right pelvis/adnexa measuring 34 x 22 mm. The right ovary has a normal appearance on fetal scan 08/02/2013. Indistinct and globular appearing uterus.  Stomach/Bowel:  No obstruction. No appendicitis.  Vascular/Lymphatic: No acute vascular abnormality. No mass or adenopathy.  Peritoneal: No ascites or pneumoperitoneum.  Musculoskeletal: No acute abnormalities.  IMPRESSION: 1. 34 mm right adnexal cyst with tubular structure raising the possibility of hydrosalpinx. Are there symptoms of PID? 2. Small bilateral renal calculi. 3. Normal appendix.   Electronically Signed   By: Marnee Spring M.D.   On: 11/21/2014 00:05   US Abdomen Limited Ruq  11/20/2014   CLINICAL DATA:  Right upper quadrant pain for 3 days  EXAM: US ABDOMEN LIMITED - RIGHT UPPER QUADRANT  COMPARISON:  None.  FINDINGS: Gallbladder:  No  gallstones or wall thickening visualized. No sonographic Murphy sign noted.  Common bile duct:  Diameter: 3 mm  Liver:  No focal lesion identified. Within normal limits in parenchymal echogenicity.  IMPRESSION: No evidence of acute cholecystitis.   Electronically Signed   By: Sherian Rein M.D.   On: 11/20/2014 23:01    Assessment/Plan: 19 yo G1P1 with TOA - Admit to med-surg - Parenteral antibiotic for the treatment of TOA - Flagyl also to cover trichomonas infection - Follow up Gonorrhea/chlamydia probe - Pain management prn - Discharge planning when afebrile for 24-48 hours  Temperence Zenor 11/21/2014, 7:13 AM

## 2014-11-21 NOTE — ED Notes (Signed)
[facility] contacted to tx patient to [facility]; Dr. Hoover Browns receiving

## 2014-11-22 DIAGNOSIS — N7093 Salpingitis and oophoritis, unspecified: Principal | ICD-10-CM

## 2014-11-22 MED ORDER — METRONIDAZOLE 500 MG PO TABS
500.0000 mg | ORAL_TABLET | Freq: Three times a day (TID) | ORAL | Status: DC
  Administered 2014-11-22 – 2014-11-23 (×3): 500 mg via ORAL
  Filled 2014-11-22 (×3): qty 1

## 2014-11-22 NOTE — Progress Notes (Signed)
[facility]N Attending Note  Subjective:  Patient reports improved pain, using less analgesics.  Ambulating, tolerating oral intake.  No other problems.  Admitted on 11/20/2014 for TOA (tubo-ovarian abscess) visualized in R adnexa.   Objective:  Blood pressure 103/50, pulse 76, temperature 98.6 F (37 C), temperature source Oral, resp. rate 20, height 5\' 5"  (1.651 m), weight 162 lb (73.483 kg), SpO2 100 %, currently breastfeeding.   Temp:  [97.6 F (36.4 C)-99.2 F (37.3 C)] 98.6 F (37 C) (09/29 1000) Pulse Rate:  [72-90] 76 (09/29 1000) Resp:  [18-20] 20 (09/29 1000) BP: (91-122)/(45-65) 103/50 mmHg (09/29 1000) SpO2:  [100 %] 100 % (09/29 1000)  Gen: NAD HENT: Normocephalic, atraumatic Lungs: Normal respiratory effort Heart: Regular rate noted Abdomen: soft, moderate tenderness to palpation in lower abdomen, L>R (contralateral to side of TOA), no rebound or guarding Cervix: Deferred Ext: 2+ DTRs, no edema, no cyanosis, negative Homan's sign  Results for orders placed or performed during the hospital encounter of 11/20/14 (from the past 48 hour(s))  Urinalysis, Routine w reflex microscopic (not at [facility])     Status: Abnormal   Collection Time: 11/20/14  7:45 PM  Result Value Ref Range   Color, Urine YELLOW YELLOW   APPearance CLOUDY (A) CLEAR   Specific Gravity, Urine 1.024 1.005 - 1.030   pH 6.0 5.0 - 8.0   Glucose, UA NEGATIVE NEGATIVE mg/dL   Hgb urine dipstick LARGE (A) NEGATIVE   Bilirubin Urine NEGATIVE NEGATIVE   Ketones, ur NEGATIVE NEGATIVE mg/dL   Protein, ur 30 (A) NEGATIVE mg/dL   Urobilinogen, UA 1.0 0.0 - 1.0 mg/dL   Nitrite NEGATIVE NEGATIVE   Leukocytes, UA MODERATE (A) NEGATIVE  Urine microscopic-add on     Status: Abnormal   Collection Time: 11/20/14  7:45 PM  Result Value Ref Range   Squamous Epithelial / LPF RARE RARE   WBC, UA 21-50 <3 WBC/hpf   RBC / HPF 11-20 <3 RBC/hpf   Bacteria, UA FEW (A) RARE   Urine-Other MUCOUS PRESENT   POC  urine preg, ED (not at [facility])     Status: None   Collection Time: 11/20/14  7:54 PM  Result Value Ref Range   Preg Test, Ur NEGATIVE NEGATIVE    Comment:        THE SENSITIVITY OF THIS METHODOLOGY IS >24 mIU/mL   Lipase, blood     Status: None   Collection Time: 11/20/14  7:59 PM  Result Value Ref Range   Lipase 24 22 - 51 U/L  Comprehensive metabolic panel     Status: Abnormal   Collection Time: 11/20/14  7:59 PM  Result Value Ref Range   Sodium 135 135 - 145 mmol/L   Potassium 2.9 (L) 3.5 - 5.1 mmol/L   Chloride 101 101 - 111 mmol/L   CO2 26 22 - 32 mmol/L   Glucose, Bld 102 (H) 65 - 99 mg/dL   BUN 5 (L) 6 - 20 mg/dL   Creatinine, Ser 11/22/14 0.44 - 1.00 mg/dL   Calcium 9.0 8.9 - 8.90 mg/dL   Total Protein 7.7 6.5 - 8.1 g/dL   Albumin 3.6 3.5 - 5.0 g/dL   AST 22 15 - 41 U/L   ALT 12 (L) 14 - 54 U/L   Alkaline Phosphatase 61 38 - 126 U/L   Total Bilirubin 0.3 0.3 - 1.2 mg/dL   GFR calc non Af Amer >60 >60 mL/min   GFR calc Af Amer >60 >60 mL/min  Comment: (NOTE) The eGFR has been calculated using the CKD EPI equation. This calculation has not been validated in all clinical situations. eGFR's persistently <60 mL/min signify possible Chronic Kidney Disease.    Anion gap 8 5 - 15  CBC     Status: Abnormal   Collection Time: 11/20/14  7:59 PM  Result Value Ref Range   WBC 6.5 4.0 - 10.5 K/uL   RBC 4.76 3.87 - 5.11 MIL/uL   Hemoglobin 10.8 (L) 12.0 - 15.0 g/dL   HCT 34.8 (L) 36.0 - 46.0 %   MCV 73.1 (L) 78.0 - 100.0 fL   MCH 22.7 (L) 26.0 - 34.0 pg   MCHC 31.0 30.0 - 36.0 g/dL   RDW 15.0 11.5 - 15.5 %   Platelets 264 150 - 400 K/uL  HIV antibody (routine testing) (NOT for [facility])     Status: None   Collection Time: 11/20/14 11:37 PM  Result Value Ref Range   HIV Screen 4th Generation wRfx Non Reactive Non Reactive    Comment: (NOTE) Performed At: [facility] [address] [place], [place] [id number] Lindon Romp MD YK:[id number]   Rapid HIV screen (HIV  1/2 Ab+Ag) ([place] Only)     Status: None   Collection Time: 11/20/14 11:37 PM  Result Value Ref Range   HIV-1 P24 Antigen - HIV24 NON REACTIVE NON REACTIVE   HIV 1/2 Antibodies NON REACTIVE NON REACTIVE   Interpretation (HIV Ag Ab)      A non reactive test result means that HIV 1 or HIV 2 antibodies and HIV 1 p24 antigen were not detected in the specimen.  Wet prep, genital     Status: Abnormal   Collection Time: 11/20/14 11:45 PM  Result Value Ref Range   Yeast Wet Prep HPF POC NONE SEEN NONE SEEN   Trich, Wet Prep MODERATE (A) NONE SEEN   Clue Cells Wet Prep HPF POC NONE SEEN NONE SEEN   WBC, Wet Prep HPF POC MODERATE (A) NONE SEEN  GC/Chlamydia probe amp ()not at [facility]     Status: None   Collection Time: 11/21/14 12:00 AM  Result Value Ref Range   Chlamydia Negative     Comment: Normal Reference Range - Negative   Neisseria gonorrhea Negative     Comment: Normal Reference Range - Negative   Ct Renal Stone Study  11/21/2014   CLINICAL DATA:  Right lower quadrant and right upper quadrant pain with nausea. Fever, onset 3 days ago  EXAM: CT ABDOMEN AND PELVIS WITHOUT CONTRAST  TECHNIQUE: Multidetector CT imaging of the abdomen and pelvis was performed following the standard protocol without IV contrast.  COMPARISON:  None.  FINDINGS: Lower chest and abdominal wall: Abdominal wall laxity, periumbilical.  Hepatobiliary: 1 cm cystic structure in the right liver. No perihepatic fluid.No evidence of biliary obstruction or stone.  Pancreas: Unremarkable.  Spleen: Unremarkable.  Adrenals/Urinary Tract: Negative adrenals. Few punctate bilateral renal calculi, nonobstructive. No evidence of ureteral stone. No hydronephrosis. Unremarkable bladder.  Reproductive:There is a tubular cystic structure in the right pelvis/adnexa measuring 34 x 22 mm. The right ovary has a normal appearance on fetal scan 08/02/2013. Indistinct and globular appearing uterus.  Stomach/Bowel:  No obstruction. No  appendicitis.  Vascular/Lymphatic: No acute vascular abnormality. No mass or adenopathy.  Peritoneal: No ascites or pneumoperitoneum.  Musculoskeletal: No acute abnormalities.  IMPRESSION: 1. 34 mm right adnexal cyst with tubular structure raising the possibility of hydrosalpinx. Are there symptoms of PID? 2. Small bilateral renal  calculi. 3. Normal appendix.   Electronically Signed   By: Monte Fantasia M.D.   On: 11/21/2014 00:05   US Abdomen Limited Ruq  11/20/2014   CLINICAL DATA:  Right upper quadrant pain for 3 days  EXAM: US ABDOMEN LIMITED - RIGHT UPPER QUADRANT  COMPARISON:  None.  FINDINGS: Gallbladder:  No gallstones or wall thickening visualized. No sonographic Murphy sign noted.  Common bile duct:  Diameter: 3 mm  Liver:  No focal lesion identified. Within normal limits in parenchymal echogenicity.  IMPRESSION: No evidence of acute cholecystitis.   Electronically Signed   By: Abelardo Diesel M.D.   On: 11/20/2014 23:01    Assessment & Plan:  19 y.o. G1P1001 admitted for TOA, also found to have Trichomonas. Afebrile since admission. - Continue IV Cefotetan and  PO Doxycycline and Metronidazole.  Will discontinue Cefotetan if remains afebrile tomorrow and continue oral antibiotics for 14 days. - Continue pain medications as needed - Encourage OOB - Continue close observation, plan for discharge tomorrow if remains stable and pain continues to improve.   Verita Schneiders, MD, [facility] Attending [place], [facility]

## 2014-11-23 MED ORDER — DOXYCYCLINE HYCLATE 100 MG PO CAPS: 100.0000 mg | ORAL_CAPSULE | Freq: Two times a day (BID) | ORAL | Status: DC

## 2014-11-23 MED ORDER — METRONIDAZOLE 500 MG PO TABS: 500.0000 mg | ORAL_TABLET | Freq: Three times a day (TID) | ORAL | Status: DC

## 2014-11-23 NOTE — Discharge Summary (Signed)
Physician Discharge Summary  Patient ID: Anita Robinson MRN: [id number] DOB/AGE: 10/13/1995 19 y.o.  Admit date: 11/20/2014 Discharge date: 11/23/2014   Discharge Diagnoses:  Principal Problem:   TOA (tubo-ovarian abscess) Active Problems:   Trichomoniasis   Consults: None  Significant Diagnostic Studies: labs:  CBC    Component Value Date/Time   WBC 6.5 11/20/2014 1959   RBC 4.76 11/20/2014 1959   HGB 10.8* 11/20/2014 1959   HCT 34.8* 11/20/2014 1959   PLT 264 11/20/2014 1959   MCV 73.1* 11/20/2014 1959   MCH 22.7* 11/20/2014 1959   MCHC 31.0 11/20/2014 1959   RDW 15.0 11/20/2014 1959   LYMPHSABS 1.2 05/23/2013 0920   MONOABS 0.4 05/23/2013 0920   EOSABS 0.1 05/23/2013 0920   BASOSABS 0.0 05/23/2013 0920    GC neg chlam neg HIV neg Wet prep-trich  Ct Renal Stone Study  11/21/2014   CLINICAL DATA:  Right lower quadrant and right upper quadrant pain with nausea. Fever, onset 3 days ago  EXAM: CT ABDOMEN AND PELVIS WITHOUT CONTRAST  TECHNIQUE: Multidetector CT imaging of the abdomen and pelvis was performed following the standard protocol without IV contrast.  COMPARISON:  None.  FINDINGS: Lower chest and abdominal wall: Abdominal wall laxity, periumbilical.  Hepatobiliary: 1 cm cystic structure in the right liver. No perihepatic fluid.No evidence of biliary obstruction or stone.  Pancreas: Unremarkable.  Spleen: Unremarkable.  Adrenals/Urinary Tract: Negative adrenals. Few punctate bilateral renal calculi, nonobstructive. No evidence of ureteral stone. No hydronephrosis. Unremarkable bladder.  Reproductive:There is a tubular cystic structure in the right pelvis/adnexa measuring 34 x 22 mm. The right ovary has a normal appearance on fetal scan 08/02/2013. Indistinct and globular appearing uterus.  Stomach/Bowel:  No obstruction. No appendicitis.  Vascular/Lymphatic: No acute vascular abnormality. No mass or adenopathy.  Peritoneal: No ascites or pneumoperitoneum.   Musculoskeletal: No acute abnormalities.  IMPRESSION: 1. 34 mm right adnexal cyst with tubular structure raising the possibility of hydrosalpinx. Are there symptoms of PID? 2. Small bilateral renal calculi. 3. Normal appendix.   Electronically Signed   By: Marnee Spring M.D.   On: 11/21/2014 00:05   US Abdomen Limited Ruq  11/20/2014   CLINICAL DATA:  Right upper quadrant pain for 3 days  EXAM: US ABDOMEN LIMITED - RIGHT UPPER QUADRANT  COMPARISON:  None.  FINDINGS: Gallbladder:  No gallstones or wall thickening visualized. No sonographic Murphy sign noted.  Common bile duct:  Diameter: 3 mm  Liver:  No focal lesion identified. Within normal limits in parenchymal echogenicity.  IMPRESSION: No evidence of acute cholecystitis.   Electronically Signed   By: Sherian Rein M.D.   On: 11/20/2014 23:01    Hospital Course: Admitted with presumed TOA due to findings on CT scan. Had fever on presentation, but noone x 48 hours following, without elevated WBC. She was begun on Cefotan and Flagyl. She was ambulatory, tolerating regular diet. Pain improved, not requiring any pain medication and was deemed stable for discharge.  Discharge Exam: Blood pressure 104/63, pulse 63, temperature 98.4 F (36.9 C), temperature source Oral, resp. rate 18, height  (1.651 m), weight 162 lb (73.483 kg), SpO2 100 %, currently breastfeeding. General appearance: alert and no distress  Resp: clear to auscultation bilaterally  Cardio: regular rate and rhythm  GI: soft, non-tender Extremities: Homans sign is negative, no sign of DVT  Treatments: IV hydration and antibiotics: metronidazole and Cefotan   Disposition: 01-Home or Self Care  DischaKaelie Henigantion: good  Discharge Instructions  Call MD for:  persistant nausea and vomiting    Complete by:  As directed      Call MD for:  severe uncontrolled pain    Complete by:  As directed      Call MD for:  temperature >100.4    Complete by:  As directed      Diet -  low sodium heart healthy    Complete by:  As directed      Increase activity slowly    Complete by:  As directed      No wound care    Complete by:  As directed      Sexual Activity Restrictions    Complete by:  As directed   None x 1 wk            Medication List    STOP taking these medications        oxyCODONE-acetaminophen 5-325 MG tablet  Commonly known as:  PERCOCET/ROXICET      TAKE these medications        doxycycline 100 MG capsule  Commonly known as:  VIBRAMYCIN  Take 1 capsule (100 mg total) by mouth 2 (two) times daily.     ibuprofen 600 MG tablet  Commonly known as:  ADVIL,MOTRIN  Take 1 tablet (600 mg total) by mouth every 6 (six) hours as needed.     metroNIDAZOLE 500 MG tablet  Commonly known as:  FLAGYL  Take 1 tablet (500 mg total) by mouth 3 (three) times daily.           Follow-up Information    Follow up with [facility] In 2 weeks.   Specialty:  Obstetrics and Gynecology   Why:  they will call you with an appointment, Hospital follow-up   Contact information:   [address] [place] [address] [phone]      Signed: Reva Bores 11/23/2014, 11:58 AM

## 2014-11-23 NOTE — Discharge Instructions (Signed)
Trichomoniasis °Trichomoniasis is an infection caused by an organism called Trichomonas. The infection can affect both women and men. In women, the outer female genitalia and the vagina are affected. In men, the penis is mainly affected, but the prostate and other reproductive organs can also be involved. Trichomoniasis is a sexually transmitted infection (STI) and is most often passed to another person through sexual contact.  °RISK FACTORS °· Having unprotected sexual intercourse. °· Having sexual intercourse with an infected partner. °SIGNS AND SYMPTOMS  °Symptoms of trichomoniasis in women include: °· Abnormal gray-green frothy vaginal discharge. °· Itching and irritation of the vagina. °· Itching and irritation of the area outside the vagina. °Symptoms of trichomoniasis in men include:  °· Penile discharge with or without pain. °· Pain during urination. This results from inflammation of the urethra. °DIAGNOSIS  °Trichomoniasis may be found during a Pap test or physical exam. Your health care provider may use one of the following methods to help diagnose this infection: °· Examining vaginal discharge under a microscope. For men, urethral discharge would be examined. °· Testing the pH of the vagina with a test tape. °· Using a vaginal swab test that checks for the Trichomonas organism. A test is available that provides results within a few minutes. °· Doing a culture test for the organism. This is not usually needed. °TREATMENT  °· You may be given medicine to fight the infection. Women should inform their health care provider if they could be or are pregnant. Some medicines used to treat the infection should not be taken during pregnancy. °· Your health care provider may recommend over-the-counter medicines or creams to decrease itching or irritation. °· Your sexual partner will need to be treated if infected. °HOME CARE INSTRUCTIONS  °· Take medicines only as directed by your health care provider. °· Take  over-the-counter medicine for itching or irritation as directed by your health care provider. °· Do not have sexual intercourse while you have the infection. °· Women should not douche or wear tampons while they have the infection. °· Discuss your infection with your partner. Your partner may have gotten the infection from you, or you may have gotten it from your partner. °· Have your sex partner get examined and treated if necessary. °· Practice safe, informed, and protected sex. °· See your health care provider for other STI testing. °SEEK MEDICAL CARE IF:  °· You still have symptoms after you finish your medicine. °· You develop abdominal pain. °· You have pain when you urinate. °· You have bleeding after sexual intercourse. °· You develop a rash. °· Your medicine makes you sick or makes you throw up (vomit). °MAKE SURE YOU: °· Understand these instructions. °· Will watch your condition. °· Will get help right away if you are not doing well or get worse. °Document Released: 08/05/2000 Document Revised: 06/26/2013 Document Reviewed: 11/21/2012 °ExitCare® Patient Information ©2015 [employer]. This information is not intended to replace advice given to you by your health care provider. Make sure you discuss any questions you have with your health care provider. °Pelvic Inflammatory Disease °Pelvic inflammatory disease (PID) refers to an infection in some or all of the female organs. The infection can be in the uterus, ovaries, fallopian tubes, or the surrounding tissues in the pelvis. PID can cause abdominal or pelvic pain that comes on suddenly (acute pelvic pain). PID is a serious infection because it can lead to lasting (chronic) pelvic pain or the inability to have children (infertile).  °CAUSES  °  The infection is often caused by the normal bacteria found in the vaginal tissues. PID may also be caused by an infection that is spread during sexual contact. PID can also occur following:  °· The birth of a baby.    °· A miscarriage.   °· An abortion.   °· Major pelvic surgery.   °· The use of an intrauterine device (IUD).   °· A sexual assault.   °RISK FACTORS °Certain factors can put a person at higher risk for PID, such as: °· Being younger than 25 years. °· Being sexually active at a young age. °· Using nonbarrier contraception. °· Having multiple sexual partners. °· Having sex with someone who has symptoms of a genital infection. °· Using oral contraception. °Other times, certain behaviors can increase the possibility of getting PID, such as: °· Having sex during your period. °· Using a vaginal douche. °· Having an intrauterine device (IUD) in place. °SYMPTOMS  °· Abdominal or pelvic pain.   °· Fever.   °· Chills.   °· Abnormal vaginal discharge. °· Abnormal uterine bleeding.   °· Unusual pain shortly after finishing your period. °DIAGNOSIS  °Your caregiver will choose some of the following methods to make a diagnosis, such as:  °· Performing a physical exam and history. A pelvic exam typically reveals a very tender uterus and surrounding pelvis.   °· Ordering laboratory tests including a pregnancy test, blood tests, and urine test.  °· Ordering cultures of the vagina and cervix to check for a sexually transmitted infection (STI). °· Performing an ultrasound.   °· Performing a laparoscopic procedure to look inside the pelvis.   °TREATMENT  °· Antibiotic medicines may be prescribed and taken by mouth.   °· Sexual partners may be treated when the infection is caused by a sexually transmitted disease (STD).   °· Hospitalization may be needed to give antibiotics intravenously. °· Surgery may be needed, but this is rare. °It may take weeks until you are completely well. If you are diagnosed with PID, you should also be checked for human immunodeficiency virus (HIV).   °HOME CARE INSTRUCTIONS  °· If given, take your antibiotics as directed. Finish the medicine even if you start to feel better.   °· Only take over-the-counter  or prescription medicines for pain, discomfort, or fever as directed by your caregiver.   °· Do not have sexual intercourse until treatment is completed or as directed by your caregiver. If PID is confirmed, your recent sexual partner(s) will need treatment.   °· Keep your follow-up appointments. °SEEK MEDICAL CARE IF:  °· You have increased or abnormal vaginal discharge.   °· You need prescription medicine for your pain.   °· You vomit.   °· You cannot take your medicines.   °· Your partner has an STD.   °SEEK IMMEDIATE MEDICAL CARE IF:  °· You have a fever.   °· You have increased abdominal or pelvic pain.   °· You have chills.   °· You have pain when you urinate.   °· You are not better after 72 hours following treatment.   °MAKE SURE YOU:  °· Understand these instructions. °· Will watch your condition. °· Will get help right away if you are not doing well or get worse. °·  °Document Released: 02/09/2005 Document Revised: 06/06/2012 Document Reviewed: 02/05/2011 °ExitCare® Patient Information ©2015 [employer]. This information is not intended to replace advice given to you by your health care provider. Make sure you discuss any questions you have with your health care provider. ° °

## 2014-11-23 NOTE — Progress Notes (Signed)
Pt is discharged in the care of friend. ,with N.T. Escort. Denies any pain or discomfort. Spirits are good. Discharged instructions with Rx were given to pt. Questions were asked and answered. Stable. Informed to take all antibitics.

## 2014-12-10 ENCOUNTER — Ambulatory Visit (INDEPENDENT_AMBULATORY_CARE_PROVIDER_SITE_OTHER): Payer: Self-pay | Admitting: Obstetrics and Gynecology

## 2014-12-10 VITALS — BP 107/55 | HR 97 | Temp 99.2°F | Wt 162.9 lb

## 2014-12-10 DIAGNOSIS — N7093 Salpingitis and oophoritis, unspecified: Secondary | ICD-10-CM

## 2014-12-10 NOTE — Progress Notes (Signed)
Patient ID: Anita DarterKarla Francis, female   DOB: [birth year], 19 y.o.   MRN: [id number] 19 yo G1P1 presenting today as a hospital follow up following admission on 11/21/2014 for treatment of TOA. Patient completed a 14-day course of doxycycline and reports complete resolution of her abdominal pain. She is sexually active using Nexplanon for contraception without any complaints.   Past Medical History  Diagnosis Date  . Low iron   . Complication of anesthesia     has never had any type of anesthesia.  . Anemia     on Iron   Past Surgical History  Procedure Laterality Date  . No past surgeries     Family History  Problem Relation Age of Onset  . Alcohol abuse Neg Hx   . Arthritis Neg Hx   . Birth defects Neg Hx   . Cancer Neg Hx   . COPD Neg Hx   . Depression Neg Hx   . Diabetes Neg Hx   . Drug abuse Neg Hx   . Early death Neg Hx   . Hearing loss Neg Hx   . Heart disease Neg Hx   . Hyperlipidemia Neg Hx   . Hypertension Neg Hx   . Kidney disease Neg Hx   . Learning disabilities Neg Hx   . Mental illness Neg Hx   . Mental retardation Neg Hx   . Miscarriages / Stillbirths Neg Hx   . Stroke Neg Hx   . Vision loss Neg Hx   . Varicose Veins Neg Hx   . Asthma Mother   . Asthma Sister   . Asthma Brother    Social History  Substance Use Topics  . Smoking status: Never Smoker   . Smokeless tobacco: Never Used  . Alcohol Use: No   ROS See pertinent on HPI  Blood pressure 107/55, pulse 97, temperature 99.2 F (37.3 C), weight 162 lb 14.4 oz (73.891 kg), currently breastfeeding.  GENERAL: Well-developed, well-nourished female in no acute distress.  ABDOMEN: Soft, nontender, nondistended. No organomegaly. EXTREMITIES: No cyanosis, clubbing, or edema, 2+ distal pulses.  A/P 19 yo s/p recent hospitalization for TOA - Reviewed results with patient - Recommended use of condoms with every sexual encounter - RTC for pap smear or prn

## 2015-11-21 ENCOUNTER — Encounter (HOSPITAL_COMMUNITY): Payer: Self-pay | Admitting: Emergency Medicine

## 2015-11-21 ENCOUNTER — Emergency Department (HOSPITAL_COMMUNITY)
Admission: EM | Admit: 2015-11-21 | Discharge: 2015-11-21 | Disposition: A | Discharge status: Home / Self Care | Payer: Self-pay | Attending: Emergency Medicine | Admitting: Emergency Medicine

## 2015-11-21 ENCOUNTER — Emergency Department (HOSPITAL_COMMUNITY): Payer: Self-pay

## 2015-11-21 DIAGNOSIS — N760 Acute vaginitis: Secondary | ICD-10-CM | POA: Insufficient documentation

## 2015-11-21 DIAGNOSIS — B9689 Other specified bacterial agents as the cause of diseases classified elsewhere: Secondary | ICD-10-CM

## 2015-11-21 DIAGNOSIS — N939 Abnormal uterine and vaginal bleeding, unspecified: Secondary | ICD-10-CM | POA: Insufficient documentation

## 2015-11-21 DIAGNOSIS — R102 Pelvic and perineal pain: Secondary | ICD-10-CM

## 2015-11-21 LAB — PREGNANCY, URINE: Preg Test, Ur: NEGATIVE

## 2015-11-21 LAB — COMPREHENSIVE METABOLIC PANEL
ALT: 13 U/L — ABNORMAL LOW (ref 14–54)
AST: 22 U/L (ref 15–41)
Albumin: 4.3 g/dL (ref 3.5–5.0)
Alkaline Phosphatase: 64 U/L (ref 38–126)
Anion gap: 7 (ref 5–15)
BUN: 9 mg/dL (ref 6–20)
CO2: 27 mmol/L (ref 22–32)
Calcium: 9.3 mg/dL (ref 8.9–10.3)
Chloride: 106 mmol/L (ref 101–111)
Creatinine, Ser: 0.68 mg/dL (ref 0.44–1.00)
GFR calc Af Amer: >60 mL/min (ref >60)
GFR calc non Af Amer: >60 mL/min (ref >60)
Glucose, Bld: 83 mg/dL (ref 65–99)
Potassium: 3.1 mmol/L — ABNORMAL LOW (ref 3.5–5.1)
Sodium: 140 mmol/L (ref 135–145)
Total Bilirubin: 0.5 mg/dL (ref 0.3–1.2)
Total Protein: 8.3 g/dL — ABNORMAL HIGH (ref 6.5–8.1)

## 2015-11-21 LAB — URINALYSIS, ROUTINE W REFLEX MICROSCOPIC
Bilirubin Urine: NEGATIVE
Glucose, UA: NEGATIVE mg/dL
Ketones, ur: 40 mg/dL — AB
Nitrite: NEGATIVE
Protein, ur: NEGATIVE mg/dL
Specific Gravity, Urine: 1.021 (ref 1.005–1.030)
pH: 6 (ref 5.0–8.0)

## 2015-11-21 LAB — WET PREP, GENITAL
Sperm: NONE SEEN
Trich, Wet Prep: NONE SEEN
WBC, Wet Prep HPF POC: NONE SEEN
Yeast Wet Prep HPF POC: NONE SEEN

## 2015-11-21 LAB — URINE MICROSCOPIC-ADD ON

## 2015-11-21 LAB — CBC
HCT: 34.7 % — ABNORMAL LOW (ref 36.0–46.0)
Hemoglobin: 10.6 g/dL — ABNORMAL LOW (ref 12.0–15.0)
MCH: 22.8 pg — ABNORMAL LOW (ref 26.0–34.0)
MCHC: 30.5 g/dL (ref 30.0–36.0)
MCV: 74.6 fL — ABNORMAL LOW (ref 78.0–100.0)
Platelets: 311 10*3/uL (ref 150–400)
RBC: 4.65 MIL/uL (ref 3.87–5.11)
RDW: 15 % (ref 11.5–15.5)
WBC: 4.7 10*3/uL (ref 4.0–10.5)

## 2015-11-21 LAB — LIPASE, BLOOD: Lipase: 23 U/L (ref 11–51)

## 2015-11-21 MED ORDER — MEGESTROL ACETATE 40 MG PO TABS
40.0000 mg | ORAL_TABLET | Freq: Every day | ORAL | Status: DC
  Administered 2015-11-21: 40 mg via ORAL
  Filled 2015-11-21: qty 1

## 2015-11-21 MED ORDER — METRONIDAZOLE 500 MG PO TABS: 500.0000 mg | ORAL_TABLET | Freq: Two times a day (BID) | ORAL | 0 refills | Status: DC

## 2015-11-21 NOTE — ED Triage Notes (Signed)
Pt reports moderate vaginal bleeding x1 month and generalized abdominal pain x2 weeks. Denies N/V/D, dizziness. Pt reports saturating approx 1 pad per hour.

## 2015-11-21 NOTE — ED Provider Notes (Signed)
[facility]-EMERGENCY DEPT Provider Note   CSN: [id number] Arrival date & time: 11/21/15  1324     History   Chief Complaint Chief Complaint  Patient presents with  . Vaginal Bleeding  . Abdominal Pain    HPI Anita Robinson is a 20 y.o. female who presents with vaginal bleeding and abdominal pain. PMH significant for hx of STD, hx of tubo-ovarian abscess, iron deficiency anemia. She states that she had a Nexplanon placed 2 years ago after the birth of her child. She has had irregular bleeding since then. For the past month she has had vaginal bleeding. She has gone through a pad ever 2 hours. She stated having pelvic pain 3 days ago. It is constant, cramping. She also endorses LUQ pain which started 2 days ago. Reports pain with sex and vaginal discharge. Denies fever, chills, chest pain, SOB, flank pain, N/V/D, irritative voiding symptoms.  HPI  Past Medical History:  Diagnosis Date  . Anemia    on Iron  . Complication of anesthesia    has never had any type of anesthesia.  . Low iron     Patient Active Problem List   Diagnosis Date Noted  . TOA (tubo-ovarian abscess) 11/21/2014  . Trichomoniasis 08/08/2013    Past Surgical History:  Procedure Laterality Date  . NO PAST SURGERIES      OB History    Gravida Para Term Preterm AB Living   1 1 1  0 0 1   SAB TAB Ectopic Multiple Live Births   0 0 0 0 1       Home Medications    Prior to Admission medications   Medication Sig Start Date End Date Taking? Authorizing Provider  doxycycline (VIBRAMYCIN) 100 MG capsule Take 1 capsule (100 mg total) by mouth 2 (two) times daily. Patient not taking: Reported on 11/21/2015 11/23/14   Reva Bores, MD  ibuprofen (ADVIL,MOTRIN) 600 MG tablet Take 1 tablet (600 mg total) by mouth every 6 (six) hours as needed. Patient not taking: Reported on 11/21/2015 12/25/13   Deirdre C Poe, CNM  metroNIDAZOLE (FLAGYL) 500 MG tablet Take 1 tablet (500 mg total) by mouth 3 (three) times  daily. Patient not taking: Reported on 11/21/2015 11/23/14   Reva Bores, MD    Family History Family History  Problem Relation Age of Onset  . Asthma Mother   . Asthma Sister   . Asthma Brother   . Alcohol abuse Neg Hx   . Arthritis Neg Hx   . Birth defects Neg Hx   . Cancer Neg Hx   . COPD Neg Hx   . Depression Neg Hx   . Diabetes Neg Hx   . Drug abuse Neg Hx   . Early death Neg Hx   . Hearing loss Neg Hx   . Heart disease Neg Hx   . Hyperlipidemia Neg Hx   . Hypertension Neg Hx   . Kidney disease Neg Hx   . Learning disabilities Neg Hx   . Mental illness Neg Hx   . Mental retardation Neg Hx   . Miscarriages / Stillbirths Neg Hx   . Stroke Neg Hx   . Vision loss Neg Hx   . Varicose Veins Neg Hx     Social History Social History  Substance Use Topics  . Smoking status: Never Smoker  . Smokeless tobacco: Never Used  . Alcohol use No     Allergies   Review of patient's allergies indicates no known allergies.  Review of Systems Review of Systems  Constitutional: Negative for chills and fever.  Respiratory: Negative for shortness of breath.   Cardiovascular: Negative for chest pain.  Gastrointestinal: Positive for abdominal pain. Negative for diarrhea, nausea and vomiting.  Genitourinary: Positive for dyspareunia, pelvic pain, vaginal bleeding, vaginal discharge and vaginal pain. Negative for dysuria and flank pain.  All other systems reviewed and are negative.    Physical Exam Updated Vital Signs BP 137/74 (BP Location: Left Arm)   Pulse 102   Temp 98.7 F (37.1 C) (Oral)   Resp 18   Ht 5\' 6"  (1.676 m)   Wt 76.2 kg   LMP 11/21/2015   SpO2 100%   BMI 27.12 kg/m   Physical Exam  Constitutional: She is oriented to person, place, and time. She appears well-developed and well-nourished. No distress.  HENT:  Head: Normocephalic and atraumatic.  Eyes: Conjunctivae are normal. Pupils are equal, round, and reactive to light. Right eye exhibits no  discharge. Left eye exhibits no discharge. No scleral icterus.  Neck: Normal range of motion. Neck supple.  Cardiovascular: Normal rate and regular rhythm.  Exam reveals no gallop and no friction rub.   No murmur heard. Pulmonary/Chest: Effort normal and breath sounds normal. No respiratory distress. She has no wheezes. She has no rales. She exhibits no tenderness.  Abdominal: Soft. Bowel sounds are normal. She exhibits no distension and no mass. There is tenderness. There is no rebound and no guarding. No hernia.  LUQ and suprapubic tenderness. No CVA tenderness  Genitourinary:  Genitourinary Comments: No inguinal lymphadenopathy or inguinal hernia noted. Normal external genitalia. No pain with speculum insertion. Closed cervical os with normal appearance - no rash or lesions. No significant discharge. Minimal amount of bleeding noted from cervix and in vaginal vault. On bimanual examination no adnexal tenderness or cervical motion tenderness. Chaperone present during exam.    Musculoskeletal: She exhibits no edema.  Neurological: She is alert and oriented to person, place, and time.  Skin: Skin is warm and dry.  Psychiatric: She has a normal mood and affect. Her behavior is normal.  Nursing note and vitals reviewed.    ED Treatments / Results  Labs (all labs ordered are listed, but only abnormal results are displayed) Labs Reviewed  WET PREP, GENITAL - Abnormal; Notable for the following:       Result Value   Clue Cells Wet Prep HPF POC PRESENT (*)    All other components within normal limits  COMPREHENSIVE METABOLIC PANEL - Abnormal; Notable for the following:    Potassium 3.1 (*)    Total Protein 8.3 (*)    ALT 13 (*)    All other components within normal limits  CBC - Abnormal; Notable for the following:    Hemoglobin 10.6 (*)    HCT 34.7 (*)    MCV 74.6 (*)    MCH 22.8 (*)    All other components within normal limits  URINALYSIS, ROUTINE W REFLEX MICROSCOPIC (NOT AT [facility]) -  Abnormal; Notable for the following:    APPearance CLOUDY (*)    Hgb urine dipstick LARGE (*)    Ketones, ur 40 (*)    Leukocytes, UA TRACE (*)    All other components within normal limits  URINE MICROSCOPIC-ADD ON - Abnormal; Notable for the following:    Squamous Epithelial / LPF 0-5 (*)    Bacteria, UA RARE (*)    All other components within normal limits  LIPASE, BLOOD  PREGNANCY, URINE  GC/CHLAMYDIA PROBE AMP ([place]) NOT AT [facility]    EKG  EKG Interpretation None       Radiology US Transvaginal Non-ob  Result Date: 11/21/2015 CLINICAL DATA:  Abdominal pain and vaginal bleeding. EXAM: TRANSABDOMINAL AND TRANSVAGINAL ULTRASOUND OF PELVIS DOPPLER ULTRASOUND OF OVARIES TECHNIQUE: Both transabdominal and transvaginal ultrasound examinations of the pelvis were performed. Transabdominal technique was performed for global imaging of the pelvis including uterus, ovaries, adnexal regions, and pelvic cul-de-sac. It was necessary to proceed with endovaginal exam following the transabdominal exam to visualize the ovaries and uterus. Color and duplex Doppler ultrasound was utilized to evaluate blood flow to the ovaries. COMPARISON:  None. FINDINGS: Uterus Measurements: 7.6 x 4.9 x 5.2 cm. No fibroids or other mass visualized. Endometrium Thickness: 4.4 mm. There is a small amount of complex fluid within the distal endometrial cavity which may reflect blood products. Right ovary Measurements: 4.7 x 2.7 x 3.1 cm. Simple appearing cyst within left ovary measures 4 x 1.9 x 2.3 cm. No internal septation or mural nodularity identified. Left ovary Measurements: 3 x 2.2 x 2.5 cm. Normal appearance/no adnexal mass. Pulsed Doppler evaluation of both ovaries demonstrates normal low-resistance arterial and venous waveforms. Other findings No abnormal free fluid. IMPRESSION: 1. No evidence for ovarian torsion. 2. Right ovary cyst measures 4 cm. This is almost certainly benign, and no specific imaging follow up  is recommended according to the Society of Radiologists in Ultrasound2010 Consensus Conference Statement (D Lenis Noon et al. Management of Asymptomatic Ovarian and Other Adnexal Cysts Imaged at [place]: Society of Radiologists in Ultrasound Consensus Conference Statement 2010. Radiology 256 (Sept 2010): 943-954.). 3. Moderate amount of complex fluid within the distal endometrial cavity which may reflect blood products. No focal endometrial abnormality identified. If bleeding remains unresponsive to hormonal or medical therapy, sonohysterogram should be considered for focal lesion work-up. (Ref: Radiological Reasoning: Algorithmic Workup of Abnormal Vaginal Bleeding with Endovaginal Sonography and Sonohysterography. AJR 2008; 213:Y86-57) Electronically Signed   By: Signa Kell M.D.   On: 11/21/2015 20:10   US Pelvis Complete  Result Date: 11/21/2015 CLINICAL DATA:  Abdominal pain and vaginal bleeding. EXAM: TRANSABDOMINAL AND TRANSVAGINAL ULTRASOUND OF PELVIS DOPPLER ULTRASOUND OF OVARIES TECHNIQUE: Both transabdominal and transvaginal ultrasound examinations of the pelvis were performed. Transabdominal technique was performed for global imaging of the pelvis including uterus, ovaries, adnexal regions, and pelvic cul-de-sac. It was necessary to proceed with endovaginal exam following the transabdominal exam to visualize the ovaries and uterus. Color and duplex Doppler ultrasound was utilized to evaluate blood flow to the ovaries. COMPARISON:  None. FINDINGS: Uterus Measurements: 7.6 x 4.9 x 5.2 cm. No fibroids or other mass visualized. Endometrium Thickness: 4.4 mm. There is a small amount of complex fluid within the distal endometrial cavity which may reflect blood products. Right ovary Measurements: 4.7 x 2.7 x 3.1 cm. Simple appearing cyst within left ovary measures 4 x 1.9 x 2.3 cm. No internal septation or mural nodularity identified. Left ovary Measurements: 3 x 2.2 x 2.5 cm. Normal appearance/no adnexal mass.  Pulsed Doppler evaluation of both ovaries demonstrates normal low-resistance arterial and venous waveforms. Other findings No abnormal free fluid. IMPRESSION: 1. No evidence for ovarian torsion. 2. Right ovary cyst measures 4 cm. This is almost certainly benign, and no specific imaging follow up is recommended according to the Society of Radiologists in Ultrasound2010 Consensus Conference Statement (D Lenis Noon et al. Management of Asymptomatic Ovarian and Other Adnexal Cysts Imaged at [place]: Society of Radiologists in Ultrasound Consensus  Conference Statement 2010. Radiology 256 (Sept 2010): 943-954.). 3. Moderate amount of complex fluid within the distal endometrial cavity which may reflect blood products. No focal endometrial abnormality identified. If bleeding remains unresponsive to hormonal or medical therapy, sonohysterogram should be considered for focal lesion work-up. (Ref: Radiological Reasoning: Algorithmic Workup of Abnormal Vaginal Bleeding with Endovaginal Sonography and Sonohysterography. AJR 2008; 161:W96-04; 191:S68-73) Electronically Signed   By: Signa Kellaylor  Stroud M.D.   On: 11/21/2015 20:10   [place] Art/ven Flow Abd Pelv Doppler  Result Date: 11/21/2015 CLINICAL DATA:  Abdominal pain and vaginal bleeding. EXAM: TRANSABDOMINAL AND TRANSVAGINAL ULTRASOUND OF PELVIS DOPPLER ULTRASOUND OF OVARIES TECHNIQUE: Both transabdominal and transvaginal ultrasound examinations of the pelvis were performed. Transabdominal technique was performed for global imaging of the pelvis including uterus, ovaries, adnexal regions, and pelvic cul-de-sac. It was necessary to proceed with endovaginal exam following the transabdominal exam to visualize the ovaries and uterus. Color and duplex Doppler ultrasound was utilized to evaluate blood flow to the ovaries. COMPARISON:  None. FINDINGS: Uterus Measurements: 7.6 x 4.9 x 5.2 cm. No fibroids or other mass visualized. Endometrium Thickness: 4.4 mm. There is a small amount of complex fluid within  the distal endometrial cavity which may reflect blood products. Right ovary Measurements: 4.7 x 2.7 x 3.1 cm. Simple appearing cyst within left ovary measures 4 x 1.9 x 2.3 cm. No internal septation or mural nodularity identified. Left ovary Measurements: 3 x 2.2 x 2.5 cm. Normal appearance/no adnexal mass. Pulsed Doppler evaluation of both ovaries demonstrates normal low-resistance arterial and venous waveforms. Other findings No abnormal free fluid. IMPRESSION: 1. No evidence for ovarian torsion. 2. Right ovary cyst measures 4 cm. This is almost certainly benign, and no specific imaging follow up is recommended according to the Society of Radiologists in Ultrasound2010 Consensus Conference Statement (D Lenis NoonLevine et al. Management of Asymptomatic Ovarian and Other Adnexal Cysts Imaged at [place]: Society of Radiologists in Ultrasound Consensus Conference Statement 2010. Radiology 256 (Sept 2010): 943-954.). 3. Moderate amount of complex fluid within the distal endometrial cavity which may reflect blood products. No focal endometrial abnormality identified. If bleeding remains unresponsive to hormonal or medical therapy, sonohysterogram should be considered for focal lesion work-up. (Ref: Radiological Reasoning: Algorithmic Workup of Abnormal Vaginal Bleeding with Endovaginal Sonography and Sonohysterography. AJR 2008; 540:J81-19; 191:S68-73) Electronically Signed   By: Signa Kellaylor  Stroud M.D.   On: 11/21/2015 20:10    Procedures Procedures (including critical care time)  Medications Ordered in ED Medications  megestrol (MEGACE) tablet 40 mg (40 mg Oral Given 11/21/15 2143)     Initial Impression / Assessment and Plan / ED Course  I have reviewed the triage vital signs and the nursing notes.  Pertinent labs & imaging results that were available during my care of the patient were reviewed by me and considered in my medical decision making (see chart for details).  Clinical Course   20 year old female presents with DUB most  likely due to her birth control. Patient is afebrile, not tachycardic or tachypneic, normotensive, and not hypoxic. CBC remarkable for anemia which appears at baseline when compared to 1 year ago. CMP overall unremarkable. Pelvic exam did show blood from cervix and vaginal vault however it was minimal. Wet prep remarkable for clue cells. UA remarkable for large hgb, 40 ketones, trace leukocytes. Pelvic US remarkable for right ovarian cyst which appears benign. Will give dose of Megace here in ED, rx for Flagyl, and have her follow up with OBGYN for possible removal of Nexplanon.  Final Clinical Impressions(s) / ED Diagnoses   Final diagnoses:  Pelvic pain in female  Vaginal bleeding  Bacterial vaginosis    New Prescriptions Discharge Medication List as of 11/21/2015  9:55 PM       Bethel Born, PA-C 11/21/15 1610    Raeford Razor, MD 11/27/15 [phone]

## 2015-11-21 NOTE — ED Notes (Signed)
Called pharm verification of egg allergy and TDAP

## 2015-11-21 NOTE — ED Notes (Signed)
Pt transported to US

## 2015-11-22 LAB — GC/CHLAMYDIA PROBE AMP (~~LOC~~) NOT AT ARMC
Chlamydia: NEGATIVE
Neisseria Gonorrhea: NEGATIVE

## 2016-01-05 ENCOUNTER — Encounter (HOSPITAL_COMMUNITY): Payer: Self-pay

## 2016-01-05 ENCOUNTER — Emergency Department (HOSPITAL_COMMUNITY)
Admission: EM | Admit: 2016-01-05 | Discharge: 2016-01-05 | Disposition: A | Discharge status: Home / Self Care | Payer: Self-pay | Attending: Emergency Medicine | Admitting: Emergency Medicine

## 2016-01-05 DIAGNOSIS — N760 Acute vaginitis: Secondary | ICD-10-CM | POA: Insufficient documentation

## 2016-01-05 DIAGNOSIS — B9689 Other specified bacterial agents as the cause of diseases classified elsewhere: Secondary | ICD-10-CM

## 2016-01-05 LAB — URINALYSIS, ROUTINE W REFLEX MICROSCOPIC
Bilirubin Urine: NEGATIVE
Glucose, UA: NEGATIVE mg/dL
Hgb urine dipstick: NEGATIVE
Ketones, ur: NEGATIVE mg/dL
Leukocytes, UA: NEGATIVE
Nitrite: NEGATIVE
Protein, ur: NEGATIVE mg/dL
Specific Gravity, Urine: 1.024 (ref 1.005–1.030)
pH: 7.5 (ref 5.0–8.0)

## 2016-01-05 LAB — I-STAT BETA HCG BLOOD, ED (MC, WL, AP ONLY): I-stat hCG, quantitative: <5 m[IU]/mL (ref <5)

## 2016-01-05 LAB — WET PREP, GENITAL
Sperm: NONE SEEN
Trich, Wet Prep: NONE SEEN
Yeast Wet Prep HPF POC: NONE SEEN

## 2016-01-05 MED ORDER — METRONIDAZOLE 500 MG PO TABS: 500.0000 mg | ORAL_TABLET | Freq: Two times a day (BID) | ORAL | 0 refills | Status: DC

## 2016-01-05 NOTE — ED Triage Notes (Signed)
Recently dx with vaginal infection. Symptoms returned last week. Vaginal discharge with odor.  No pain

## 2016-01-05 NOTE — ED Provider Notes (Signed)
[facility]-EMERGENCY DEPT Provider Note   CSN: [id number] Arrival date & time: 01/05/16  1054     History   Chief Complaint Chief Complaint  Patient presents with  . Vaginal Discharge    HPI Anita Robinson is a 20 y.o. female.  HPI Anita Robinson is a 20 y.o. female with PMH significant for anemia, STD, and TOA who presents with 1 week of gradual onset, Intermittent, unchanging white occasionally malodorous vaginal discharge. No associated fever, chills, nausea, vomiting, abdominal pain, her nares symptoms, or vaginal bleeding. She states her LMP was 12/15/2015.  She denies any concerns for STDs. She states she was here at the end of September and found to have an ovarian cyst and bacterial vaginosis. At that time she was tested for STDs, and this was negative. She denies any new sexual partners. She defers repeat testing at this time. No medications prior to arrival. Nothing makes her symptoms better or worse.  Past Medical History:  Diagnosis Date  . Anemia    on Iron  . Complication of anesthesia    has never had any type of anesthesia.  . Low iron     Patient Active Problem List   Diagnosis Date Noted  . TOA (tubo-ovarian abscess) 11/21/2014  . Trichomoniasis 08/08/2013    Past Surgical History:  Procedure Laterality Date  . NO PAST SURGERIES      OB History    Gravida Para Term Preterm AB Living   1 1 1  0 0 1   SAB TAB Ectopic Multiple Live Births   0 0 0 0 1       Home Medications    Prior to Admission medications   Medication Sig Start Date End Date Taking? Authorizing Provider  metroNIDAZOLE (FLAGYL) 500 MG tablet Take 1 tablet (500 mg total) by mouth 2 (two) times daily. 01/05/16   Cheri FowlerKayla Fuquan Wilson, PA-C    Family History Family History  Problem Relation Age of Onset  . Asthma Mother   . Asthma Sister   . Asthma Brother   . Alcohol abuse Neg Hx   . Arthritis Neg Hx   . Birth defects Neg Hx   . Cancer Neg Hx   . COPD Neg Hx   . Depression Neg Hx   .  Diabetes Neg Hx   . Drug abuse Neg Hx   . Early death Neg Hx   . Hearing loss Neg Hx   . Heart disease Neg Hx   . Hyperlipidemia Neg Hx   . Hypertension Neg Hx   . Kidney disease Neg Hx   . Learning disabilities Neg Hx   . Mental illness Neg Hx   . Mental retardation Neg Hx   . Miscarriages / Stillbirths Neg Hx   . Stroke Neg Hx   . Vision loss Neg Hx   . Varicose Veins Neg Hx     Social History Social History  Substance Use Topics  . Smoking status: Never Smoker  . Smokeless tobacco: Never Used  . Alcohol use No     Allergies   Eggs or egg-derived products   Review of Systems Review of Systems All other systems negative unless otherwise stated in HPI   Physical Exam Updated Vital Signs BP 130/73 (BP Location: Right Arm)   Pulse 99   Temp 98 F (36.7 C) (Oral)   Resp 18   LMP 12/15/2015   SpO2 100%   Physical Exam  Constitutional: She is oriented to person, place, and time. She appears  well-developed and well-nourished.  Non-toxic appearance. She does not have a sickly appearance. She does not appear ill.  HENT:  Head: Normocephalic and atraumatic.  Mouth/Throat: Oropharynx is clear and moist.  Eyes: Conjunctivae are normal.  Neck: Normal range of motion. Neck supple.  Cardiovascular: Normal rate and regular rhythm.   Pulmonary/Chest: Effort normal and breath sounds normal. No accessory muscle usage or stridor. No respiratory distress. She has no wheezes. She has no rhonchi. She has no rales.  Abdominal: Soft. Bowel sounds are normal. She exhibits no distension. There is no tenderness. There is no rebound and no guarding.  Genitourinary:  Genitourinary Comments: Chaperone present during exam. No external vaginal lesions or irritation. Cervical os closed. No cervical discharge. No cervical bleeding or friability. No CMT or adnexal tenderness.  Musculoskeletal: Normal range of motion.  Lymphadenopathy:    She has no cervical adenopathy.  Neurological: She is  alert and oriented to person, place, and time.  Speech clear without dysarthria.  Skin: Skin is warm and dry.  Psychiatric: She has a normal mood and affect. Her behavior is normal.     ED Treatments / Results  Labs (all labs ordered are listed, but only abnormal results are displayed) Labs Reviewed  WET PREP, GENITAL - Abnormal; Notable for the following:       Result Value   Clue Cells Wet Prep HPF POC PRESENT (*)    WBC, Wet Prep HPF POC FEW (*)    All other components within normal limits  URINALYSIS, ROUTINE W REFLEX MICROSCOPIC (NOT [facility])  I-STAT BETA HCG BLOOD, ED ([facility], [facility], [facility] ONLY)    EKG  EKG Interpretation None       Radiology No results found.  Procedures Procedures (including critical care time)  Medications Ordered in ED Medications - No data to display   Initial Impression / Assessment and Plan / ED Course  I have reviewed the triage vital signs and the nursing notes.  Pertinent labs & imaging results that were available during my care of the patient were reviewed by me and considered in my medical decision making (see chart for details).  Clinical Course    Patient presents with 1 week of vaginal discharge. No fever or abdominal pain. Vitals reassuring. She was seen here at the end of September and had negative STD screen. She defers repeat testing today. I feel this is reasonable.  Abdomen is soft and benign without rebound, guarding, or rigidity. Cervix appears normal with closed os. No CMT or adnexal tenderness..  Low suspicion for TOA or PID. Do not suspect ovarian torsion. She is not pregnant. UA negative. Wet prep with clue cells. We'll treat with Flagyl. Follow up with gynecology. Return precautions discussed. Stable for discharge.   Final Clinical Impressions(s) / ED Diagnoses   Final diagnoses:  BV (bacterial vaginosis)    New Prescriptions New Prescriptions   METRONIDAZOLE (FLAGYL) 500 MG TABLET    Take 1 tablet (500 mg total) by mouth  2 (two) times daily.     Cheri FowlerKayla Tanara Turvey, PA-C 01/05/16 1256    Lavera Guiseana Duo Liu, MD 01/05/16 2127

## 2016-01-05 NOTE — Discharge Instructions (Signed)
Take Flagyl twice daily for the next 7 days. Do not drink alcohol with taking this medication. Please call the gynecologist to schedule a follow-up appointment. Return to the emergency department for fever, severe abdominal pain, increased vaginal discharge, or any new or concerning symptoms.

## 2016-07-09 ENCOUNTER — Encounter (HOSPITAL_COMMUNITY): Payer: Self-pay | Admitting: Emergency Medicine

## 2016-07-09 ENCOUNTER — Emergency Department (HOSPITAL_COMMUNITY)
Admission: EM | Admit: 2016-07-09 | Discharge: 2016-07-09 | Disposition: A | Discharge status: Home / Self Care | Payer: Self-pay | Attending: Emergency Medicine | Admitting: Emergency Medicine

## 2016-07-09 DIAGNOSIS — N760 Acute vaginitis: Secondary | ICD-10-CM

## 2016-07-09 DIAGNOSIS — B9689 Other specified bacterial agents as the cause of diseases classified elsewhere: Secondary | ICD-10-CM | POA: Insufficient documentation

## 2016-07-09 DIAGNOSIS — A599 Trichomoniasis, unspecified: Secondary | ICD-10-CM

## 2016-07-09 DIAGNOSIS — A5901 Trichomonal vulvovaginitis: Secondary | ICD-10-CM | POA: Insufficient documentation

## 2016-07-09 LAB — URINALYSIS, ROUTINE W REFLEX MICROSCOPIC
Bilirubin Urine: NEGATIVE
Glucose, UA: NEGATIVE mg/dL
Hgb urine dipstick: NEGATIVE
Ketones, ur: NEGATIVE mg/dL
Leukocytes, UA: NEGATIVE
Nitrite: NEGATIVE
Protein, ur: NEGATIVE mg/dL
Specific Gravity, Urine: 1.024 (ref 1.005–1.030)
pH: 8 (ref 5.0–8.0)

## 2016-07-09 LAB — WET PREP, GENITAL
Sperm: NONE SEEN
Yeast Wet Prep HPF POC: NONE SEEN

## 2016-07-09 LAB — POC URINE PREG, ED: Preg Test, Ur: NEGATIVE

## 2016-07-09 MED ORDER — CEFTRIAXONE SODIUM 250 MG IJ SOLR
250.0000 mg | Freq: Once | INTRAMUSCULAR | Status: AC
  Administered 2016-07-09: 250 mg via INTRAMUSCULAR
  Filled 2016-07-09: qty 250

## 2016-07-09 MED ORDER — AZITHROMYCIN 250 MG PO TABS
1000.0000 mg | ORAL_TABLET | Freq: Once | ORAL | Status: AC
  Administered 2016-07-09: 1000 mg via ORAL
  Filled 2016-07-09: qty 4

## 2016-07-09 MED ORDER — METRONIDAZOLE 500 MG PO TABS: 500.0000 mg | ORAL_TABLET | Freq: Two times a day (BID) | ORAL | 0 refills | Status: DC

## 2016-07-09 NOTE — Discharge Instructions (Signed)
You have been treated for gonorrhea and chlamydia in the ED. He will need to complete a 7 day course of Flagyl to cover for bacterial vaginosis and Trichomonas. Do not drink alcohol with this medication. Please inform all sexual partners treated for an STD. Avoid sexual intercourse for 14 days. [facility] of your symptoms. Return if he develops any fever, worsening discharge, pain, nausea, vomiting, urinary symptoms.

## 2016-07-09 NOTE — ED Provider Notes (Signed)
[facility]-EMERGENCY DEPT Provider Note   CSN: [id number] Arrival date & time: 07/09/16  1249     History   Chief Complaint Chief Complaint  Patient presents with  . Vaginal Discharge    HPI Anita Robinson is a 21 y.o. female.  HPI 21 year old African-American female past medical history for anemia presents to the emergency Department today with complaints of vaginal discharge and dyspareunia. The patient states that her discharge started approximately 4 days ago. Describes a thick white foul-smelling discharge. She also complains of mild dyspareunia on and off for the past few days. Denies any current pain. States that she has a history of bacterial vaginosis and this feels very similar. States she is sexually active with one female partner and does not use protection. Has low suspicion for STD. Patient is not taking anything for her symptoms. Denies any fever, chills, abdominal pain, nausea, emesis, urinary symptoms, flank pain, vaginal bleeding. States that she has on the Nexplnanon does have approximately 1-2 periods per month that is normal for her. Past Medical History:  Diagnosis Date  . Anemia    on Iron  . Complication of anesthesia    has never had any type of anesthesia.  . Low iron     Patient Active Problem List   Diagnosis Date Noted  . TOA (tubo-ovarian abscess) 11/21/2014  . Trichomoniasis 08/08/2013    Past Surgical History:  Procedure Laterality Date  . NO PAST SURGERIES      OB History    Gravida Para Term Preterm AB Living   1 1 1  0 0 1   SAB TAB Ectopic Multiple Live Births   0 0 0 0 1       Home Medications    Prior to Admission medications   Medication Sig Start Date End Date Taking? Authorizing Provider  metroNIDAZOLE (FLAGYL) 500 MG tablet Take 1 tablet (500 mg total) by mouth 2 (two) times daily. 07/09/16   Rise MuLeaphart, Moise Friday T, PA-C    Family History Family History  Problem Relation Age of Onset  . Asthma Mother   . Asthma Sister   .  Asthma Brother   . Alcohol abuse Neg Hx   . Arthritis Neg Hx   . Birth defects Neg Hx   . Cancer Neg Hx   . COPD Neg Hx   . Depression Neg Hx   . Diabetes Neg Hx   . Drug abuse Neg Hx   . Early death Neg Hx   . Hearing loss Neg Hx   . Heart disease Neg Hx   . Hyperlipidemia Neg Hx   . Hypertension Neg Hx   . Kidney disease Neg Hx   . Learning disabilities Neg Hx   . Mental illness Neg Hx   . Mental retardation Neg Hx   . Miscarriages / Stillbirths Neg Hx   . Stroke Neg Hx   . Vision loss Neg Hx   . Varicose Veins Neg Hx     Social History Social History  Substance Use Topics  . Smoking status: Never Smoker  . Smokeless tobacco: Never Used  . Alcohol use No     Allergies   Eggs or egg-derived products   Review of Systems Review of Systems  Constitutional: Negative for chills and fever.  HENT: Negative for congestion.   Eyes: Negative for visual disturbance.  Respiratory: Negative for cough and shortness of breath.   Cardiovascular: Negative for chest pain.  Gastrointestinal: Negative for abdominal pain, blood in stool, diarrhea, nausea  and vomiting.  Genitourinary: Positive for dyspareunia and vaginal discharge. Negative for dysuria, flank pain, frequency, hematuria, pelvic pain, urgency and vaginal bleeding.  Musculoskeletal: Negative for back pain.  Skin: Negative.   Neurological: Negative for dizziness, syncope, weakness, light-headedness and headaches.     Physical Exam Updated Vital Signs BP 107/76 (BP Location: Left Arm)   Pulse 89   Temp 98.3 F (36.8 C) (Oral)   Resp 16   Ht 5\' 5"  (1.651 m)   Wt 170 lb (77.1 kg)   SpO2 100%   BMI 28.29 kg/m   Physical Exam  Constitutional: She is oriented to person, place, and time. She appears well-developed and well-nourished. No distress.  HENT:  Head: Normocephalic and atraumatic.  Mouth/Throat: Oropharynx is clear and moist.  Eyes: Conjunctivae are normal. Right eye exhibits no discharge. Left eye  exhibits no discharge. No scleral icterus.  Neck: Normal range of motion. Neck supple. No thyromegaly present.  Cardiovascular: Normal rate, regular rhythm, normal heart sounds and intact distal pulses.   Pulmonary/Chest: Effort normal and breath sounds normal.  Abdominal: Soft. Bowel sounds are normal. She exhibits no distension. There is no tenderness. There is no rebound and no guarding.  No cva tenderness or rebound  Genitourinary: Cervix exhibits no motion tenderness. Right adnexum displays no mass, no tenderness and no fullness. Left adnexum displays no mass, no tenderness and no fullness. No tenderness or bleeding in the vagina. Vaginal discharge found.  Genitourinary Comments: Pelvic exam performed with chaperone. No erythema or external lesions noted. Thin green discharge noted in the vaginal vault that is malodorous. No vaginal bleeding noted. Cervix is without any cervical motion tenderness and no adnexal tenderness.  Musculoskeletal: Normal range of motion.  Lymphadenopathy:    She has no cervical adenopathy.       Right: No inguinal adenopathy present.       Left: No inguinal adenopathy present.  Neurological: She is alert and oriented to person, place, and time.  Skin: Skin is warm and dry. Capillary refill takes less than 2 seconds.  Nursing note and vitals reviewed.    ED Treatments / Results  Labs (all labs ordered are listed, but only abnormal results are displayed) Labs Reviewed  WET PREP, GENITAL - Abnormal; Notable for the following:       Result Value   Trich, Wet Prep PRESENT (*)    Clue Cells Wet Prep HPF POC PRESENT (*)    WBC, Wet Prep HPF POC FEW (*)    All other components within normal limits  URINALYSIS, ROUTINE W REFLEX MICROSCOPIC  POC URINE PREG, ED  GC/CHLAMYDIA PROBE AMP ([place]) NOT AT [facility]    EKG  EKG Interpretation None       Radiology No results found.  Procedures Procedures (including critical care time)  Medications Ordered  in ED Medications  cefTRIAXone (ROCEPHIN) injection 250 mg (not administered)  azithromycin (ZITHROMAX) tablet 1,000 mg (not administered)     Initial Impression / Assessment and Plan / ED Course  I have reviewed the triage vital signs and the nursing notes.  Pertinent labs & imaging results that were available during my care of the patient were reviewed by me and considered in my medical decision making (see chart for details).     Patient presents to the emergency Department with complaints of vaginal discharge and dyspareunia. Patient states she has a history of bacterial vaginosis and this feels similar. Pelvic exam is relatively benign except for vaginal discharge noted. Wet  prep reveals Trichomonas and clue cells consistent with bacterial vaginosis. GC and chlamydia cultures are pending. The patient does not want HIV or syphilis testing. Will treat patient for gonorrhea and chlamydia pending cultures. We'll send patient home on Flagyl for 7 days ago for bacterial vaginosis and Trichomonas. No cervical motion tenderness or adnexal tenderness concerning for PID, ovarian abscess, ovarian torsion, ovarian cyst. Pregnant test is negative. Urine shows no signs of infection. Patient will need close follow-up with OB/GYN has been given referral. She is nontoxic-appearing. She is afebrile and not tachycardic.  Has no systemic symptoms including fever, nausea, vomiting, abdominal pain. Patient given strict return precautions. Encouraged to inform all sexual partners to be treated and to avoid sexual contact for 14 days. Patient verbalized understanding the plan of care and all questions were answered prior to discharge.  Final Clinical Impressions(s) / ED Diagnoses   Final diagnoses:  BV (bacterial vaginosis)  Trichomonas infection    New Prescriptions Current Discharge Medication List       Wallace Keller 07/09/16 1736    Rise Mu, PA-C 07/09/16 1739    Lorre Nick, MD 07/09/16 2350

## 2016-07-09 NOTE — ED Triage Notes (Signed)
Patient reports thick white vaginal discharge for past four days and pain during intercourse. Denies any urinary symptoms. Pt admits to one partner with unprotected sex.

## 2016-07-10 LAB — GC/CHLAMYDIA PROBE AMP (~~LOC~~) NOT AT ARMC
Chlamydia: NEGATIVE
Neisseria Gonorrhea: NEGATIVE

## 2016-10-28 ENCOUNTER — Emergency Department (HOSPITAL_COMMUNITY)
Admission: EM | Admit: 2016-10-28 | Discharge: 2016-10-28 | Disposition: A | Discharge status: Home / Self Care | Payer: Self-pay | Attending: Emergency Medicine | Admitting: Emergency Medicine

## 2016-10-28 ENCOUNTER — Encounter (HOSPITAL_COMMUNITY): Payer: Self-pay | Admitting: Emergency Medicine

## 2016-10-28 DIAGNOSIS — B9689 Other specified bacterial agents as the cause of diseases classified elsewhere: Secondary | ICD-10-CM | POA: Insufficient documentation

## 2016-10-28 DIAGNOSIS — N760 Acute vaginitis: Secondary | ICD-10-CM | POA: Insufficient documentation

## 2016-10-28 LAB — URINALYSIS, COMPLETE (UACMP) WITH MICROSCOPIC
Bacteria, UA: NONE SEEN
Bilirubin Urine: NEGATIVE
Glucose, UA: NEGATIVE mg/dL
Ketones, ur: NEGATIVE mg/dL
Nitrite: NEGATIVE
Protein, ur: NEGATIVE mg/dL
Specific Gravity, Urine: 1.02 (ref 1.005–1.030)
pH: 6 (ref 5.0–8.0)

## 2016-10-28 LAB — RAPID HIV SCREEN (HIV 1/2 AB+AG)
HIV 1/2 Antibodies: NONREACTIVE
HIV-1 P24 Antigen - HIV24: NONREACTIVE

## 2016-10-28 LAB — WET PREP, GENITAL
Sperm: NONE SEEN
Trich, Wet Prep: NONE SEEN
Yeast Wet Prep HPF POC: NONE SEEN

## 2016-10-28 LAB — PREGNANCY, URINE: Preg Test, Ur: NEGATIVE

## 2016-10-28 MED ORDER — METRONIDAZOLE 500 MG PO TABS: 500.0000 mg | ORAL_TABLET | Freq: Two times a day (BID) | ORAL | 0 refills | Status: DC

## 2016-10-28 NOTE — ED Provider Notes (Signed)
[facility]-EMERGENCY DEPT Provider Note   CSN: [id number] Arrival date & time: 10/28/16  [id number]     History   Chief Complaint Chief Complaint  Patient presents with  . Vaginal Discharge    HPI Anita Robinson is a 21 y.o. female.  HPI  21 year old female with vaginal discharge. Onset several days ago. Was a and odorous. Painful intercourse. No unusual bleeding. No urinary complaints. No pain aside from when having intercourse.   Past Medical History:  Diagnosis Date  . Anemia    on Iron  . Complication of anesthesia    has never had any type of anesthesia.  . Low iron     Patient Active Problem List   Diagnosis Date Noted  . TOA (tubo-ovarian abscess) 11/21/2014  . Trichomoniasis 08/08/2013    Past Surgical History:  Procedure Laterality Date  . NO PAST SURGERIES      OB History    Gravida Para Term Preterm AB Living   1 1 1  0 0 1   SAB TAB Ectopic Multiple Live Births   0 0 0 0 1       Home Medications    Prior to Admission medications   Medication Sig Start Date End Date Taking? Authorizing Provider  metroNIDAZOLE (FLAGYL) 500 MG tablet Take 1 tablet (500 mg total) by mouth 2 (two) times daily. 07/09/16   Rise MuLeaphart, Kenneth T, PA-C    Family History Family History  Problem Relation Age of Onset  . Asthma Mother   . Asthma Sister   . Asthma Brother   . Alcohol abuse Neg Hx   . Arthritis Neg Hx   . Birth defects Neg Hx   . Cancer Neg Hx   . COPD Neg Hx   . Depression Neg Hx   . Diabetes Neg Hx   . Drug abuse Neg Hx   . Early death Neg Hx   . Hearing loss Neg Hx   . Heart disease Neg Hx   . Hyperlipidemia Neg Hx   . Hypertension Neg Hx   . Kidney disease Neg Hx   . Learning disabilities Neg Hx   . Mental illness Neg Hx   . Mental retardation Neg Hx   . Miscarriages / Stillbirths Neg Hx   . Stroke Neg Hx   . Vision loss Neg Hx   . Varicose Veins Neg Hx     Social History Social History  Substance Use Topics  . Smoking status: Never Smoker  .  Smokeless tobacco: Never Used  . Alcohol use No     Allergies   Eggs or egg-derived products   Review of Systems Review of Systems  All systems reviewed and negative, other than as noted in HPI.  Physical Exam Updated Vital Signs BP 113/80   Pulse 99   Temp 98.2 F (36.8 C) (Oral)   Resp 18   Ht 5\' 5"  (1.651 m)   Wt 77.1 kg (170 lb)   LMP 10/28/2016   SpO2 100%   BMI 28.29 kg/m   Physical Exam  Constitutional: She appears well-developed and well-nourished. No distress.  HENT:  Head: Normocephalic and atraumatic.  Eyes: Conjunctivae are normal. Right eye exhibits no discharge. Left eye exhibits no discharge.  Neck: Neck supple.  Cardiovascular: Normal rate, regular rhythm and normal heart sounds.  Exam reveals no gallop and no friction rub.   No murmur heard. Pulmonary/Chest: Effort normal and breath sounds normal. No respiratory distress.  Abdominal: Soft. She exhibits no distension. There is  no tenderness.  Genitourinary:  Genitourinary Comments: Chaperone present. Normal external genitalia. Thin pancreas vaginal discharge. Normal-appearing cervix.  Musculoskeletal: She exhibits no edema or tenderness.  Neurological: She is alert.  Skin: Skin is warm and dry.  Psychiatric: She has a normal mood and affect. Her behavior is normal. Thought content normal.  Nursing note and vitals reviewed.    ED Treatments / Results  Labs (all labs ordered are listed, but only abnormal results are displayed) Labs Reviewed  WET PREP, GENITAL - Abnormal; Notable for the following:       Result Value   Clue Cells Wet Prep HPF POC PRESENT (*)    WBC, Wet Prep HPF POC MODERATE (*)    All other components within normal limits  URINALYSIS, COMPLETE (UACMP) WITH MICROSCOPIC - Abnormal; Notable for the following:    APPearance HAZY (*)    Hgb urine dipstick MODERATE (*)    Leukocytes, UA TRACE (*)    Squamous Epithelial / LPF 6-30 (*)    All other components within normal limits    PREGNANCY, URINE  RAPID HIV SCREEN (HIV 1/2 AB+AG)  RPR  GC/CHLAMYDIA PROBE AMP ([place]) NOT AT [facility]    EKG  EKG Interpretation None       Radiology No results found.  Procedures Procedures (including critical care time)  Medications Ordered in ED Medications - No data to display   Initial Impression / Assessment and Plan / ED Course  I have reviewed the triage vital signs and the nursing notes.  Pertinent labs & imaging results that were available during my care of the patient were reviewed by me and considered in my medical decision making (see chart for details).     21 year old female with vaginal discharge and dyspareunia. Likely from bacterial vaginosis. Flagyl.  Final Clinical Impressions(s) / ED Diagnoses   Final diagnoses:  BV (bacterial vaginosis)    New Prescriptions New Prescriptions   No medications on file     Raeford Razor, MD 11/15/16 [phone]

## 2016-10-28 NOTE — ED Triage Notes (Signed)
Pt reports vaginal discharge, thick white foul odor. Painful intercourse. denies dysuria nor hematuria.

## 2016-10-29 LAB — GC/CHLAMYDIA PROBE AMP (~~LOC~~) NOT AT ARMC
Chlamydia: NEGATIVE
Neisseria Gonorrhea: NEGATIVE

## 2016-10-29 LAB — RPR: RPR Ser Ql: NONREACTIVE

## 2017-01-17 ENCOUNTER — Emergency Department (HOSPITAL_COMMUNITY)
Admission: EM | Admit: 2017-01-17 | Discharge: 2017-01-17 | Disposition: A | Discharge status: Home / Self Care | Payer: Self-pay | Attending: Emergency Medicine | Admitting: Emergency Medicine

## 2017-01-17 ENCOUNTER — Encounter (HOSPITAL_COMMUNITY): Payer: Self-pay | Admitting: Emergency Medicine

## 2017-01-17 ENCOUNTER — Emergency Department (HOSPITAL_COMMUNITY): Payer: Self-pay

## 2017-01-17 DIAGNOSIS — H109 Unspecified conjunctivitis: Secondary | ICD-10-CM

## 2017-01-17 DIAGNOSIS — Z975 Presence of (intrauterine) contraceptive device: Secondary | ICD-10-CM | POA: Insufficient documentation

## 2017-01-17 DIAGNOSIS — H1031 Unspecified acute conjunctivitis, right eye: Secondary | ICD-10-CM | POA: Insufficient documentation

## 2017-01-17 LAB — BASIC METABOLIC PANEL
Anion gap: 7 (ref 5–15)
BUN: 8 mg/dL (ref 6–20)
CO2: 28 mmol/L (ref 22–32)
Calcium: 9.3 mg/dL (ref 8.9–10.3)
Chloride: 105 mmol/L (ref 101–111)
Creatinine, Ser: 0.73 mg/dL (ref 0.44–1.00)
GFR calc Af Amer: >60 mL/min (ref >60)
GFR calc non Af Amer: >60 mL/min (ref >60)
Glucose, Bld: 99 mg/dL (ref 65–99)
Potassium: 3.3 mmol/L — ABNORMAL LOW (ref 3.5–5.1)
Sodium: 140 mmol/L (ref 135–145)

## 2017-01-17 LAB — CBC WITH DIFFERENTIAL/PLATELET
Basophils Absolute: 0 10*3/uL (ref 0.0–0.1)
Basophils Relative: 0 %
Eosinophils Absolute: 0 10*3/uL (ref 0.0–0.7)
Eosinophils Relative: 0 %
HCT: 38.4 % (ref 36.0–46.0)
Hemoglobin: 12 g/dL (ref 12.0–15.0)
Lymphocytes Relative: 40 %
Lymphs Abs: 1.8 10*3/uL (ref 0.7–4.0)
MCH: 23.6 pg — ABNORMAL LOW (ref 26.0–34.0)
MCHC: 31.3 g/dL (ref 30.0–36.0)
MCV: 75.6 fL — ABNORMAL LOW (ref 78.0–100.0)
Monocytes Absolute: 0.4 10*3/uL (ref 0.1–1.0)
Monocytes Relative: 10 %
Neutro Abs: 2.2 10*3/uL (ref 1.7–7.7)
Neutrophils Relative %: 50 %
Platelets: 288 10*3/uL (ref 150–400)
RBC: 5.08 MIL/uL (ref 3.87–5.11)
RDW: 13.9 % (ref 11.5–15.5)
WBC: 4.5 10*3/uL (ref 4.0–10.5)

## 2017-01-17 LAB — I-STAT BETA HCG BLOOD, ED (MC, WL, AP ONLY): I-stat hCG, quantitative: <5 m[IU]/mL (ref <5)

## 2017-01-17 MED ORDER — SULFACETAMIDE SODIUM 10 % OP SOLN: 1.0000 [drp] | OPHTHALMIC | 0 refills | Status: DC

## 2017-01-17 MED ORDER — TETRACAINE HCL 0.5 % OP SOLN
1.0000 [drp] | Freq: Once | OPHTHALMIC | Status: AC
  Administered 2017-01-17: 1 [drp] via OPHTHALMIC
  Filled 2017-01-17: qty 4

## 2017-01-17 MED ORDER — IOPAMIDOL (ISOVUE-300) INJECTION 61%
80.0000 mL | Freq: Once | INTRAVENOUS | Status: AC | PRN
  Administered 2017-01-17: 80 mL via INTRAVENOUS

## 2017-01-17 MED ORDER — FLUORESCEIN SODIUM 1 MG OP STRP
1.0000 | ORAL_STRIP | Freq: Once | OPHTHALMIC | Status: AC
  Administered 2017-01-17: 1 via OPHTHALMIC
  Filled 2017-01-17: qty 1

## 2017-01-17 NOTE — Discharge Instructions (Signed)
Please read the attached information regarding your condition. Apply eyedrops to affected area and apply warm compresses to help drainage. Follow-up with eye doctor for further evaluation. Return to ED for worsening pain, injuries around eye, fever, vision changes.

## 2017-01-17 NOTE — ED Triage Notes (Signed)
Patient c/o right eye swelling and drainage onset of last Wednesday. Pt reports using warm rags for pain. C/o that eye is not getting any better.

## 2017-01-17 NOTE — ED Provider Notes (Signed)
[facility]-EMERGENCY DEPT Provider Note   CSN: [id number] Arrival date & time: 01/17/17  1623     History   Chief Complaint Chief Complaint  Patient presents with  . Eye Drainage    HPI [name] is a 21 y.o. female with no significant past medical history, who presents to ED for evaluation of right eye redness, pain with eye movements, drainage and swelling for the past 5 days.  Reports clear and yellow drainage from eye.  She also reports intermittent foreign body sensation.  She reports swelling in her upper eyelid and has tried warm rags to help.  She has not tried any over-the-counter medications to help with symptoms.  She denies any injury, trauma to area, URI symptoms, fevers, headache or vision changes.  HPI  Past Medical History:  Diagnosis Date  . Anemia    on Iron  . Complication of anesthesia    has never had any type of anesthesia.  . Low iron     Patient Active Problem List   Diagnosis Date Noted  . TOA (tubo-ovarian abscess) 11/21/2014  . Trichomoniasis 08/08/2013    Past Surgical History:  Procedure Laterality Date  . NO PAST SURGERIES      OB History    Gravida Para Term Preterm AB Living   1 1 1  0 0 1   SAB TAB Ectopic Multiple Live Births   0 0 0 0 1       Home Medications    Prior to Admission medications   Medication Sig Start Date End Date Taking? Authorizing Provider  etonogestrel (NEXPLANON) 68 MG IMPL implant 1 each by Subdermal route once.   Yes [provider]  tetrahydrozoline-zinc (VISINE-AC) 0.05-0.25 % ophthalmic solution Place 2 drops into both eyes 3 (three) times daily as needed (irritation and redness).   Yes [provider]  metroNIDAZOLE (FLAGYL) 500 MG tablet Take 1 tablet (500 mg total) by mouth 2 (two) times daily. Patient not taking: Reported on 01/17/2017 10/28/16   Raeford RazorKohut, Stephen, MD  sulfacetamide (BLEPH-10) 10 % ophthalmic solution Place 1-2 drops into the right eye every  4 (four) hours. 01/17/17   Dietrich PatesKhatri, Raylin Diguglielmo, PA-C    Family History Family History  Problem Relation Age of Onset  . Asthma Mother   . Asthma Sister   . Asthma Brother   . Alcohol abuse Neg Hx   . Arthritis Neg Hx   . Birth defects Neg Hx   . Cancer Neg Hx   . COPD Neg Hx   . Depression Neg Hx   . Diabetes Neg Hx   . Drug abuse Neg Hx   . Early death Neg Hx   . Hearing loss Neg Hx   . Heart disease Neg Hx   . Hyperlipidemia Neg Hx   . Hypertension Neg Hx   . Kidney disease Neg Hx   . Learning disabilities Neg Hx   . Mental illness Neg Hx   . Mental retardation Neg Hx   . Miscarriages / Stillbirths Neg Hx   . Stroke Neg Hx   . Vision loss Neg Hx   . Varicose Veins Neg Hx     Social History Social History   Tobacco Use  . Smoking status: Never Smoker  . Smokeless tobacco: Never Used  Substance Use Topics  . Alcohol use: No  . Drug use: No     Allergies   Eggs or egg-derived products   Review of Systems Review of Systems  Constitutional: Negative for chills, fatigue and fever.  HENT: Negative for congestion, facial swelling and rhinorrhea.   Eyes: Positive for discharge, redness and itching. Negative for photophobia, pain and visual disturbance.  Gastrointestinal: Negative for nausea and vomiting.     Physical Exam Updated Vital Signs BP 115/83 (BP Location: Right Arm)   Pulse (!) 110   Temp 100 F (37.8 C) (Oral)   Resp 18   LMP 01/17/2017   SpO2 100%   Physical Exam  Constitutional: She appears well-developed and well-nourished. No distress.  HENT:  Head: Normocephalic and atraumatic.  Eyes: EOM are normal. Pupils are equal, round, and reactive to light. Right eye exhibits discharge. Right conjunctiva is injected. No scleral icterus.  Right eye with injected conjunctiva with upper eyelid edema noted.  Erythema or tenderness to palpation.  Mild clear tearful drainage noted.  No foreign bodies noted.  No chemosis, proptosis, or consensual  photophobia.  Fluorescein stain with no corneal abrasions, foreign bodies, dendritic lesions, ulcerations, negative Sidel sign.  Neck: Normal range of motion.  Pulmonary/Chest: Effort normal. No respiratory distress.  Neurological: She is alert.  Skin: No rash noted. She is not diaphoretic.  Psychiatric: She has a normal mood and affect.  Nursing note and vitals reviewed.    ED Treatments / Results  Labs (all labs ordered are listed, but only abnormal results are displayed) Labs Reviewed  BASIC METABOLIC PANEL - Abnormal; Notable for the following components:      Result Value   Potassium 3.3 (*)    All other components within normal limits  CBC WITH DIFFERENTIAL/PLATELET - Abnormal; Notable for the following components:   MCV 75.6 (*)    MCH 23.6 (*)    All other components within normal limits  I-STAT BETA HCG BLOOD, ED ([facility], [facility], [facility] ONLY)    EKG  EKG Interpretation None       Radiology Ct Orbits W Contrast  Result Date: 01/17/2017 CLINICAL DATA:  21 y/o F; right eye swelling and drainage with onset last Wednesday. EXAM: CT ORBITS WITH CONTRAST TECHNIQUE: Multidetector CT images was performed according to the standard protocol following intravenous contrast administration. CONTRAST:  80mL ISOVUE-300 IOPAMIDOL (ISOVUE-300) INJECTION 61% COMPARISON:  None. FINDINGS: Orbits: No traumatic or inflammatory finding. Globes, optic nerves, orbital fat, extraocular muscles, vascular structures, and lacrimal glands are normal. Mild increased asymmetric enhancement of right conjunctivae in comparison with the left. Visualized sinuses: Clear. Soft tissues: Negative. Limited intracranial: No significant or unexpected finding. IMPRESSION: Mild increased asymmetric enhancement of right conjunctivae in comparison with the left probably representing infection or inflammation. No abscess identified. No orbital infectious or inflammatory change. Electronically Signed   By: Mitzi Hansen  M.D.   On: 01/17/2017 20:32    Procedures Procedures (including critical care time)  Medications Ordered in ED Medications  fluorescein ophthalmic strip 1 strip (1 strip Right Eye Given 01/17/17 1804)  tetracaine (PONTOCAINE) 0.5 % ophthalmic solution 1 drop (1 drop Right Eye Given 01/17/17 1805)  iopamidol (ISOVUE-300) 61 % injection 80 mL (80 mLs Intravenous Contrast Given 01/17/17 2003)     Initial Impression / Assessment and Plan / ED Course  I have reviewed the triage vital signs and the nursing notes.  Pertinent labs & imaging results that were available during my care of the patient were reviewed by me and considered in my medical decision making (see chart for details).     Patient presents to ED for evaluation of right eye pain, swelling, redness and drainage  for the past 5 days.  She reports clear and yellow drainage from eye.  As well as intermittent foreign body sensation.  She does report associated blurry vision.  On physical exam there is edema of the upper eyelid and redness of the eye.  Her pupils are equal and reactive.  There is tearing and drainage noted.  She does have a temperature of 100 Fahrenheit here in the ED.  Because of her pain with EOMs, swelling and redness I was concerned for orbital and periorbital cellulitis.  Her lab work was unremarkable.  There are no corneal abrasions, ulcerations on her fluorescein stain exam.  CT of the orbits return as significant for conjunctivitis.  Will give patient sulfacetamide drops and encourage warm rags as well as Tylenol ibuprofen as needed for pain.  Patient appears stable for discharge at this time.  Advised her to follow-up with ophthalmologist for further evaluation.  Strict return precautions given.  Final Clinical Impressions(s) / ED Diagnoses   Final diagnoses:  Bacterial conjunctivitis of right eye    ED Discharge Orders        Ordered    sulfacetamide (BLEPH-10) 10 % ophthalmic solution  Every 4 hours      01/17/17 2039       Dietrich PatesKhatri, Afsa Meany, PA-C 01/17/17 2042    Lorre NickAllen, Anthony, MD 01/20/17 [phone]

## 2017-04-21 ENCOUNTER — Ambulatory Visit (HOSPITAL_COMMUNITY)
Admission: EM | Admit: 2017-04-21 | Discharge: 2017-04-21 | Disposition: A | Discharge status: Home / Self Care | Payer: Self-pay | Attending: Family Medicine | Admitting: Family Medicine

## 2017-04-21 ENCOUNTER — Encounter (HOSPITAL_COMMUNITY): Payer: Self-pay | Admitting: Emergency Medicine

## 2017-04-21 DIAGNOSIS — K0889 Other specified disorders of teeth and supporting structures: Secondary | ICD-10-CM

## 2017-04-21 MED ORDER — HYDROCODONE-ACETAMINOPHEN 5-325 MG PO TABS: 1.0000 | ORAL_TABLET | Freq: Four times a day (QID) | ORAL | 0 refills | Status: AC | PRN

## 2017-04-21 MED ORDER — AMOXICILLIN-POT CLAVULANATE 875-125 MG PO TABS: 1.0000 | ORAL_TABLET | Freq: Two times a day (BID) | ORAL | 0 refills | Status: AC

## 2017-04-21 NOTE — Discharge Instructions (Signed)

## 2017-04-21 NOTE — ED Triage Notes (Signed)
PT has right lower jaw pain and swelling for 2 days.

## 2017-04-21 NOTE — ED Provider Notes (Signed)
[facility]    CSN: [id number] Arrival date & time: 04/21/17  1230     History   Chief Complaint Chief Complaint  Patient presents with  . Dental Pain    HPI [name] is a 22 y.o. female no significant past medical history presenting today with dental pain.  States that she has had a broken tooth on her right lower jaw and recently has increased in pain and swelling.  She is working on getting her Medicaid set up to have insurance to have dental work.  Denies trouble swallowing, still able to eat and drink.  Denies fevers.  HPI  Past Medical History:  Diagnosis Date  . Anemia    on Iron  . Complication of anesthesia    has never had any type of anesthesia.  . Low iron     Patient Active Problem List   Diagnosis Date Noted  . TOA (tubo-ovarian abscess) 11/21/2014  . Trichomoniasis 08/08/2013    Past Surgical History:  Procedure Laterality Date  . NO PAST SURGERIES      OB History    Gravida Para Term Preterm AB Living   1 1 1  0 0 1   SAB TAB Ectopic Multiple Live Births   0 0 0 0 1       Home Medications    Prior to Admission medications   Medication Sig Start Date End Date Taking? Authorizing Provider  amoxicillin-clavulanate (AUGMENTIN) 875-125 MG tablet Take 1 tablet by mouth every 12 (twelve) hours for 5 days. 04/21/17 04/26/17  Wieters, Hallie C, PA-C  etonogestrel (NEXPLANON) 68 MG IMPL implant 1 each by Subdermal route once.    [provider]  HYDROcodone-acetaminophen (NORCO/VICODIN) 5-325 MG tablet Take 1-2 tablets by mouth every 6 (six) hours as needed for up to 3 days. 04/21/17 04/24/17  Wieters, Hallie C, PA-C  medroxyPROGESTERone (DEPO-PROVERA) 150 MG/ML injection Inject 150 mg into the muscle every 3 (three) months.    [provider]  metroNIDAZOLE (FLAGYL) 500 MG tablet Take 1 tablet (500 mg total) by mouth 2 (two) times daily. Patient not taking: Reported on 01/17/2017 10/28/16   Raeford RazorKohut, Stephen, MD  sulfacetamide  (BLEPH-10) 10 % ophthalmic solution Place 1-2 drops into the right eye every 4 (four) hours. 01/17/17   Khatri, Hina, PA-C  tetrahydrozoline-zinc (VISINE-AC) 0.05-0.25 % ophthalmic solution Place 2 drops into both eyes 3 (three) times daily as needed (irritation and redness).    [provider]    Family History Family History  Problem Relation Age of Onset  . Asthma Mother   . Asthma Sister   . Asthma Brother   . Alcohol abuse Neg Hx   . Arthritis Neg Hx   . Birth defects Neg Hx   . Cancer Neg Hx   . COPD Neg Hx   . Depression Neg Hx   . Diabetes Neg Hx   . Drug abuse Neg Hx   . Early death Neg Hx   . Hearing loss Neg Hx   . Heart disease Neg Hx   . Hyperlipidemia Neg Hx   . Hypertension Neg Hx   . Kidney disease Neg Hx   . Learning disabilities Neg Hx   . Mental illness Neg Hx   . Mental retardation Neg Hx   . Miscarriages / Stillbirths Neg Hx   . Stroke Neg Hx   . Vision loss Neg Hx   . Varicose Veins Neg Hx     Social History Social History  Tobacco Use  . Smoking status: Never Smoker  . Smokeless tobacco: Never Used  Substance Use Topics  . Alcohol use: No  . Drug use: No     Allergies   Eggs or egg-derived products   Review of Systems Review of Systems  Constitutional: Negative for fatigue and fever.  HENT: Positive for dental problem.   Respiratory: Negative for shortness of breath.   Cardiovascular: Negative for chest pain.  Gastrointestinal: Negative for nausea and vomiting.  Musculoskeletal: Negative for myalgias.  Neurological: Negative for dizziness, weakness, light-headedness and headaches.     Physical Exam Triage Vital Signs ED Triage Vitals  Enc Vitals Group     BP 04/21/17 1411 119/82     Pulse Rate 04/21/17 1411 80     Resp 04/21/17 1411 16     Temp 04/21/17 1411 99.2 F (37.3 C)     Temp Source 04/21/17 1411 Oral     SpO2 04/21/17 1411 100 %     Weight 04/21/17 1410 170 lb (77.1 kg)     Height --      Head  Circumference --      Peak Flow --      Pain Score 04/21/17 1410 9     Pain Loc --      Pain Edu? --      Excl. in GC? --    No data found.  Updated Vital Signs BP 119/82   Pulse 80   Temp 99.2 F (37.3 C) (Oral)   Resp 16   Wt 170 lb (77.1 kg)   SpO2 100%   BMI 28.29 kg/m   Visual Acuity Right Eye Distance:   Left Eye Distance:   Bilateral Distance:    Right Eye Near:   Left Eye Near:    Bilateral Near:     Physical Exam  Constitutional: She appears well-developed and well-nourished. No distress.  HENT:  Head: Normocephalic and atraumatic.  Poor dentition, decayed and broken right lower molar.  Mild gingival swelling surrounding, no facial asymmetry due to swelling.  Oropharynx clear  Eyes: Conjunctivae are normal.  Neck: Neck supple.  Cardiovascular: Normal rate and regular rhythm.  No murmur heard. Pulmonary/Chest: Effort normal and breath sounds normal. No respiratory distress.  Breathing comfortably at rest  Abdominal: Soft. There is no tenderness.  Musculoskeletal: She exhibits no edema.  Neurological: She is alert.  Skin: Skin is warm and dry.  Psychiatric: She has a normal mood and affect.  Nursing note and vitals reviewed.    UC Treatments / Results  Labs (all labs ordered are listed, but only abnormal results are displayed) Labs Reviewed - No data to display  EKG  EKG Interpretation None       Radiology No results found.  Procedures Procedures (including critical care time)  Medications Ordered in UC Medications - No data to display   Initial Impression / Assessment and Plan / UC Course  I have reviewed the triage vital signs and the nursing notes.  Pertinent labs & imaging results that were available during my care of the patient were reviewed by me and considered in my medical decision making (see chart for details).     Will provide Augmentin for infection, few days of Norco.  Advised Tylenol and ibuprofen for mild to  moderate pain.  Dental resources provided. Discussed strict return precautions. Patient verbalized understanding and is agreeable with plan.   Final Clinical Impressions(s) / UC Diagnoses   Final diagnoses:  Pain, dental  ED Discharge Orders        Ordered    HYDROcodone-acetaminophen (NORCO/VICODIN) 5-325 MG tablet  Every 6 hours PRN     04/21/17 1451    amoxicillin-clavulanate (AUGMENTIN) 875-125 MG tablet  Every 12 hours    Comments:  Generic okay   04/21/17 1452       Controlled Substance Prescriptions [place] Controlled Substance Registry consulted? Yes, I have consulted the [place] Controlled Substances Registry for this patient, and feel the risk/benefit ratio today is favorable for proceeding with this prescription for a controlled substance.   Anita Robinson [place] C, PA-C 04/21/17 1525

## 2018-02-04 ENCOUNTER — Emergency Department (HOSPITAL_COMMUNITY)
Admission: EM | Admit: 2018-02-04 | Discharge: 2018-02-05 | Disposition: A | Discharge status: Home / Self Care | Payer: Medicaid Other | Attending: Emergency Medicine | Admitting: Emergency Medicine

## 2018-02-04 ENCOUNTER — Emergency Department (HOSPITAL_COMMUNITY): Payer: Medicaid Other

## 2018-02-04 DIAGNOSIS — R112 Nausea with vomiting, unspecified: Secondary | ICD-10-CM | POA: Diagnosis present

## 2018-02-04 LAB — CBC
HCT: 40.8 % (ref 36.0–46.0)
Hemoglobin: 11.7 g/dL — ABNORMAL LOW (ref 12.0–15.0)
MCH: 22.2 pg — ABNORMAL LOW (ref 26.0–34.0)
MCHC: 28.7 g/dL — ABNORMAL LOW (ref 30.0–36.0)
MCV: 77.3 fL — ABNORMAL LOW (ref 80.0–100.0)
Platelets: 315 10*3/uL (ref 150–400)
RBC: 5.28 MIL/uL — ABNORMAL HIGH (ref 3.87–5.11)
RDW: 13.6 % (ref 11.5–15.5)
WBC: 8 10*3/uL (ref 4.0–10.5)
nRBC: 0 % (ref 0.0–0.2)

## 2018-02-04 LAB — BASIC METABOLIC PANEL
Anion gap: 13 (ref 5–15)
BUN: 9 mg/dL (ref 6–20)
CO2: 21 mmol/L — ABNORMAL LOW (ref 22–32)
Calcium: 9.5 mg/dL (ref 8.9–10.3)
Chloride: 105 mmol/L (ref 98–111)
Creatinine, Ser: 0.97 mg/dL (ref 0.44–1.00)
GFR calc Af Amer: >60 mL/min (ref >60)
GFR calc non Af Amer: >60 mL/min (ref >60)
Glucose, Bld: 116 mg/dL — ABNORMAL HIGH (ref 70–99)
Potassium: 3.3 mmol/L — ABNORMAL LOW (ref 3.5–5.1)
Sodium: 139 mmol/L (ref 135–145)

## 2018-02-04 LAB — I-STAT BETA HCG BLOOD, ED (MC, WL, AP ONLY): I-stat hCG, quantitative: <5 m[IU]/mL (ref <5)

## 2018-02-04 LAB — I-STAT TROPONIN, ED: Troponin i, poc: 0 ng/mL (ref 0.00–0.08)

## 2018-02-04 LAB — LIPASE, BLOOD: Lipase: 27 U/L (ref 11–51)

## 2018-02-04 LAB — I-STAT CG4 LACTIC ACID, ED: Lactic Acid, Venous: 1.53 mmol/L (ref 0.5–1.9)

## 2018-02-04 NOTE — ED Triage Notes (Signed)
Pt complaining of sharp abdominal pain with episodes of nausea and vomiting. Started about 6 hours ago.

## 2018-02-04 NOTE — ED Provider Notes (Signed)
Emergency Department Provider Note   I have reviewed the triage vital signs and the nursing notes.   HISTORY  Chief Complaint No chief complaint on file.   HPI Anita Robinson is a 22 y.o. female with PMH as documented below who presents to the ER secondary to vomiting and abdominal pain both of which seem to be improving. No urinary symptoms. No cough/fever. No rashes. No diarrhea. No sick contacts or associated suspicious food.   No other associated or modifying symptoms.    Past Medical History:  Diagnosis Date  . Anemia    on Iron  . Complication of anesthesia    has never had any type of anesthesia.  . Low iron     Patient Active Problem List   Diagnosis Date Noted  . TOA (tubo-ovarian abscess) 11/21/2014  . Trichomoniasis 08/08/2013    Past Surgical History:  Procedure Laterality Date  . NO PAST SURGERIES      Current Outpatient Rx  . Order #: [id number] Class: Print  . Order #: [id number] Class: Print  . Order #: [id number] Class: Print  . Order #: [id number] Class: Historical Med    Allergies Eggs or egg-derived products  Family History  Problem Relation Age of Onset  . Asthma Mother   . Asthma Sister   . Asthma Brother   . Alcohol abuse Neg Hx   . Arthritis Neg Hx   . Birth defects Neg Hx   . Cancer Neg Hx   . COPD Neg Hx   . Depression Neg Hx   . Diabetes Neg Hx   . Drug abuse Neg Hx   . Early death Neg Hx   . Hearing loss Neg Hx   . Heart disease Neg Hx   . Hyperlipidemia Neg Hx   . Hypertension Neg Hx   . Kidney disease Neg Hx   . Learning disabilities Neg Hx   . Mental illness Neg Hx   . Mental retardation Neg Hx   . Miscarriages / Stillbirths Neg Hx   . Stroke Neg Hx   . Vision loss Neg Hx   . Varicose Veins Neg Hx     Social History Social History   Tobacco Use  . Smoking status: Never Smoker  . Smokeless tobacco: Never Used  Substance Use Topics  . Alcohol use: No  . Drug use: No    Review of Systems  All other systems  negative except as documented in the HPI. All pertinent positives and negatives as reviewed in the HPI. ____________________________________________   PHYSICAL EXAM:  VITAL SIGNS: ED Triage Vitals [02/04/18 2146]  Enc Vitals Group     BP (!) 131/92     Pulse Rate (!) 155     Resp 20     Temp (!) 100.9 F (38.3 C)     Temp Source Oral     SpO2 100 %     Weight      Height      Head Circumference      Peak Flow      Pain Score      Pain Loc      Pain Edu?      Excl. in GC?     Constitutional: Alert and oriented. Well appearing and in no acute distress. Eyes: Conjunctivae are normal. PERRL. EOMI. Head: Atraumatic. Nose: No congestion/rhinnorhea. Mouth/Throat: Mucous membranes are moist.  Oropharynx non-erythematous. Neck: No stridor.  No meningeal signs.   Cardiovascular: Normal rate, regular rhythm. Good peripheral circulation. Grossly normal heart  sounds.   Respiratory: Normal respiratory effort.  No retractions. Lungs CTAB. Gastrointestinal: Soft and mildly tender. No distention.  Musculoskeletal: No lower extremity tenderness nor edema. No gross deformities of extremities. Neurologic:  Normal speech and language. No gross focal neurologic deficits are appreciated.  Skin:  Skin is warm, dry and intact. No rash noted.  ____________________________________________   LABS (all labs ordered are listed, but only abnormal results are displayed)  Labs Reviewed  URINALYSIS, ROUTINE W REFLEX MICROSCOPIC - Abnormal; Notable for the following components:      Result Value   Ketones, ur 5 (*)    Leukocytes, UA TRACE (*)    All other components within normal limits  BASIC METABOLIC PANEL - Abnormal; Notable for the following components:   Potassium 3.3 (*)    CO2 21 (*)    Glucose, Bld 116 (*)    All other components within normal limits  CBC - Abnormal; Notable for the following components:   RBC 5.28 (*)    Hemoglobin 11.7 (*)    MCV 77.3 (*)    MCH 22.2 (*)    MCHC  28.7 (*)    All other components within normal limits  LIPASE, BLOOD  HEPATIC FUNCTION PANEL  I-STAT BETA HCG BLOOD, ED ([facility], [facility], [facility] ONLY)  I-STAT TROPONIN, ED  I-STAT BETA HCG BLOOD, ED ([facility], [facility], [facility] ONLY)  I-STAT CG4 LACTIC ACID, ED   ____________________________________________  EKG   EKG Interpretation  Date/Time:  Friday February 04 2018 21:54:42 EST Ventricular Rate:  129 PR Interval:    QRS Duration: 78 QT Interval:  400 QTC Calculation: 586 R Axis:   55 Text Interpretation:   Critical Test Result: Long QTc Sinus tachycardia with short PR ST & T wave abnormality, consider inferolateral ischemia Abnormal ECG possibly rate related changes? No old tracing to compare Confirmed by Marily Memos [phone]) on 02/04/2018 11:16:28 PM        ____________________________________________  RADIOLOGY  Dg Chest 2 View  Result Date: 02/04/2018 CLINICAL DATA:  Chest pain EXAM: CHEST - 2 VIEW COMPARISON:  06/06/2012 FINDINGS: The heart size and mediastinal contours are within normal limits. Both lungs are clear. The visualized skeletal structures are unremarkable. Left nipple ring IMPRESSION: No active cardiopulmonary disease. Electronically Signed   By: Jasmine Pang M.D.   On: 02/04/2018 22:30   Ct Abdomen Pelvis W Contrast  Result Date: 02/05/2018 CLINICAL DATA:  Nausea and vomiting. Abdominal pain. EXAM: CT ABDOMEN AND PELVIS WITH CONTRAST TECHNIQUE: Multidetector CT imaging of the abdomen and pelvis was performed using the standard protocol following bolus administration of intravenous contrast. CONTRAST:  OMNIPAQUE IOHEXOL 300 MG/ML  SOLN COMPARISON:  Pelvic ultrasound 11/21/2015 FINDINGS: Lower chest: The lung bases are clear. Hepatobiliary: No focal liver abnormality is seen. No gallstones, gallbladder wall thickening, or biliary dilatation. Pancreas: No ductal dilatation or inflammation. Spleen: Normal in size without focal abnormality. Adrenals/Urinary Tract: Normal adrenal  glands. No hydronephrosis or perinephric edema. Homogeneous renal enhancement. Urinary bladder is nondistended without wall thickening. Stomach/Bowel: Stomach is nondistended. No bowel dilatation, inflammation or evidence of wall thickening. Mild fecalization of distal small bowel contents suggesting slow transit. Normal appendix. Vascular/Lymphatic: Normal caliber abdominal aorta. Prominent left ovarian vein at 8 mm with reflux of contrast, but no increased adnexal vascularity. No enlarged abdominal or pelvic lymph nodes. Reproductive: Retroverted uterus. No gross adnexal mass. Other: Small fat containing umbilical hernia. Small fat containing supraumbilical hernia. No bowel involvement or inflammatory change. Musculoskeletal: There are no acute  or suspicious osseous abnormalities. IMPRESSION: 1. No acute findings. 2. Mild fecalization of distal small bowel contents can be seen with slow transit, however no increased stool burden to suggest constipation. Electronically Signed   By: Narda Rutherford M.D.   On: 02/05/2018 01:15    ____________________________________________   INITIAL IMPRESSION / ASSESSMENT AND PLAN / ED COURSE  Ct negative. Labs negative. Viral? With such vast improvement in symptoms, doubt bacterial or metabolic causes at this time.   Patient able tolerate PO. HR improved with fluids.   Plan to dc w/ zofran.   Pertinent labs & imaging results that were available during my care of the patient were reviewed by me and considered in my medical decision making (see chart for details).  ____________________________________________  FINAL CLINICAL IMPRESSION(S) / ED DIAGNOSES  Final diagnoses:  Non-intractable vomiting with nausea, unspecified vomiting type     MEDICATIONS GIVEN DURING THIS VISIT:  Medications  lactated ringers bolus 1,000 mL (0 mLs Intravenous Stopped 02/05/18 0245)  fentaNYL (SUBLIMAZE) injection 50 mcg (50 mcg Intravenous Given 02/05/18 0013)    ondansetron (ZOFRAN) injection 4 mg (4 mg Intravenous Given 02/05/18 0013)  iohexol (OMNIPAQUE) 300 MG/ML solution 100 mL (100 mLs Intravenous Contrast Given 02/05/18 0034)     NEW OUTPATIENT MEDICATIONS STARTED DURING THIS VISIT:  New Prescriptions   ONDANSETRON (ZOFRAN) 4 MG TABLET    Take 1 tablet (4 mg total) by mouth every 8 (eight) hours as needed for nausea or vomiting.    Note:  This note was prepared with assistance of Dragon voice recognition software. Occasional wrong-word or sound-a-like substitutions may have occurred due to the inherent limitations of voice recognition software.   Marily Memos, MD 02/05/18 [phone]

## 2018-02-04 NOTE — ED Notes (Signed)
Pt ambulatory to the restroom for a urine sample  

## 2018-02-05 ENCOUNTER — Encounter (HOSPITAL_COMMUNITY): Payer: Self-pay

## 2018-02-05 ENCOUNTER — Emergency Department (HOSPITAL_COMMUNITY): Payer: Medicaid Other

## 2018-02-05 LAB — HEPATIC FUNCTION PANEL
ALT: 12 U/L (ref 0–44)
AST: 21 U/L (ref 15–41)
Albumin: 4 g/dL (ref 3.5–5.0)
Alkaline Phosphatase: 61 U/L (ref 38–126)
Bilirubin, Direct: <0.1 mg/dL (ref 0.0–0.2)
Total Bilirubin: 0.8 mg/dL (ref 0.3–1.2)
Total Protein: 8.1 g/dL (ref 6.5–8.1)

## 2018-02-05 LAB — URINALYSIS, ROUTINE W REFLEX MICROSCOPIC
Bacteria, UA: NONE SEEN
Bilirubin Urine: NEGATIVE
Glucose, UA: NEGATIVE mg/dL
Hgb urine dipstick: NEGATIVE
Ketones, ur: 5 mg/dL — AB
Nitrite: NEGATIVE
Protein, ur: NEGATIVE mg/dL
Specific Gravity, Urine: 1.025 (ref 1.005–1.030)
pH: 8 (ref 5.0–8.0)

## 2018-02-05 MED ORDER — ONDANSETRON HCL 4 MG PO TABS: 4.0000 mg | ORAL_TABLET | Freq: Three times a day (TID) | ORAL | 0 refills | Status: DC | PRN

## 2018-02-05 MED ORDER — ONDANSETRON HCL 4 MG/2ML IJ SOLN
4.0000 mg | Freq: Once | INTRAMUSCULAR | Status: AC
  Administered 2018-02-05: 4 mg via INTRAVENOUS
  Filled 2018-02-05: qty 2

## 2018-02-05 MED ORDER — FENTANYL CITRATE (PF) 100 MCG/2ML IJ SOLN
50.0000 ug | Freq: Once | INTRAMUSCULAR | Status: AC
  Administered 2018-02-05: 50 ug via INTRAVENOUS
  Filled 2018-02-05: qty 2

## 2018-02-05 MED ORDER — IOHEXOL 300 MG/ML  SOLN
100.0000 mL | Freq: Once | INTRAMUSCULAR | Status: AC | PRN
  Administered 2018-02-05: 100 mL via INTRAVENOUS

## 2018-02-05 MED ORDER — LACTATED RINGERS IV BOLUS
1000.0000 mL | Freq: Once | INTRAVENOUS | Status: AC
  Administered 2018-02-05: 1000 mL via INTRAVENOUS

## 2018-02-05 NOTE — ED Notes (Signed)
Pt discharged from ED; instructions provided and scripts given; Pt encouraged to return to ED if symptoms worsen and to f/u with PCP; Pt verbalized understanding of all instructions 

## 2018-02-05 NOTE — ED Notes (Signed)
Food given to pt.  

## 2018-02-17 ENCOUNTER — Encounter (HOSPITAL_COMMUNITY): Payer: Self-pay | Admitting: Emergency Medicine

## 2018-02-17 ENCOUNTER — Emergency Department (HOSPITAL_COMMUNITY): Payer: Medicaid Other

## 2018-02-17 ENCOUNTER — Other Ambulatory Visit: Payer: Self-pay

## 2018-02-17 ENCOUNTER — Inpatient Hospital Stay (HOSPITAL_COMMUNITY)
Admission: EM | Admit: 2018-02-17 | Discharge: 2018-02-20 | DRG: 872 | Disposition: A | Discharge status: Home / Self Care | Payer: Medicaid Other | Attending: Internal Medicine | Admitting: Internal Medicine

## 2018-02-17 DIAGNOSIS — M545 Low back pain: Secondary | ICD-10-CM

## 2018-02-17 DIAGNOSIS — M549 Dorsalgia, unspecified: Secondary | ICD-10-CM

## 2018-02-17 DIAGNOSIS — E876 Hypokalemia: Secondary | ICD-10-CM

## 2018-02-17 DIAGNOSIS — J101 Influenza due to other identified influenza virus with other respiratory manifestations: Secondary | ICD-10-CM | POA: Diagnosis present

## 2018-02-17 DIAGNOSIS — N3090 Cystitis, unspecified without hematuria: Secondary | ICD-10-CM | POA: Diagnosis present

## 2018-02-17 DIAGNOSIS — N390 Urinary tract infection, site not specified: Secondary | ICD-10-CM

## 2018-02-17 DIAGNOSIS — E869 Volume depletion, unspecified: Secondary | ICD-10-CM | POA: Diagnosis present

## 2018-02-17 DIAGNOSIS — A4151 Sepsis due to Escherichia coli [E. coli]: Principal | ICD-10-CM | POA: Diagnosis present

## 2018-02-17 DIAGNOSIS — A419 Sepsis, unspecified organism: Secondary | ICD-10-CM

## 2018-02-17 DIAGNOSIS — N12 Tubulo-interstitial nephritis, not specified as acute or chronic: Secondary | ICD-10-CM

## 2018-02-17 LAB — CBC WITH DIFFERENTIAL/PLATELET
Abs Immature Granulocytes: 0.05 10*3/uL (ref 0.00–0.07)
Basophils Absolute: 0 10*3/uL (ref 0.0–0.1)
Basophils Relative: 0 %
Eosinophils Absolute: 0 10*3/uL (ref 0.0–0.5)
Eosinophils Relative: 0 %
HCT: 39 % (ref 36.0–46.0)
Hemoglobin: 11.7 g/dL — ABNORMAL LOW (ref 12.0–15.0)
Immature Granulocytes: 1 %
Lymphocytes Relative: 8 %
Lymphs Abs: 0.6 10*3/uL — ABNORMAL LOW (ref 0.7–4.0)
MCH: 23.4 pg — ABNORMAL LOW (ref 26.0–34.0)
MCHC: 30 g/dL (ref 30.0–36.0)
MCV: 77.8 fL — ABNORMAL LOW (ref 80.0–100.0)
Monocytes Absolute: 0.8 10*3/uL (ref 0.1–1.0)
Monocytes Relative: 11 %
Neutro Abs: 5.8 10*3/uL (ref 1.7–7.7)
Neutrophils Relative %: 80 %
Platelets: 282 10*3/uL (ref 150–400)
RBC: 5.01 MIL/uL (ref 3.87–5.11)
RDW: 13.6 % (ref 11.5–15.5)
WBC: 7.2 10*3/uL (ref 4.0–10.5)
nRBC: 0 % (ref 0.0–0.2)

## 2018-02-17 LAB — COMPREHENSIVE METABOLIC PANEL
ALT: 15 U/L (ref 0–44)
AST: 22 U/L (ref 15–41)
Albumin: 4.3 g/dL (ref 3.5–5.0)
Alkaline Phosphatase: 52 U/L (ref 38–126)
Anion gap: 11 (ref 5–15)
BUN: 7 mg/dL (ref 6–20)
CO2: 24 mmol/L (ref 22–32)
Calcium: 9.1 mg/dL (ref 8.9–10.3)
Chloride: 101 mmol/L (ref 98–111)
Creatinine, Ser: 1 mg/dL (ref 0.44–1.00)
GFR calc Af Amer: >60 mL/min (ref >60)
GFR calc non Af Amer: >60 mL/min (ref >60)
Glucose, Bld: 105 mg/dL — ABNORMAL HIGH (ref 70–99)
Potassium: 2.8 mmol/L — ABNORMAL LOW (ref 3.5–5.1)
Sodium: 136 mmol/L (ref 135–145)
Total Bilirubin: 0.5 mg/dL (ref 0.3–1.2)
Total Protein: 8.7 g/dL — ABNORMAL HIGH (ref 6.5–8.1)

## 2018-02-17 LAB — URINALYSIS, ROUTINE W REFLEX MICROSCOPIC
Bilirubin Urine: NEGATIVE
Glucose, UA: NEGATIVE mg/dL
Ketones, ur: 20 mg/dL — AB
Nitrite: POSITIVE — AB
Protein, ur: 100 mg/dL — AB
Specific Gravity, Urine: 1.015 (ref 1.005–1.030)
WBC, UA: >50 WBC/hpf — ABNORMAL HIGH (ref 0–5)
pH: 6 (ref 5.0–8.0)

## 2018-02-17 LAB — I-STAT BETA HCG BLOOD, ED (NOT ORDERABLE): I-stat hCG, quantitative: <5 m[IU]/mL (ref <5)

## 2018-02-17 LAB — CG4 I-STAT (LACTIC ACID)
Lactic Acid, Venous: 1.07 mmol/L (ref 0.5–1.9)
Lactic Acid, Venous: 0.92 mmol/L (ref 0.5–1.9)

## 2018-02-17 LAB — MRSA PCR SCREENING: MRSA by PCR: NEGATIVE

## 2018-02-17 LAB — PHOSPHORUS: Phosphorus: 2.9 mg/dL (ref 2.5–4.6)

## 2018-02-17 LAB — MAGNESIUM: Magnesium: 2.6 mg/dL — ABNORMAL HIGH (ref 1.7–2.4)

## 2018-02-17 MED ORDER — IOPAMIDOL (ISOVUE-300) INJECTION 61%
100.0000 mL | Freq: Once | INTRAVENOUS | Status: AC | PRN
  Administered 2018-02-17: 100 mL via INTRAVENOUS

## 2018-02-17 MED ORDER — SODIUM CHLORIDE (PF) 0.9 % IJ SOLN
INTRAMUSCULAR | Status: AC
  Administered 2018-02-17: 14:00:00
  Filled 2018-02-17: qty 50

## 2018-02-17 MED ORDER — LACTATED RINGERS IV BOLUS
1000.0000 mL | Freq: Once | INTRAVENOUS | Status: AC
  Administered 2018-02-17: 1000 mL via INTRAVENOUS

## 2018-02-17 MED ORDER — ENOXAPARIN SODIUM 40 MG/0.4ML ~~LOC~~ SOLN
40.0000 mg | SUBCUTANEOUS | Status: DC
  Administered 2018-02-17 – 2018-02-19 (×3): 40 mg via SUBCUTANEOUS
  Filled 2018-02-17 (×3): qty 0.4

## 2018-02-17 MED ORDER — FENTANYL CITRATE (PF) 100 MCG/2ML IJ SOLN
50.0000 ug | Freq: Once | INTRAMUSCULAR | Status: AC
  Administered 2018-02-17: 50 ug via INTRAVENOUS
  Filled 2018-02-17: qty 2

## 2018-02-17 MED ORDER — SODIUM CHLORIDE 0.9 % IV SOLN
500.0000 mg | INTRAVENOUS | Status: DC
  Administered 2018-02-17: 500 mg via INTRAVENOUS
  Filled 2018-02-17: qty 500

## 2018-02-17 MED ORDER — SODIUM CHLORIDE 0.9 % IV SOLN
2.0000 g | INTRAVENOUS | Status: DC
  Administered 2018-02-17 – 2018-02-20 (×4): 2 g via INTRAVENOUS
  Filled 2018-02-17 (×3): qty 2
  Filled 2018-02-17: qty 20

## 2018-02-17 MED ORDER — IOPAMIDOL (ISOVUE-300) INJECTION 61%
INTRAVENOUS | Status: AC
  Administered 2018-02-17: 14:00:00
  Filled 2018-02-17: qty 100

## 2018-02-17 MED ORDER — POTASSIUM CHLORIDE 10 MEQ/100ML IV SOLN
10.0000 meq | INTRAVENOUS | Status: AC
  Administered 2018-02-17 (×6): 10 meq via INTRAVENOUS
  Filled 2018-02-17 (×7): qty 100

## 2018-02-17 MED ORDER — MAGNESIUM SULFATE 2 GM/50ML IV SOLN
2.0000 g | Freq: Once | INTRAVENOUS | Status: AC
  Administered 2018-02-17: 2 g via INTRAVENOUS
  Filled 2018-02-17: qty 50

## 2018-02-17 MED ORDER — POTASSIUM CHLORIDE IN NACL 20-0.9 MEQ/L-% IV SOLN
INTRAVENOUS | Status: DC
  Administered 2018-02-17 – 2018-02-19 (×3): via INTRAVENOUS
  Administered 2018-02-19: 100 mL/h via INTRAVENOUS
  Filled 2018-02-17 (×5): qty 1000

## 2018-02-17 MED ORDER — ACETAMINOPHEN 325 MG PO TABS
650.0000 mg | ORAL_TABLET | Freq: Once | ORAL | Status: AC | PRN
  Administered 2018-02-17: 650 mg via ORAL
  Filled 2018-02-17: qty 2

## 2018-02-17 MED ORDER — MORPHINE SULFATE (PF) 2 MG/ML IV SOLN
2.0000 mg | INTRAVENOUS | Status: DC | PRN
  Administered 2018-02-17 – 2018-02-18 (×2): 2 mg via INTRAVENOUS
  Filled 2018-02-17 (×2): qty 1

## 2018-02-17 MED ORDER — IBUPROFEN 200 MG PO TABS
400.0000 mg | ORAL_TABLET | Freq: Once | ORAL | Status: AC
  Administered 2018-02-17: 400 mg via ORAL
  Filled 2018-02-17: qty 2

## 2018-02-17 NOTE — ED Notes (Signed)
ED TO INPATIENT HANDOFF REPORT  Name/Age/Gender Anita Robinson 22 y.o. female  Code Status Code Status History    Date Active Date Inactive Code Status Order ID Comments User Context   11/21/2014 0153 11/23/2014 1540 Full Code [id number]  Catalina Antigua, MD Inpatient   12/23/2013 0201 12/25/2013 1957 Full Code [id number]  Karena Addison, Student-MidWife Inpatient   12/22/2013 1519 12/23/2013 0201 Full Code [id number]  Danae Orleans, CNM Inpatient      Home/SNF/Other Given to floor  Chief Complaint body aches / fever   Level of Care/Admitting Diagnosis ED Disposition    ED Disposition Condition Comment   Admit  Hospital Area: [facility] [[id number]]  Level of Care: Stepdown [14]  Admit to SDU based on following criteria: Hemodynamic compromise or significant risk of instability:  Patient requiring short term acute titration and management of vasoactive drips, and invasive monitoring (i.e., CVP and Arterial line).  Diagnosis: Sepsis secondary to UTI [facility]) [[id number]]  Admitting Physician: Barnetta Chapel [[id number]]  Attending Physician: Berton Mount I [[id number]]  Estimated length of stay: past midnight tomorrow  Certification:: I certify this patient will need inpatient services for at least 2 midnights  PT Class (Do Not Modify): Inpatient [101]  PT Acc Code (Do Not Modify): Private [1]       Medical History Past Medical History:  Diagnosis Date  . Anemia    on Iron  . Complication of anesthesia    has never had any type of anesthesia.  . Low iron     Allergies Allergies  Allergen Reactions  . Eggs Or Egg-Derived Products     Pt says she get bumps     IV Location/Drains/Wounds Patient Lines/Drains/Airways Status   Active Line/Drains/Airways    Name:   Placement date:   Placement time:   Site:   Days:   Peripheral IV 01/17/17 Left Antecubital   01/17/17    1851    Antecubital   396   Peripheral IV 02/05/18 Right Antecubital   02/05/18    0013     Antecubital   12   Peripheral IV 02/17/18 Right Antecubital   02/17/18    1049    Antecubital   less than 1          Labs/Imaging Results for orders placed or performed during the hospital encounter of 02/17/18 (from the past 48 hour(s))  Comprehensive metabolic panel     Status: Abnormal   Collection Time: 02/17/18 10:40 AM  Result Value Ref Range   Sodium 136 135 - 145 mmol/L   Potassium 2.8 (L) 3.5 - 5.1 mmol/L   Chloride 101 98 - 111 mmol/L   CO2 24 22 - 32 mmol/L   Glucose, Bld 105 (H) 70 - 99 mg/dL   BUN 7 6 - 20 mg/dL   Creatinine, Ser 4.01 0.44 - 1.00 mg/dL   Calcium 9.1 8.9 - 02.7 mg/dL   Total Protein 8.7 (H) 6.5 - 8.1 g/dL   Albumin 4.3 3.5 - 5.0 g/dL   AST 22 15 - 41 U/L   ALT 15 0 - 44 U/L   Alkaline Phosphatase 52 38 - 126 U/L   Total Bilirubin 0.5 0.3 - 1.2 mg/dL   GFR calc non Af Amer >60 >60 mL/min   GFR calc Af Amer >60 >60 mL/min   Anion gap 11 5 - 15    Comment: Performed at [facility], [address], [place], [place] [address]  CBC with Differential  Status: Abnormal   Collection Time: 02/17/18 10:40 AM  Result Value Ref Range   WBC 7.2 4.0 - 10.5 K/uL   RBC 5.01 3.87 - 5.11 MIL/uL   Hemoglobin 11.7 (L) 12.0 - 15.0 g/dL   HCT 96.239.0 95.236.0 - 84.146.0 %   MCV 77.8 (L) 80.0 - 100.0 fL   MCH 23.4 (L) 26.0 - 34.0 pg   MCHC 30.0 30.0 - 36.0 g/dL   RDW 32.413.6 40.111.5 - 02.715.5 %   Platelets 282 150 - 400 K/uL   nRBC 0.0 0.0 - 0.2 %   Neutrophils Relative % 80 %   Neutro Abs 5.8 1.7 - 7.7 K/uL   Lymphocytes Relative 8 %   Lymphs Abs 0.6 (L) 0.7 - 4.0 K/uL   Monocytes Relative 11 %   Monocytes Absolute 0.8 0.1 - 1.0 K/uL   Eosinophils Relative 0 %   Eosinophils Absolute 0.0 0.0 - 0.5 K/uL   Basophils Relative 0 %   Basophils Absolute 0.0 0.0 - 0.1 K/uL   Immature Granulocytes 1 %   Abs Immature Granulocytes 0.05 0.00 - 0.07 K/uL    Comment: Performed at [facility], [address] [place], [place] [address]  I-Stat  beta hCG blood, ED     Status: None   Collection Time: 02/17/18 10:46 AM  Result Value Ref Range   I-stat hCG, quantitative <5.0 <5 mIU/mL   Comment 3            Comment:   GEST. AGE      CONC.  (mIU/mL)   <=1 WEEK        5 - 50     2 WEEKS       50 - 500     3 WEEKS       100 - 10,000     4 WEEKS     1,000 - 30,000        FEMALE AND NON-PREGNANT FEMALE:     LESS THAN 5 mIU/mL   CG4 I-STAT (Lactic acid)     Status: None   Collection Time: 02/17/18 10:48 AM  Result Value Ref Range   Lactic Acid, Venous 1.07 0.5 - 1.9 mmol/L  Urinalysis, Routine w reflex microscopic     Status: Abnormal   Collection Time: 02/17/18 10:49 AM  Result Value Ref Range   Color, Urine YELLOW YELLOW   APPearance CLOUDY (A) CLEAR   Specific Gravity, Urine 1.015 1.005 - 1.030   pH 6.0 5.0 - 8.0   Glucose, UA NEGATIVE NEGATIVE mg/dL   Hgb urine dipstick MODERATE (A) NEGATIVE   Bilirubin Urine NEGATIVE NEGATIVE   Ketones, ur 20 (A) NEGATIVE mg/dL   Protein, ur 440100 (A) NEGATIVE mg/dL   Nitrite POSITIVE (A) NEGATIVE   Leukocytes, UA LARGE (A) NEGATIVE   RBC / HPF 21-50 0 - 5 RBC/hpf   WBC, UA >50 (H) 0 - 5 WBC/hpf   Bacteria, UA MANY (A) NONE SEEN   Squamous Epithelial / LPF 0-5 0 - 5   WBC Clumps PRESENT    Mucus PRESENT     Comment: Performed at [facility], [address] [place], [place] [address]  CG4 I-STAT (Lactic acid)     Status: None   Collection Time: 02/17/18 12:01 PM  Result Value Ref Range   Lactic Acid, Venous 0.92 0.5 - 1.9 mmol/L   Dg Chest 2 View  Result Date: 02/17/2018 CLINICAL DATA:  Fever, sick x1 day EXAM: CHEST - 2  VIEW COMPARISON:  02/04/2018 FINDINGS: Lungs are clear.  No pleural effusion or pneumothorax. The heart is normal in size. Visualized osseous structures are within normal limits. IMPRESSION: Normal chest radiographs. Electronically Signed   By: Charline Bills M.D.   On: 02/17/2018 09:59   Ct Abdomen Pelvis W Contrast  Result Date:  02/17/2018 CLINICAL DATA:  Episode of dysuria 1 week ago. Fever and chest congestion beginning last night. EXAM: CT ABDOMEN AND PELVIS WITH CONTRAST TECHNIQUE: Multidetector CT imaging of the abdomen and pelvis was performed using the standard protocol following bolus administration of intravenous contrast. CONTRAST:  100 mL ISOVUE-300 IOPAMIDOL (ISOVUE-300) INJECTION 61% COMPARISON:  CT abdomen and pelvis 02/05/2018. FINDINGS: Lower chest: Lung bases are clear. No pleural or pericardial effusion. Heart size is normal. Hepatobiliary: No focal liver abnormality is seen. No gallstones, gallbladder wall thickening, or biliary dilatation. Pancreas: Unremarkable. No pancreatic ductal dilatation or surrounding inflammatory changes. Spleen: Normal in size without focal abnormality. Adrenals/Urinary Tract: Patchy areas of decreased cortical enhancement are seen bilaterally, new since the prior exam. There is no hydronephrosis or mass. The urinary bladder is incompletely distended. Its walls are mildly thickened. Stomach/Bowel: Stomach is within normal limits. Appendix appears normal. No evidence of bowel wall thickening, distention, or inflammatory changes. Vascular/Lymphatic: No significant vascular findings are present. No enlarged abdominal or pelvic lymph nodes. Reproductive: Uterus and bilateral adnexa are unremarkable. Other: Small umbilical hernia noted. Musculoskeletal: No acute or focal bony abnormality. IMPRESSION: Areas of decreased cortical enhancement in both kidneys bilaterally are consistent with pyelonephritis. This finding is new since the prior CT. Mild thickening of the urinary bladder walls is most worrisome for cystitis given the appearance of the kidneys. Small umbilical hernia. Electronically Signed   By: Drusilla Kanner M.D.   On: 02/17/2018 14:34   Ct L-spine No Charge  Result Date: 02/17/2018 CLINICAL DATA:  22 year old female with dysuria and back pain. EXAM: All caps that TECHNIQUE:  Technique: Multiplanar CT images of the lumbar spine were reconstructed from contemporary CT of the Abdomen and Pelvis. CONTRAST:  None additional COMPARISON:  CT Abdomen and Pelvis today reported separately. CT Abdomen and Pelvis 02/05/2018, 11/20/2014. FINDINGS: Segmentation: Normal. Alignment: Stable since 2016 with relatively normal lumbar lordosis. Minimal levoconvex lumbar curvature. Vertebrae: No osseous abnormality identified. Negative visible sacrum and SI joints. Paraspinal and other soft tissues: Abdominal and pelvic viscera are reported separately today. Negative visualized posterior paraspinal soft tissues. Disc levels: T12-L1:  Negative. L1-L2:  Negative. L2-L3:  Negative. L3-L4:  Negative. L4-L5:  Mild if any disc bulging. Otherwise negative, no stenosis. L5-S1:  Negative. IMPRESSION: 1. Negative CT appearance of the Lumbar Spine. 2. CT Abdomen and Pelvis today reported separately. Electronically Signed   By: Odessa Fleming M.D.   On: 02/17/2018 14:34    Pending Labs Unresulted Labs (From admission, onward)    Start     Ordered   02/17/18 1536  Magnesium  ONCE - STAT,   STAT     02/17/18 1535   02/17/18 1035  Blood Culture (routine x 2)  BLOOD CULTURE X 2,   STAT     02/17/18 1034   Signed and Held  HIV antibody (Routine Testing)  Once,   R     Signed and Held   Signed and Held  CBC  (enoxaparin (LOVENOX)    CrCl >/= 30 ml/min)  Once,   R    Comments:  Baseline for enoxaparin therapy IF NOT ALREADY DRAWN.  Notify MD if PLT <  100 K.    Signed and Held   Signed and Held  Creatinine, serum  (enoxaparin (LOVENOX)    CrCl >/= 30 ml/min)  Once,   R    Comments:  Baseline for enoxaparin therapy IF NOT ALREADY DRAWN.    Signed and Held   Signed and Held  Creatinine, serum  (enoxaparin (LOVENOX)    CrCl >/= 30 ml/min)  Weekly,   R    Comments:  while on enoxaparin therapy    Signed and Held   Signed and Held  Magnesium  Once,   R     Signed and Held   Signed and Held  Phosphorus  Once,   R      Signed and Held   Signed and Held  Urine culture  Once,   R     Signed and Held   Signed and Armed forces training and education officerHeld  Basic metabolic panel  Tomorrow morning,   R     Signed and Held   Signed and Held  CBC  Tomorrow morning,   R     Signed and Held          Vitals/Pain Today's Vitals   02/17/18 1520 02/17/18 1536 02/17/18 1600 02/17/18 1648  BP: 122/80  118/77   Pulse: (!) 124  (!) 133   Resp: 19  18   Temp:  (!) 102.9 F (39.4 C)    TempSrc:  Oral    SpO2: 100%  100%   Weight:      Height:      PainSc:    6     Isolation Precautions No active isolations  Medications Medications  cefTRIAXone (ROCEPHIN) 2 g in sodium chloride 0.9 % 100 mL IVPB (0 g Intravenous Stopped 02/17/18 1126)  azithromycin (ZITHROMAX) 500 mg in sodium chloride 0.9 % 250 mL IVPB (0 mg Intravenous Stopped 02/17/18 1343)  iopamidol (ISOVUE-300) 61 % injection (has no administration in time range)  sodium chloride (PF) 0.9 % injection (has no administration in time range)  lactated ringers bolus 1,000 mL (1,000 mLs Intravenous New Bag/Given 02/17/18 1552)  potassium chloride 10 mEq in 100 mL IVPB (10 mEq Intravenous New Bag/Given 02/17/18 1552)  magnesium sulfate IVPB 2 g 50 mL (2 g Intravenous New Bag/Given 02/17/18 1551)  lactated ringers bolus 1,000 mL (has no administration in time range)  acetaminophen (TYLENOL) tablet 650 mg (650 mg Oral Given 02/17/18 1024)  lactated ringers bolus 1,000 mL (1,000 mLs Intravenous New Bag/Given 02/17/18 1429)  iopamidol (ISOVUE-300) 61 % injection 100 mL (100 mLs Intravenous Contrast Given 02/17/18 1400)  ibuprofen (ADVIL,MOTRIN) tablet 400 mg (400 mg Oral Given 02/17/18 1553)  fentaNYL (SUBLIMAZE) injection 50 mcg (50 mcg Intravenous Given 02/17/18 1554)    Mobility walks with person assist

## 2018-02-17 NOTE — ED Notes (Signed)
Patient given ginger ale. 

## 2018-02-17 NOTE — H&P (Signed)
History and Physical  Anita Robinson MWU:[id number] DOB: 09-16-1995 DOA: 02/17/2018  Referring physician: ER physician PCP: Patient, No Pcp Per  Outpatient Specialists: None Patient coming from: Home  Chief Complaint: Fever and dysuria  HPI:  Patient is a 22 year old African-American female with past medical history significant for anemia.  According to the patient, her problem started about 2 weeks ago with back pain, abdominal pain and dysuria.  Patient also reported associated urinary frequency.  Patient was never placed on antibiotics now worked up fully.  Subsequently, patient developed worsening dysuria, urinary frequency, abdominal pain, back pain fever and chills.  There is associated nausea, but no vomiting.  No headache, no neck pain, no URI symptoms, no chest pain, no shortness of breath and no joint pains.  UA done in the emergency room was suggestive of UTI.  CT scan of the abdomen and pelvis revealed findings consistent with pyelonephritis and cystitis.  Renal panel revealed potassium of 2.8.  Tachycardia of 111 beats per minute, low systolic BP and T Max of 103.3F is documented.  Patient will be admitted for further assessment and management.  ED Course: Sepsis protocol was activated.  Patient has been aggressively volume resuscitated, pancultured and started on antibiotics.  Pertinent labs: Potassium of 2.8.  Urine suggestive of UTI.  CT scan of abdomen and pelvis revealed findings consistent with pyelonephritis and cystitis.  EKG: Independently reviewed.  Sinus tachycardia noted.  Imaging: independently reviewed.   Review of Systems:  Negative for fever, visual changes, sore throat, rash, new muscle aches, chest pain, SOB, bleeding.  Past Medical History:  Diagnosis Date  . Anemia    on Iron  . Complication of anesthesia    has never had any type of anesthesia.  . Low iron     Past Surgical History:  Procedure Laterality Date  . NO PAST SURGERIES       reports  that she has never smoked. She has never used smokeless tobacco. She reports that she does not drink alcohol or use drugs.  Allergies  Allergen Reactions  . Eggs Or Egg-Derived Products     Pt says she get bumps     Family History  Problem Relation Age of Onset  . Asthma Mother   . Asthma Sister   . Asthma Brother   . Alcohol abuse Neg Hx   . Arthritis Neg Hx   . Birth defects Neg Hx   . Cancer Neg Hx   . COPD Neg Hx   . Depression Neg Hx   . Diabetes Neg Hx   . Drug abuse Neg Hx   . Early death Neg Hx   . Hearing loss Neg Hx   . Heart disease Neg Hx   . Hyperlipidemia Neg Hx   . Hypertension Neg Hx   . Kidney disease Neg Hx   . Learning disabilities Neg Hx   . Mental illness Neg Hx   . Mental retardation Neg Hx   . Miscarriages / Stillbirths Neg Hx   . Stroke Neg Hx   . Vision loss Neg Hx   . Varicose Veins Neg Hx      Prior to Admission medications   Medication Sig Start Date End Date Taking? Authorizing Provider  aspirin-acetaminophen-caffeine (EXCEDRIN MIGRAINE) [phone] MG tablet Take 2 tablets by mouth every 6 (six) hours as needed for headache.   Yes [provider]  metroNIDAZOLE (FLAGYL) 500 MG tablet Take 1 tablet (500 mg total) by mouth 2 (two) times daily. 10/28/16  Yes Raeford Razor, MD  ondansetron (ZOFRAN) 4 MG tablet Take 1 tablet (4 mg total) by mouth every 8 (eight) hours as needed for nausea or vomiting. 02/05/18  Yes Mesner, Barbara Cower, MD    Physical Exam: Vitals:   02/17/18 1335 02/17/18 1442 02/17/18 1520 02/17/18 1536  BP:  128/77 122/80   Pulse:  (!) 132 (!) 124   Resp:  (!) 29 19   Temp: (!) 101.1 F (38.4 C) (!) 102.8 F (39.3 C)  (!) 102.9 F (39.4 C)  TempSrc: Oral Oral  Oral  SpO2:  100% 100%   Weight:      Height:         Constitutional:  . Appears calm and comfortable Eyes:  . No pallor. No jaundice.  ENMT:  . external ears, nose appear normal Neck:  . Neck is supple. No JVD Respiratory:  . CTA bilaterally, no  w/r/r.  . Respiratory effort normal. No retractions or accessory muscle use Cardiovascular:  . S1S2 . No LE extremity edema   Abdomen:  . Abdomen is soft and non tender. Organs are difficult to assess. Neurologic:  . Awake and alert. . Moves all limbs.  Wt Readings from Last 3 Encounters:  02/17/18 79.4 kg  04/21/17 77.1 kg  10/28/16 77.1 kg    I have personally reviewed following labs and imaging studies  Labs on Admission:  CBC: Recent Labs  Lab 02/17/18 1040  WBC 7.2  NEUTROABS 5.8  HGB 11.7*  HCT 39.0  MCV 77.8*  PLT 282   Basic Metabolic Panel: Recent Labs  Lab 02/17/18 1040  NA 136  K 2.8*  CL 101  CO2 24  GLUCOSE 105*  BUN 7  CREATININE 1.00  CALCIUM 9.1   Liver Function Tests: Recent Labs  Lab 02/17/18 1040  AST 22  ALT 15  ALKPHOS 52  BILITOT 0.5  PROT 8.7*  ALBUMIN 4.3   No results for input(s): LIPASE, AMYLASE in the last 168 hours. No results for input(s): AMMONIA in the last 168 hours. Coagulation Profile: No results for input(s): INR, PROTIME in the last 168 hours. Cardiac Enzymes: No results for input(s): CKTOTAL, CKMB, CKMBINDEX, TROPONINI in the last 168 hours. BNP (last 3 results) No results for input(s): PROBNP in the last 8760 hours. HbA1C: No results for input(s): HGBA1C in the last 72 hours. CBG: No results for input(s): GLUCAP in the last 168 hours. Lipid Profile: No results for input(s): CHOL, HDL, LDLCALC, TRIG, CHOLHDL, LDLDIRECT in the last 72 hours. Thyroid Function Tests: No results for input(s): TSH, T4TOTAL, FREET4, T3FREE, THYROIDAB in the last 72 hours. Anemia Panel: No results for input(s): VITAMINB12, FOLATE, FERRITIN, TIBC, IRON, RETICCTPCT in the last 72 hours. Urine analysis:    Component Value Date/Time   COLORURINE YELLOW 02/17/2018 1049   APPEARANCEUR CLOUDY (A) 02/17/2018 1049   LABSPEC 1.015 02/17/2018 1049   PHURINE 6.0 02/17/2018 1049   GLUCOSEU NEGATIVE 02/17/2018 1049   HGBUR MODERATE (A)  02/17/2018 1049   BILIRUBINUR NEGATIVE 02/17/2018 1049   KETONESUR 20 (A) 02/17/2018 1049   PROTEINUR 100 (A) 02/17/2018 1049   UROBILINOGEN 1.0 11/20/2014 1945   NITRITE POSITIVE (A) 02/17/2018 1049   LEUKOCYTESUR LARGE (A) 02/17/2018 1049   Sepsis Labs: @LABRCNTIP (procalcitonin:4,lacticidven:4) )No results found for this or any previous visit (from the past 240 hour(s)).    Radiological Exams on Admission: Dg Chest 2 View  Result Date: 02/17/2018 CLINICAL DATA:  Fever, sick x1 day EXAM: CHEST - 2 VIEW COMPARISON:  02/04/2018 FINDINGS: Lungs are clear.  No pleural effusion or pneumothorax. The heart is normal in size. Visualized osseous structures are within normal limits. IMPRESSION: Normal chest radiographs. Electronically Signed   By: Charline BillsSriyesh  Krishnan M.D.   On: 02/17/2018 09:59   Ct Abdomen Pelvis W Contrast  Result Date: 02/17/2018 CLINICAL DATA:  Episode of dysuria 1 week ago. Fever and chest congestion beginning last night. EXAM: CT ABDOMEN AND PELVIS WITH CONTRAST TECHNIQUE: Multidetector CT imaging of the abdomen and pelvis was performed using the standard protocol following bolus administration of intravenous contrast. CONTRAST:  100 mL ISOVUE-300 IOPAMIDOL (ISOVUE-300) INJECTION 61% COMPARISON:  CT abdomen and pelvis 02/05/2018. FINDINGS: Lower chest: Lung bases are clear. No pleural or pericardial effusion. Heart size is normal. Hepatobiliary: No focal liver abnormality is seen. No gallstones, gallbladder wall thickening, or biliary dilatation. Pancreas: Unremarkable. No pancreatic ductal dilatation or surrounding inflammatory changes. Spleen: Normal in size without focal abnormality. Adrenals/Urinary Tract: Patchy areas of decreased cortical enhancement are seen bilaterally, new since the prior exam. There is no hydronephrosis or mass. The urinary bladder is incompletely distended. Its walls are mildly thickened. Stomach/Bowel: Stomach is within normal limits. Appendix appears  normal. No evidence of bowel wall thickening, distention, or inflammatory changes. Vascular/Lymphatic: No significant vascular findings are present. No enlarged abdominal or pelvic lymph nodes. Reproductive: Uterus and bilateral adnexa are unremarkable. Other: Small umbilical hernia noted. Musculoskeletal: No acute or focal bony abnormality. IMPRESSION: Areas of decreased cortical enhancement in both kidneys bilaterally are consistent with pyelonephritis. This finding is new since the prior CT. Mild thickening of the urinary bladder walls is most worrisome for cystitis given the appearance of the kidneys. Small umbilical hernia. Electronically Signed   By: Drusilla Kannerhomas  Dalessio M.D.   On: 02/17/2018 14:34   Ct L-spine No Charge  Result Date: 02/17/2018 CLINICAL DATA:  22 year old female with dysuria and back pain. EXAM: All caps that TECHNIQUE: Technique: Multiplanar CT images of the lumbar spine were reconstructed from contemporary CT of the Abdomen and Pelvis. CONTRAST:  None additional COMPARISON:  CT Abdomen and Pelvis today reported separately. CT Abdomen and Pelvis 02/05/2018, 11/20/2014. FINDINGS: Segmentation: Normal. Alignment: Stable since 2016 with relatively normal lumbar lordosis. Minimal levoconvex lumbar curvature. Vertebrae: No osseous abnormality identified. Negative visible sacrum and SI joints. Paraspinal and other soft tissues: Abdominal and pelvic viscera are reported separately today. Negative visualized posterior paraspinal soft tissues. Disc levels: T12-L1:  Negative. L1-L2:  Negative. L2-L3:  Negative. L3-L4:  Negative. L4-L5:  Mild if any disc bulging. Otherwise negative, no stenosis. L5-S1:  Negative. IMPRESSION: 1. Negative CT appearance of the Lumbar Spine. 2. CT Abdomen and Pelvis today reported separately. Electronically Signed   By: Odessa FlemingH  Hall M.D.   On: 02/17/2018 14:34    EKG: Independently reviewed.   Active Problems:   Sepsis secondary to UTI  [facility](HCC)   Assessment/Plan UTI/sepsis: Panculture patient. IV antibiotics. Volume resuscitation. Further management depend on hospital course.  Hypokalemia: IV KCl 10 M EQ per hour x6 doses Check magnesium level. Repeat renal panel after completion of IV KCl  Volume depletion: Continue aggressive hydration  Continue to monitor renal panel and electrolytes. Further management depend on hospital course.  DVT prophylaxis: Lovenox Code Status: Full Family Communication: None Disposition Plan: Home eventually Consults called: None Admission status: Inpatient   Severity of illness: Patient is a 22 year old African-American female, who has had symptoms related to the urogenital system for the last 1 to 2 weeks without seeking usual medical treatment.  Patient presents with sepsis syndrome.  Source of sepsis likely UTI.  Patient will be managed on an inpatient basis.  This is due to the fact that the patient would likely deteriorate if managed on an outpatient basis.  Time spent: 65 minutes  Berton MountSylvester Su Duma, MD  [facility] Pager #: [phone] 7PM-7AM contact night coverage as above  02/17/2018, 3:54 PM

## 2018-02-17 NOTE — ED Triage Notes (Signed)
Per pt, states she started having some dysuria 1 week ago, frequency and "tingling' when she urinated-states it went away but now it is back-started having a fever last night-chest congestion, body aches-no relief with OTC meds

## 2018-02-18 DIAGNOSIS — J101 Influenza due to other identified influenza virus with other respiratory manifestations: Secondary | ICD-10-CM

## 2018-02-18 LAB — CBC
HCT: 35.5 % — ABNORMAL LOW (ref 36.0–46.0)
Hemoglobin: 10.5 g/dL — ABNORMAL LOW (ref 12.0–15.0)
MCH: 22.9 pg — ABNORMAL LOW (ref 26.0–34.0)
MCHC: 29.6 g/dL — ABNORMAL LOW (ref 30.0–36.0)
MCV: 77.3 fL — ABNORMAL LOW (ref 80.0–100.0)
Platelets: 247 10*3/uL (ref 150–400)
RBC: 4.59 MIL/uL (ref 3.87–5.11)
RDW: 13.6 % (ref 11.5–15.5)
WBC: 6.4 10*3/uL (ref 4.0–10.5)
nRBC: 0 % (ref 0.0–0.2)

## 2018-02-18 LAB — RESPIRATORY PANEL BY PCR
Adenovirus: NOT DETECTED
Bordetella pertussis: NOT DETECTED
Chlamydophila pneumoniae: NOT DETECTED
Coronavirus 229E: NOT DETECTED
Coronavirus HKU1: NOT DETECTED
Coronavirus NL63: NOT DETECTED
Coronavirus OC43: NOT DETECTED
Influenza A: NOT DETECTED
Influenza B: DETECTED — AB
Metapneumovirus: NOT DETECTED
Mycoplasma pneumoniae: NOT DETECTED
Parainfluenza Virus 1: NOT DETECTED
Parainfluenza Virus 2: NOT DETECTED
Parainfluenza Virus 3: NOT DETECTED
Parainfluenza Virus 4: NOT DETECTED
Respiratory Syncytial Virus: NOT DETECTED
Rhinovirus / Enterovirus: NOT DETECTED

## 2018-02-18 LAB — BASIC METABOLIC PANEL
Anion gap: 7 (ref 5–15)
BUN: 5 mg/dL — ABNORMAL LOW (ref 6–20)
CO2: 25 mmol/L (ref 22–32)
Calcium: 8.7 mg/dL — ABNORMAL LOW (ref 8.9–10.3)
Chloride: 107 mmol/L (ref 98–111)
Creatinine, Ser: 0.82 mg/dL (ref 0.44–1.00)
GFR calc Af Amer: >60 mL/min (ref >60)
GFR calc non Af Amer: >60 mL/min (ref >60)
Glucose, Bld: 139 mg/dL — ABNORMAL HIGH (ref 70–99)
Potassium: 3.8 mmol/L (ref 3.5–5.1)
Sodium: 139 mmol/L (ref 135–145)

## 2018-02-18 LAB — RENAL FUNCTION PANEL
Albumin: 3.4 g/dL — ABNORMAL LOW (ref 3.5–5.0)
Anion gap: 7 (ref 5–15)
BUN: 6 mg/dL (ref 6–20)
CO2: 25 mmol/L (ref 22–32)
Calcium: 8.8 mg/dL — ABNORMAL LOW (ref 8.9–10.3)
Chloride: 108 mmol/L (ref 98–111)
Creatinine, Ser: 0.77 mg/dL (ref 0.44–1.00)
GFR calc Af Amer: >60 mL/min (ref >60)
GFR calc non Af Amer: >60 mL/min (ref >60)
Glucose, Bld: 137 mg/dL — ABNORMAL HIGH (ref 70–99)
Phosphorus: 2.6 mg/dL (ref 2.5–4.6)
Potassium: 3.8 mmol/L (ref 3.5–5.1)
Sodium: 140 mmol/L (ref 135–145)

## 2018-02-18 LAB — HIV ANTIBODY (ROUTINE TESTING W REFLEX): HIV Screen 4th Generation wRfx: NONREACTIVE

## 2018-02-18 MED ORDER — ZOLPIDEM TARTRATE 5 MG PO TABS
5.0000 mg | ORAL_TABLET | Freq: Every evening | ORAL | Status: DC | PRN
  Administered 2018-02-18: 5 mg via ORAL
  Filled 2018-02-18: qty 1

## 2018-02-18 MED ORDER — OSELTAMIVIR PHOSPHATE 75 MG PO CAPS
75.0000 mg | ORAL_CAPSULE | Freq: Two times a day (BID) | ORAL | Status: DC
  Administered 2018-02-18 – 2018-02-20 (×5): 75 mg via ORAL
  Filled 2018-02-18 (×5): qty 1

## 2018-02-18 MED ORDER — PROMETHAZINE HCL 25 MG/ML IJ SOLN
12.5000 mg | Freq: Four times a day (QID) | INTRAMUSCULAR | Status: DC | PRN
  Administered 2018-02-18: 12.5 mg via INTRAVENOUS
  Filled 2018-02-18: qty 1

## 2018-02-18 MED ORDER — ACETAMINOPHEN 500 MG PO TABS
1000.0000 mg | ORAL_TABLET | Freq: Three times a day (TID) | ORAL | Status: DC | PRN
  Administered 2018-02-18 – 2018-02-20 (×5): 1000 mg via ORAL
  Filled 2018-02-18 (×5): qty 2

## 2018-02-18 NOTE — Progress Notes (Signed)
PROGRESS NOTE    Anita Robinson  WUJ:[id number] DOB: Aug 22, 1995 DOA: 02/17/2018 PCP: Patient, No Pcp Per  Outpatient Specialists:   Brief Narrative:  Patient is a 22 year old African-American female with past medical history significant for anemia.  Patient was admitted with urinary symptoms suggestive of UTI/sepsis, fever and back pain.  Urine culture still pending.  Influenza B came back positive.  Hypokalemia noted on presentation, but currently repleted.  Patient continues to have fever.  Assessment & Plan:   Active Problems:   Sepsis secondary to UTI [facility])  UTI/sepsis: Panculture patient. IV antibiotics. Volume resuscitation. Follow final culture results.  Hypokalemia: IV KCl 10 M EQ per hour x6 doses Check magnesium level. Repeat renal panel after completion of IV KCl 02/18/2018: Repleted.  Potassium today is 3.8.  We will continue to monitor.  Volume depletion: Continue aggressive hydration 02/18/2018: Improved significantly.  Influenza B: Trial of Tamiflu. Supportive care.  Continue to monitor renal panel and electrolytes. Further management depend on hospital course.  DVT prophylaxis: Lovenox Code Status: Full Family Communication: None Disposition Plan: Home eventually Consults called: None  Consultants:   None.  Procedures:   None.  Antimicrobials:   IV Rocephin.  Patient is also on antiviral, Tamiflu.   Subjective: Patient continues to have fever.  Objective: Vitals:   02/18/18 1125 02/18/18 1200 02/18/18 1315 02/18/18 1400  BP:  133/88  107/89  Pulse:  (!) 140  (!) 121  Resp:  (!) 29  (!) 31  Temp: (!) 102.9 F (39.4 C)  (!) 102.8 F (39.3 C)   TempSrc: Oral  Oral   SpO2:  98%  100%  Weight:      Height:        Intake/Output Summary (Last 24 hours) at 02/18/2018 1505 Last data filed at 02/18/2018 1400 Gross per 24 hour  Intake 6415.66 ml  Output 1100 ml  Net 5315.66 ml   Filed Weights   02/17/18 0941  Weight: 79.4  kg    Examination:  General exam: Appears calm and comfortable.  Not in any distress. Respiratory system: Clear to auscultation. Respiratory effort normal. Cardiovascular system: S1 & S2. No pedal edema. Gastrointestinal system: Abdomen is nondistended, soft and nontender. No organomegaly or masses felt. Normal bowel sounds heard. Central nervous system: Alert and oriented. No focal neurological deficits. Extremities: No leg edema.   Data Reviewed: I have personally reviewed following labs and imaging studies  CBC: Recent Labs  Lab 02/17/18 1040 02/18/18 0113  WBC 7.2 6.4  NEUTROABS 5.8  --   HGB 11.7* 10.5*  HCT 39.0 35.5*  MCV 77.8* 77.3*  PLT 282 247   Basic Metabolic Panel: Recent Labs  Lab 02/17/18 1040 02/17/18 1717 02/18/18 0113  NA 136  --  140  139  K 2.8*  --  3.8  3.8  CL 101  --  108  107  CO2 24  --  25  25  GLUCOSE 105*  --  137*  139*  BUN 7  --  6  5*  CREATININE 1.00  --  0.77  0.82  CALCIUM 9.1  --  8.8*  8.7*  MG  --  2.6*  --   PHOS  --  2.9 2.6   GFR: Estimated Creatinine Clearance: 110.2 mL/min (by C-G formula based on SCr of 0.82 mg/dL). Liver Function Tests: Recent Labs  Lab 02/17/18 1040 02/18/18 0113  AST 22  --   ALT 15  --   ALKPHOS 52  --  BILITOT 0.5  --   PROT 8.7*  --   ALBUMIN 4.3 3.4*   No results for input(s): LIPASE, AMYLASE in the last 168 hours. No results for input(s): AMMONIA in the last 168 hours. Coagulation Profile: No results for input(s): INR, PROTIME in the last 168 hours. Cardiac Enzymes: No results for input(s): CKTOTAL, CKMB, CKMBINDEX, TROPONINI in the last 168 hours. BNP (last 3 results) No results for input(s): PROBNP in the last 8760 hours. HbA1C: No results for input(s): HGBA1C in the last 72 hours. CBG: No results for input(s): GLUCAP in the last 168 hours. Lipid Profile: No results for input(s): CHOL, HDL, LDLCALC, TRIG, CHOLHDL, LDLDIRECT in the last 72 hours. Thyroid Function  Tests: No results for input(s): TSH, T4TOTAL, FREET4, T3FREE, THYROIDAB in the last 72 hours. Anemia Panel: No results for input(s): VITAMINB12, FOLATE, FERRITIN, TIBC, IRON, RETICCTPCT in the last 72 hours. Urine analysis:    Component Value Date/Time   COLORURINE YELLOW 02/17/2018 1049   APPEARANCEUR CLOUDY (A) 02/17/2018 1049   LABSPEC 1.015 02/17/2018 1049   PHURINE 6.0 02/17/2018 1049   GLUCOSEU NEGATIVE 02/17/2018 1049   HGBUR MODERATE (A) 02/17/2018 1049   BILIRUBINUR NEGATIVE 02/17/2018 1049   KETONESUR 20 (A) 02/17/2018 1049   PROTEINUR 100 (A) 02/17/2018 1049   UROBILINOGEN 1.0 11/20/2014 1945   NITRITE POSITIVE (A) 02/17/2018 1049   LEUKOCYTESUR LARGE (A) 02/17/2018 1049   Sepsis Labs: (procalcitonin:4,lacticidven:4)  ) Recent Results (from the past 240 hour(s))  Blood Culture (routine x 2)     Status: None (Preliminary result)   Collection Time: 02/17/18 10:48 AM  Result Value Ref Range Status   Specimen Description   Final    BLOOD RIGHT ANTECUBITAL Performed at [facility], [address], [place], [place] [address]    Special Requests   Final    BOTTLES DRAWN AEROBIC AND ANAEROBIC Blood Culture adequate volume Performed at [facility], [address], [place], [place] [address]    Culture   Final    NO GROWTH < 24 HOURS Performed at [facility], [address] [place], [place] [address]    Report Status PENDING  Incomplete  Blood Culture (routine x 2)     Status: None (Preliminary result)   Collection Time: 02/17/18 11:03 AM  Result Value Ref Range Status   Specimen Description   Final    BLOOD LEFT ARM Performed at [facility], [address], [place], [place] [address]    Special Requests   Final    BOTTLES DRAWN AEROBIC AND ANAEROBIC Blood Culture results may not be optimal due to an excessive volume of blood received in culture bottles Performed at [facility], [address], [place], [place] [address]    Culture   Final    NO GROWTH < 24 HOURS Performed at [facility], [address], [place], [place] [address]    Report Status PENDING  Incomplete  MRSA PCR Screening     Status: None   Collection Time: 02/17/18  5:05 PM  Result Value Ref Range Status   MRSA by PCR NEGATIVE NEGATIVE Final    Comment:        The GeneXpert MRSA Assay (FDA approved for NASAL specimens only), is one component of a comprehensive MRSA colonization surveillance program. It is not intended to diagnose MRSA infection nor to guide or monitor treatment for MRSA infections. Performed at [facility], [address] [place], [place] [address]   Respiratory Panel by PCR     Status: Abnormal   Collection Time: 02/17/18  6:04 PM  Result Value Ref Range Status   Adenovirus NOT DETECTED NOT DETECTED Final   Coronavirus 229E NOT DETECTED NOT DETECTED Final   Coronavirus HKU1 NOT DETECTED NOT DETECTED Final   Coronavirus NL63 NOT DETECTED NOT DETECTED Final   Coronavirus OC43 NOT DETECTED NOT DETECTED Final   Metapneumovirus NOT DETECTED NOT DETECTED Final   Rhinovirus / Enterovirus NOT DETECTED NOT DETECTED Final   Influenza A NOT DETECTED NOT DETECTED Final   Influenza B DETECTED (A) NOT DETECTED Final   Parainfluenza Virus 1 NOT DETECTED NOT DETECTED Final   Parainfluenza Virus 2 NOT DETECTED NOT DETECTED Final   Parainfluenza Virus 3 NOT DETECTED NOT DETECTED Final   Parainfluenza Virus 4 NOT DETECTED NOT DETECTED Final   Respiratory Syncytial Virus NOT DETECTED NOT DETECTED Final   Bordetella pertussis NOT DETECTED NOT DETECTED Final   Chlamydophila pneumoniae NOT DETECTED NOT DETECTED Final   Mycoplasma pneumoniae NOT DETECTED NOT DETECTED Final    Comment: Performed at [facility], [address], [place], [place] [address]         Radiology Studies: Dg Chest 2 View  Result Date: 02/17/2018 CLINICAL  DATA:  Fever, sick x1 day EXAM: CHEST - 2 VIEW COMPARISON:  02/04/2018 FINDINGS: Lungs are clear.  No pleural effusion or pneumothorax. The heart is normal in size. Visualized osseous structures are within normal limits. IMPRESSION: Normal chest radiographs. Electronically Signed   By: Charline Bills M.D.   On: 02/17/2018 09:59   Ct Abdomen Pelvis W Contrast  Result Date: 02/17/2018 CLINICAL DATA:  Episode of dysuria 1 week ago. Fever and chest congestion beginning last night. EXAM: CT ABDOMEN AND PELVIS WITH CONTRAST TECHNIQUE: Multidetector CT imaging of the abdomen and pelvis was performed using the standard protocol following bolus administration of intravenous contrast. CONTRAST:  100 mL ISOVUE-300 IOPAMIDOL (ISOVUE-300) INJECTION 61% COMPARISON:  CT abdomen and pelvis 02/05/2018. FINDINGS: Lower chest: Lung bases are clear. No pleural or pericardial effusion. Heart size is normal. Hepatobiliary: No focal liver abnormality is seen. No gallstones, gallbladder wall thickening, or biliary dilatation. Pancreas: Unremarkable. No pancreatic ductal dilatation or surrounding inflammatory changes. Spleen: Normal in size without focal abnormality. Adrenals/Urinary Tract: Patchy areas of decreased cortical enhancement are seen bilaterally, new since the prior exam. There is no hydronephrosis or mass. The urinary bladder is incompletely distended. Its walls are mildly thickened. Stomach/Bowel: Stomach is within normal limits. Appendix appears normal. No evidence of bowel wall thickening, distention, or inflammatory changes. Vascular/Lymphatic: No significant vascular findings are present. No enlarged abdominal or pelvic lymph nodes. Reproductive: Uterus and bilateral adnexa are unremarkable. Other: Small umbilical hernia noted. Musculoskeletal: No acute or focal bony abnormality. IMPRESSION: Areas of decreased cortical enhancement in both kidneys bilaterally are consistent with pyelonephritis. This finding is new  since the prior CT. Mild thickening of the urinary bladder walls is most worrisome for cystitis given the appearance of the kidneys. Small umbilical hernia. Electronically Signed   By: Drusilla Kanner M.D.   On: 02/17/2018 14:34   Ct L-spine No Charge  Result Date: 02/17/2018 CLINICAL DATA:  22 year old female with dysuria and back pain. EXAM: All caps that TECHNIQUE: Technique: Multiplanar CT images of the lumbar spine were reconstructed from contemporary CT of the Abdomen and Pelvis. CONTRAST:  None additional COMPARISON:  CT Abdomen and Pelvis today reported separately. CT  Abdomen and Pelvis 02/05/2018, 11/20/2014. FINDINGS: Segmentation: Normal. Alignment: Stable since 2016 with relatively normal lumbar lordosis. Minimal levoconvex lumbar curvature. Vertebrae: No osseous abnormality identified. Negative visible sacrum and SI joints. Paraspinal and other soft tissues: Abdominal and pelvic viscera are reported separately today. Negative visualized posterior paraspinal soft tissues. Disc levels: T12-L1:  Negative. L1-L2:  Negative. L2-L3:  Negative. L3-L4:  Negative. L4-L5:  Mild if any disc bulging. Otherwise negative, no stenosis. L5-S1:  Negative. IMPRESSION: 1. Negative CT appearance of the Lumbar Spine. 2. CT Abdomen and Pelvis today reported separately. Electronically Signed   By: Odessa FlemingH  Hall M.D.   On: 02/17/2018 14:34        Scheduled Meds: . enoxaparin (LOVENOX) injection  40 mg Subcutaneous Q24H  . oseltamivir  75 mg Oral BID   Continuous Infusions: . 0.9 % NaCl with KCl 20 mEq / L 100 mL/hr at 02/18/18 1400  . cefTRIAXone (ROCEPHIN)  IV Stopped (02/18/18 1046)     LOS: 1 day    Time spent: 35 minutes.    Berton MountSylvester Ogbata, MD  [facility] Pager #: [phone] 7PM-7AM contact night coverage as above

## 2018-02-18 NOTE — ED Provider Notes (Signed)
[facility]-ICU/STEPDOWN Provider Note   CSN: [id number] Arrival date & time: 02/17/18  4098     History   Chief Complaint Chief Complaint  Patient presents with  . Fever    HPI Anita Robinson is a 22 y.o. female.  The history is provided by the patient.  Fever   This is a new problem. The current episode started yesterday. The problem occurs constantly. The problem has not changed since onset.Associated symptoms include vomiting and muscle aches. Associated symptoms comments: Flank pain. She has tried nothing for the symptoms. The treatment provided no relief.    Past Medical History:  Diagnosis Date  . Anemia    on Iron  . Complication of anesthesia    has never had any type of anesthesia.  . Low iron     Patient Active Problem List   Diagnosis Date Noted  . Sepsis secondary to UTI (HCC) 02/17/2018  . TOA (tubo-ovarian abscess) 11/21/2014  . Trichomoniasis 08/08/2013    Past Surgical History:  Procedure Laterality Date  . NO PAST SURGERIES       OB History    Gravida  1   Para  1   Term  1   Preterm  0   AB  0   Living  1     SAB  0   TAB  0   Ectopic  0   Multiple  0   Live Births  1            Home Medications    Prior to Admission medications   Medication Sig Start Date End Date Taking? Authorizing Provider  aspirin-acetaminophen-caffeine (EXCEDRIN MIGRAINE) [phone] MG tablet Take 2 tablets by mouth every 6 (six) hours as needed for headache.   Yes [provider]  metroNIDAZOLE (FLAGYL) 500 MG tablet Take 1 tablet (500 mg total) by mouth 2 (two) times daily. 10/28/16  Yes Raeford Razor, MD  ondansetron (ZOFRAN) 4 MG tablet Take 1 tablet (4 mg total) by mouth every 8 (eight) hours as needed for nausea or vomiting. 02/05/18  Yes Caitlynne Harbeck, Barbara Cower, MD    Family History Family History  Problem Relation Age of Onset  . Asthma Mother   . Asthma Sister   . Asthma Brother   . Alcohol abuse Neg Hx   .  Arthritis Neg Hx   . Birth defects Neg Hx   . Cancer Neg Hx   . COPD Neg Hx   . Depression Neg Hx   . Diabetes Neg Hx   . Drug abuse Neg Hx   . Early death Neg Hx   . Hearing loss Neg Hx   . Heart disease Neg Hx   . Hyperlipidemia Neg Hx   . Hypertension Neg Hx   . Kidney disease Neg Hx   . Learning disabilities Neg Hx   . Mental illness Neg Hx   . Mental retardation Neg Hx   . Miscarriages / Stillbirths Neg Hx   . Stroke Neg Hx   . Vision loss Neg Hx   . Varicose Veins Neg Hx     Social History Social History   Tobacco Use  . Smoking status: Never Smoker  . Smokeless tobacco: Never Used  Substance Use Topics  . Alcohol use: No  . Drug use: No     Allergies   Eggs or egg-derived products   Review of Systems Review of Systems  Constitutional: Positive for fever.  Gastrointestinal: Positive for vomiting.  All other  systems reviewed and are negative.    Physical Exam Updated Vital Signs BP 133/88   Pulse (!) 140   Temp (!) 102.9 F (39.4 C) (Oral)   Resp (!) 29   Ht 5\' 5"  (1.651 m)   Wt 79.4 kg   LMP 01/29/2018   SpO2 98%   BMI 29.12 kg/m   Physical Exam Vitals signs and nursing note reviewed.  Constitutional:      Appearance: She is well-developed.  HENT:     Head: Normocephalic and atraumatic.     Mouth/Throat:     Mouth: Mucous membranes are dry.  Eyes:     Extraocular Movements: Extraocular movements intact.     Conjunctiva/sclera: Conjunctivae normal.     Pupils: Pupils are equal, round, and reactive to light.  Neck:     Musculoskeletal: Normal range of motion.  Cardiovascular:     Rate and Rhythm: Regular rhythm. Tachycardia present.  Pulmonary:     Effort: Tachypnea present. No respiratory distress.     Breath sounds: No stridor.  Abdominal:     General: There is no distension.  Neurological:     Mental Status: She is alert.      ED Treatments / Results  Labs (all labs ordered are listed, but only abnormal results are  displayed) Labs Reviewed  RESPIRATORY PANEL BY PCR - Abnormal; Notable for the following components:      Result Value   Influenza B DETECTED (*)    All other components within normal limits  COMPREHENSIVE METABOLIC PANEL - Abnormal; Notable for the following components:   Potassium 2.8 (*)    Glucose, Bld 105 (*)    Total Protein 8.7 (*)    All other components within normal limits  CBC WITH DIFFERENTIAL/PLATELET - Abnormal; Notable for the following components:   Hemoglobin 11.7 (*)    MCV 77.8 (*)    MCH 23.4 (*)    Lymphs Abs 0.6 (*)    All other components within normal limits  URINALYSIS, ROUTINE W REFLEX MICROSCOPIC - Abnormal; Notable for the following components:   APPearance CLOUDY (*)    Hgb urine dipstick MODERATE (*)    Ketones, ur 20 (*)    Protein, ur 100 (*)    Nitrite POSITIVE (*)    Leukocytes, UA LARGE (*)    WBC, UA >50 (*)    Bacteria, UA MANY (*)    All other components within normal limits  MAGNESIUM - Abnormal; Notable for the following components:   Magnesium 2.6 (*)    All other components within normal limits  BASIC METABOLIC PANEL - Abnormal; Notable for the following components:   Glucose, Bld 139 (*)    BUN 5 (*)    Calcium 8.7 (*)    All other components within normal limits  CBC - Abnormal; Notable for the following components:   Hemoglobin 10.5 (*)    HCT 35.5 (*)    MCV 77.3 (*)    MCH 22.9 (*)    MCHC 29.6 (*)    All other components within normal limits  RENAL FUNCTION PANEL - Abnormal; Notable for the following components:   Glucose, Bld 137 (*)    Calcium 8.8 (*)    Albumin 3.4 (*)    All other components within normal limits  CULTURE, BLOOD (ROUTINE X 2)  CULTURE, BLOOD (ROUTINE X 2)  MRSA PCR SCREENING  URINE CULTURE  HIV ANTIBODY (ROUTINE TESTING W REFLEX)  PHOSPHORUS  I-STAT CG4 LACTIC ACID, ED  I-STAT BETA HCG BLOOD, ED ([facility], [facility], [facility] ONLY)  CG4 I-STAT (LACTIC ACID)  I-STAT BETA HCG BLOOD, ED (NOT ORDERABLE)  I-STAT CG4  LACTIC ACID, ED  CG4 I-STAT (LACTIC ACID)    EKG None  Radiology Dg Chest 2 View  Result Date: 02/17/2018 CLINICAL DATA:  Fever, sick x1 day EXAM: CHEST - 2 VIEW COMPARISON:  02/04/2018 FINDINGS: Lungs are clear.  No pleural effusion or pneumothorax. The heart is normal in size. Visualized osseous structures are within normal limits. IMPRESSION: Normal chest radiographs. Electronically Signed   By: Charline BillsSriyesh  Krishnan M.D.   On: 02/17/2018 09:59   Ct Abdomen Pelvis W Contrast  Result Date: 02/17/2018 CLINICAL DATA:  Episode of dysuria 1 week ago. Fever and chest congestion beginning last night. EXAM: CT ABDOMEN AND PELVIS WITH CONTRAST TECHNIQUE: Multidetector CT imaging of the abdomen and pelvis was performed using the standard protocol following bolus administration of intravenous contrast. CONTRAST:  100 mL ISOVUE-300 IOPAMIDOL (ISOVUE-300) INJECTION 61% COMPARISON:  CT abdomen and pelvis 02/05/2018. FINDINGS: Lower chest: Lung bases are clear. No pleural or pericardial effusion. Heart size is normal. Hepatobiliary: No focal liver abnormality is seen. No gallstones, gallbladder wall thickening, or biliary dilatation. Pancreas: Unremarkable. No pancreatic ductal dilatation or surrounding inflammatory changes. Spleen: Normal in size without focal abnormality. Adrenals/Urinary Tract: Patchy areas of decreased cortical enhancement are seen bilaterally, new since the prior exam. There is no hydronephrosis or mass. The urinary bladder is incompletely distended. Its walls are mildly thickened. Stomach/Bowel: Stomach is within normal limits. Appendix appears normal. No evidence of bowel wall thickening, distention, or inflammatory changes. Vascular/Lymphatic: No significant vascular findings are present. No enlarged abdominal or pelvic lymph nodes. Reproductive: Uterus and bilateral adnexa are unremarkable. Other: Small umbilical hernia noted. Musculoskeletal: No acute or focal bony abnormality. IMPRESSION:  Areas of decreased cortical enhancement in both kidneys bilaterally are consistent with pyelonephritis. This finding is new since the prior CT. Mild thickening of the urinary bladder walls is most worrisome for cystitis given the appearance of the kidneys. Small umbilical hernia. Electronically Signed   By: Drusilla Kannerhomas  Dalessio M.D.   On: 02/17/2018 14:34   Ct L-spine No Charge  Result Date: 02/17/2018 CLINICAL DATA:  22 year old female with dysuria and back pain. EXAM: All caps that TECHNIQUE: Technique: Multiplanar CT images of the lumbar spine were reconstructed from contemporary CT of the Abdomen and Pelvis. CONTRAST:  None additional COMPARISON:  CT Abdomen and Pelvis today reported separately. CT Abdomen and Pelvis 02/05/2018, 11/20/2014. FINDINGS: Segmentation: Normal. Alignment: Stable since 2016 with relatively normal lumbar lordosis. Minimal levoconvex lumbar curvature. Vertebrae: No osseous abnormality identified. Negative visible sacrum and SI joints. Paraspinal and other soft tissues: Abdominal and pelvic viscera are reported separately today. Negative visualized posterior paraspinal soft tissues. Disc levels: T12-L1:  Negative. L1-L2:  Negative. L2-L3:  Negative. L3-L4:  Negative. L4-L5:  Mild if any disc bulging. Otherwise negative, no stenosis. L5-S1:  Negative. IMPRESSION: 1. Negative CT appearance of the Lumbar Spine. 2. CT Abdomen and Pelvis today reported separately. Electronically Signed   By: Odessa FlemingH  Hall M.D.   On: 02/17/2018 14:34    Procedures Procedures (including critical care time)  CRITICAL CARE Performed by: Marily MemosJason Corinne Goucher Total critical care time: 35 minutes Critical care time was exclusive of separately billable procedures and treating other patients. Critical care was necessary to treat or prevent imminent or life-threatening deterioration. Critical care was time spent personally by me on the following activities: development of treatment plan with patient and/or  surrogate as  well as nursing, discussions with consultants, evaluation of patient's response to treatment, examination of patient, obtaining history from patient or surrogate, ordering and performing treatments and interventions, ordering and review of laboratory studies, ordering and review of radiographic studies, pulse oximetry and re-evaluation of patient's condition.   Medications Ordered in ED Medications  cefTRIAXone (ROCEPHIN) 2 g in sodium chloride 0.9 % 100 mL IVPB ( Intravenous Stopped 02/18/18 1046)  enoxaparin (LOVENOX) injection 40 mg (40 mg Subcutaneous Given 02/17/18 2309)  0.9 % NaCl with KCl 20 mEq/ L  infusion ( Intravenous Rate/Dose Verify 02/18/18 1200)  morphine 2 MG/ML injection 2 mg (2 mg Intravenous Given 02/18/18 0931)  acetaminophen (TYLENOL) tablet 1,000 mg (1,000 mg Oral Given 02/18/18 1144)  oseltamivir (TAMIFLU) capsule 75 mg (75 mg Oral Given 02/18/18 1014)  zolpidem (AMBIEN) tablet 5 mg (has no administration in time range)  promethazine (PHENERGAN) injection 12.5 mg (12.5 mg Intravenous Given 02/18/18 1142)  acetaminophen (TYLENOL) tablet 650 mg (650 mg Oral Given 02/17/18 1024)  lactated ringers bolus 1,000 mL (1,000 mLs Intravenous New Bag/Given 02/17/18 1429)  iopamidol (ISOVUE-300) 61 % injection 100 mL (100 mLs Intravenous Contrast Given 02/17/18 1400)  iopamidol (ISOVUE-300) 61 % injection (  Contrast Given 02/17/18 1400)  sodium chloride (PF) 0.9 % injection (  Given by Other 02/17/18 1400)  lactated ringers bolus 1,000 mL (1,000 mLs Intravenous New Bag/Given 02/17/18 1552)  ibuprofen (ADVIL,MOTRIN) tablet 400 mg (400 mg Oral Given 02/17/18 1553)  fentaNYL (SUBLIMAZE) injection 50 mcg (50 mcg Intravenous Given 02/17/18 1554)  potassium chloride 10 mEq in 100 mL IVPB (0 mEq Intravenous Stopped 02/18/18 0043)  magnesium sulfate IVPB 2 g 50 mL ( Intravenous Rate/Dose Verify 02/17/18 1722)  lactated ringers bolus 1,000 mL (0 mLs Intravenous Stopped 02/17/18 1806)      Initial Impression / Assessment and Plan / ED Course  I have reviewed the triage vital signs and the nursing notes.  Pertinent labs & imaging results that were available during my care of the patient were reviewed by me and considered in my medical decision making (see chart for details).     Sepsis/pyelonephritis. Fever not improving, HR not improving with abx/fluids. Worry for possible bacteremia? Will admit for continued antibiotics, symptomatic control and management.   Final Clinical Impressions(s) / ED Diagnoses   Final diagnoses:  Back pain  Sepsis, due to unspecified organism, unspecified whether acute organ dysfunction present [facility](HCC)  Pyelonephritis    ED Discharge Orders    None       Keylen Uzelac, Barbara CowerJason, MD 02/18/18 1330

## 2018-02-19 LAB — RENAL FUNCTION PANEL
Albumin: 3.2 g/dL — ABNORMAL LOW (ref 3.5–5.0)
Anion gap: 9 (ref 5–15)
BUN: <5 mg/dL — ABNORMAL LOW (ref 6–20)
CO2: 23 mmol/L (ref 22–32)
Calcium: 8.6 mg/dL — ABNORMAL LOW (ref 8.9–10.3)
Chloride: 107 mmol/L (ref 98–111)
Creatinine, Ser: 0.75 mg/dL (ref 0.44–1.00)
GFR calc Af Amer: >60 mL/min (ref >60)
GFR calc non Af Amer: >60 mL/min (ref >60)
Glucose, Bld: 100 mg/dL — ABNORMAL HIGH (ref 70–99)
Phosphorus: 3.2 mg/dL (ref 2.5–4.6)
Potassium: 3.9 mmol/L (ref 3.5–5.1)
Sodium: 139 mmol/L (ref 135–145)

## 2018-02-19 MED ORDER — IBUPROFEN 400 MG PO TABS
400.0000 mg | ORAL_TABLET | Freq: Four times a day (QID) | ORAL | Status: AC
  Administered 2018-02-19 (×4): 400 mg via ORAL
  Filled 2018-02-19: qty 1
  Filled 2018-02-19: qty 2
  Filled 2018-02-19: qty 1
  Filled 2018-02-19: qty 2

## 2018-02-19 MED ORDER — POTASSIUM CHLORIDE IN NACL 20-0.9 MEQ/L-% IV SOLN
INTRAVENOUS | Status: DC
  Administered 2018-02-19: 50 mL/h via INTRAVENOUS
  Administered 2018-02-20: 08:00:00 via INTRAVENOUS
  Filled 2018-02-19 (×2): qty 1000

## 2018-02-19 MED ORDER — PHENOL 1.4 % MT LIQD
1.0000 | OROMUCOSAL | Status: DC | PRN
  Administered 2018-02-19: 1 via OROMUCOSAL
  Filled 2018-02-19: qty 177

## 2018-02-19 MED ORDER — SALINE SPRAY 0.65 % NA SOLN
1.0000 | NASAL | Status: DC | PRN
  Filled 2018-02-19: qty 44

## 2018-02-19 MED ORDER — LIP MEDEX EX OINT
TOPICAL_OINTMENT | CUTANEOUS | Status: AC
  Administered 2018-02-19: 1
  Filled 2018-02-19: qty 7

## 2018-02-19 NOTE — Progress Notes (Signed)
Pt stable on arrival to floor. No needs at this time. Assessment performed. No s/s of distress or pain. Pt does report dry cough and feeling her heart is racing. Her hr is normal at this time. rn will continue to monitor.

## 2018-02-19 NOTE — Progress Notes (Signed)
PROGRESS NOTE    Anita DarterKarla Robinson  WUJ:[id number] DOB: 1995-06-11 DOA: 02/17/2018 PCP: Patient, No Pcp Per  Outpatient Specialists:   Brief Narrative:  Patient is a 22 year old African-American female with past medical history significant for anemia.  Patient was admitted with urinary symptoms suggestive of UTI/sepsis, fever and back pain.  Urine culture still pending.  Influenza B came back positive.  Hypokalemia noted on presentation, but currently repleted.  Patient continues to have fever.  02/19/2018: Urine cultures growing E. coli.  Sensitivities still pending.  We will continue IV Rocephin for now.  Control few symptoms with ibuprofen for 24 hours and reviewed.  Assessment & Plan:   Active Problems:   Sepsis secondary to UTI [facility](HCC)  UTI/sepsis: Panculture patient. IV antibiotics. Volume resuscitation. Follow final culture results.  Hypokalemia: IV KCl 10 M EQ per hour x6 doses Check magnesium level. Repeat renal panel after completion of IV KCl 02/18/2018: Repleted.  Potassium today is 3.8.  We will continue to monitor. 02/19/2018: Hypokalemia has resolved.    Volume depletion: Continue aggressive hydration 02/18/2018: Improved significantly. 02/19/2017: Resolved significantly.  Influenza B: Trial of Tamiflu. Supportive care (ibuprofen) declined for 24 hours and review).  Continue to monitor renal panel and electrolytes. Further management depend on hospital course.  DVT prophylaxis: Lovenox Code Status: Full Family Communication: None Disposition Plan: Home eventually Consults called: None  Consultants:   None.  Procedures:   None.  Antimicrobials:   IV Rocephin.  Patient is also on antiviral, Tamiflu.   Subjective: Patient is improving.   No fever chills for today.   No shortness of breath.   No chest pain.    Objective: Vitals:   02/19/18 0800 02/19/18 1100 02/19/18 1116 02/19/18 1120  BP: (!) 97/57 (!) 88/50 105/73   Pulse: 92 96  (!) 102   Resp: (!) 24 (!) 24 16   Temp:    99.1 F (37.3 C)  TempSrc:    Oral  SpO2: 99% 99% 100%   Weight:      Height:        Intake/Output Summary (Last 24 hours) at 02/19/2018 1221 Last data filed at 02/19/2018 1100 Gross per 24 hour  Intake 3262.95 ml  Output 550 ml  Net 2712.95 ml   Filed Weights   02/17/18 0941  Weight: 79.4 kg    Examination:  General exam: Appears calm and comfortable.  Not in any distress. Respiratory system: Clear to auscultation. Respiratory effort normal. Cardiovascular system: S1 & S2. No pedal edema. Gastrointestinal system: Abdomen is nondistended, soft and nontender. No organomegaly or masses felt. Normal bowel sounds heard. Central nervous system: Alert and oriented. No focal neurological deficits. Extremities: No leg edema.   Data Reviewed: I have personally reviewed following labs and imaging studies  CBC: Recent Labs  Lab 02/17/18 1040 02/18/18 0113  WBC 7.2 6.4  NEUTROABS 5.8  --   HGB 11.7* 10.5*  HCT 39.0 35.5*  MCV 77.8* 77.3*  PLT 282 247   Basic Metabolic Panel: Recent Labs  Lab 02/17/18 1040 02/17/18 1717 02/18/18 0113 02/19/18 0333  NA 136  --  140  139 139  K 2.8*  --  3.8  3.8 3.9  CL 101  --  108  107 107  CO2 24  --  25  25 23   GLUCOSE 105*  --  137*  139* 100*  BUN 7  --  6  5* <5*  CREATININE 1.00  --  0.77  0.82 0.75  CALCIUM 9.1  --  8.8*  8.7* 8.6*  MG  --  2.6*  --   --   PHOS  --  2.9 2.6 3.2   GFR: Estimated Creatinine Clearance: 113 mL/min (by C-G formula based on SCr of 0.75 mg/dL). Liver Function Tests: Recent Labs  Lab 02/17/18 1040 02/18/18 0113 02/19/18 0333  AST 22  --   --   ALT 15  --   --   ALKPHOS 52  --   --   BILITOT 0.5  --   --   PROT 8.7*  --   --   ALBUMIN 4.3 3.4* 3.2*   No results for input(s): LIPASE, AMYLASE in the last 168 hours. No results for input(s): AMMONIA in the last 168 hours. Coagulation Profile: No results for input(s): INR, PROTIME in  the last 168 hours. Cardiac Enzymes: No results for input(s): CKTOTAL, CKMB, CKMBINDEX, TROPONINI in the last 168 hours. BNP (last 3 results) No results for input(s): PROBNP in the last 8760 hours. HbA1C: No results for input(s): HGBA1C in the last 72 hours. CBG: No results for input(s): GLUCAP in the last 168 hours. Lipid Profile: No results for input(s): CHOL, HDL, LDLCALC, TRIG, CHOLHDL, LDLDIRECT in the last 72 hours. Thyroid Function Tests: No results for input(s): TSH, T4TOTAL, FREET4, T3FREE, THYROIDAB in the last 72 hours. Anemia Panel: No results for input(s): VITAMINB12, FOLATE, FERRITIN, TIBC, IRON, RETICCTPCT in the last 72 hours. Urine analysis:    Component Value Date/Time   COLORURINE YELLOW 02/17/2018 1049   APPEARANCEUR CLOUDY (A) 02/17/2018 1049   LABSPEC 1.015 02/17/2018 1049   PHURINE 6.0 02/17/2018 1049   GLUCOSEU NEGATIVE 02/17/2018 1049   HGBUR MODERATE (A) 02/17/2018 1049   BILIRUBINUR NEGATIVE 02/17/2018 1049   KETONESUR 20 (A) 02/17/2018 1049   PROTEINUR 100 (A) 02/17/2018 1049   UROBILINOGEN 1.0 11/20/2014 1945   NITRITE POSITIVE (A) 02/17/2018 1049   LEUKOCYTESUR LARGE (A) 02/17/2018 1049   Sepsis Labs: @LABRCNTIP (procalcitonin:4,lacticidven:4)  ) Recent Results (from the past 240 hour(s))  Blood Culture (routine x 2)     Status: None (Preliminary result)   Collection Time: 02/17/18 10:48 AM  Result Value Ref Range Status   Specimen Description   Final    BLOOD RIGHT ANTECUBITAL Performed at [facility], [address] [place], [place] [address]    Special Requests   Final    BOTTLES DRAWN AEROBIC AND ANAEROBIC Blood Culture adequate volume Performed at [facility], [address] [place], [place] [address]    Culture   Final    NO GROWTH 2 DAYS Performed at [facility], [address] [place], [place] [address]    Report Status PENDING  Incomplete  Urine culture     Status: Abnormal  (Preliminary result)   Collection Time: 02/17/18 10:49 AM  Result Value Ref Range Status   Specimen Description   Final    URINE, RANDOM Performed at [facility], [address] [place], [place] [address]    Special Requests   Final    NONE Performed at [facility], [address] [place], [place] [address]    Culture >=100,000 COLONIES/mL ESCHERICHIA COLI (A)  Final   Report Status PENDING  Incomplete  Blood Culture (routine x 2)     Status: None (Preliminary result)   Collection Time: 02/17/18 11:03 AM  Result Value Ref Range Status   Specimen Description   Final    BLOOD LEFT ARM Performed at [facility], [address], [place], [place] [address]    Special Requests   Final    BOTTLES DRAWN AEROBIC AND ANAEROBIC Blood Culture results may not be optimal due to an excessive volume of blood received in culture bottles Performed at [facility], [address] [place], [place] [address]    Culture   Final    NO GROWTH 2 DAYS Performed at [facility], [address] [place], [place] [address]    Report Status PENDING  Incomplete  MRSA PCR Screening     Status: None   Collection Time: 02/17/18  5:05 PM  Result Value Ref Range Status   MRSA by PCR NEGATIVE NEGATIVE Final    Comment:        The GeneXpert MRSA Assay (FDA approved for NASAL specimens only), is one component of a comprehensive MRSA colonization surveillance program. It is not intended to diagnose MRSA infection nor to guide or monitor treatment for MRSA infections. Performed at [facility], [address], [place], [place] [address]   Respiratory Panel by PCR     Status: Abnormal   Collection Time: 02/17/18  6:04 PM  Result Value Ref Range Status   Adenovirus NOT DETECTED NOT DETECTED Final   Coronavirus 229E NOT DETECTED NOT DETECTED Final   Coronavirus HKU1 NOT DETECTED NOT DETECTED Final    Coronavirus NL63 NOT DETECTED NOT DETECTED Final   Coronavirus OC43 NOT DETECTED NOT DETECTED Final   Metapneumovirus NOT DETECTED NOT DETECTED Final   Rhinovirus / Enterovirus NOT DETECTED NOT DETECTED Final   Influenza A NOT DETECTED NOT DETECTED Final   Influenza B DETECTED (A) NOT DETECTED Final   Parainfluenza Virus 1 NOT DETECTED NOT DETECTED Final   Parainfluenza Virus 2 NOT DETECTED NOT DETECTED Final   Parainfluenza Virus 3 NOT DETECTED NOT DETECTED Final   Parainfluenza Virus 4 NOT DETECTED NOT DETECTED Final   Respiratory Syncytial Virus NOT DETECTED NOT DETECTED Final   Bordetella pertussis NOT DETECTED NOT DETECTED Final   Chlamydophila pneumoniae NOT DETECTED NOT DETECTED Final   Mycoplasma pneumoniae NOT DETECTED NOT DETECTED Final    Comment: Performed at [facility], [address] [place], [place] [address]         Radiology Studies: Ct Abdomen Pelvis W Contrast  Result Date: 02/17/2018 CLINICAL DATA:  Episode of dysuria 1 week ago. Fever and chest congestion beginning last night. EXAM: CT ABDOMEN AND PELVIS WITH CONTRAST TECHNIQUE: Multidetector CT imaging of the abdomen and pelvis was performed using the standard protocol following bolus administration of intravenous contrast. CONTRAST:  100 mL ISOVUE-300 IOPAMIDOL (ISOVUE-300) INJECTION 61% COMPARISON:  CT abdomen and pelvis 02/05/2018. FINDINGS: Lower chest: Lung bases are clear. No pleural or pericardial effusion. Heart size is normal. Hepatobiliary: No focal liver abnormality is seen. No gallstones, gallbladder wall thickening, or biliary dilatation. Pancreas: Unremarkable. No pancreatic ductal dilatation or surrounding inflammatory changes. Spleen: Normal in size without focal abnormality. Adrenals/Urinary Tract: Patchy areas of decreased cortical enhancement are seen bilaterally, new since the prior exam. There is no hydronephrosis or mass. The urinary bladder is incompletely distended. Its walls are mildly  thickened. Stomach/Bowel: Stomach is within normal limits. Appendix appears normal. No evidence of bowel wall thickening, distention, or inflammatory changes. Vascular/Lymphatic: No significant vascular findings are present. No enlarged abdominal or pelvic lymph nodes. Reproductive: Uterus and bilateral adnexa are unremarkable. Other: Small umbilical hernia noted. Musculoskeletal: No acute or focal bony abnormality. IMPRESSION:  Areas of decreased cortical enhancement in both kidneys bilaterally are consistent with pyelonephritis. This finding is new since the prior CT. Mild thickening of the urinary bladder walls is most worrisome for cystitis given the appearance of the kidneys. Small umbilical hernia. Electronically Signed   By: Drusilla Kanner M.D.   On: 02/17/2018 14:34   Ct L-spine No Charge  Result Date: 02/17/2018 CLINICAL DATA:  22 year old female with dysuria and back pain. EXAM: All caps that TECHNIQUE: Technique: Multiplanar CT images of the lumbar spine were reconstructed from contemporary CT of the Abdomen and Pelvis. CONTRAST:  None additional COMPARISON:  CT Abdomen and Pelvis today reported separately. CT Abdomen and Pelvis 02/05/2018, 11/20/2014. FINDINGS: Segmentation: Normal. Alignment: Stable since 2016 with relatively normal lumbar lordosis. Minimal levoconvex lumbar curvature. Vertebrae: No osseous abnormality identified. Negative visible sacrum and SI joints. Paraspinal and other soft tissues: Abdominal and pelvic viscera are reported separately today. Negative visualized posterior paraspinal soft tissues. Disc levels: T12-L1:  Negative. L1-L2:  Negative. L2-L3:  Negative. L3-L4:  Negative. L4-L5:  Mild if any disc bulging. Otherwise negative, no stenosis. L5-S1:  Negative. IMPRESSION: 1. Negative CT appearance of the Lumbar Spine. 2. CT Abdomen and Pelvis today reported separately. Electronically Signed   By: Odessa Fleming M.D.   On: 02/17/2018 14:34        Scheduled Meds: . lip balm       . enoxaparin (LOVENOX) injection  40 mg Subcutaneous Q24H  . ibuprofen  400 mg Oral QID  . oseltamivir  75 mg Oral BID   Continuous Infusions: . 0.9 % NaCl with KCl 20 mEq / L 100 mL/hr at 02/19/18 1100  . cefTRIAXone (ROCEPHIN)  IV Stopped (02/19/18 1053)     LOS: 2 days    Time spent: 25 minutes.    Berton Mount, MD  [facility] Pager #: [phone] 7PM-7AM contact night coverage as above

## 2018-02-20 LAB — URINE CULTURE: Culture: >=100000 — AB

## 2018-02-20 MED ORDER — OSELTAMIVIR PHOSPHATE 75 MG PO CAPS: 75.0000 mg | ORAL_CAPSULE | Freq: Two times a day (BID) | ORAL | 0 refills | Status: AC

## 2018-02-20 MED ORDER — AMOXICILLIN 500 MG PO CAPS: 500.0000 mg | ORAL_CAPSULE | Freq: Three times a day (TID) | ORAL | 0 refills | Status: AC

## 2018-02-20 MED ORDER — IBUPROFEN 400 MG PO TABS: 400.0000 mg | ORAL_TABLET | Freq: Three times a day (TID) | ORAL | 0 refills | Status: AC | PRN

## 2018-02-20 NOTE — Progress Notes (Signed)
Pt d/c home stable condition with family. No needs at time of d/c.

## 2018-02-20 NOTE — Discharge Instructions (Signed)
To whom it may concern,

## 2018-02-20 NOTE — Discharge Summary (Signed)
Physician Discharge Summary  Patient ID: Anita Robinson MRN: [id number] DOB/AGE: [birth year] 22 y.o.  Admit date: 02/17/2018 Discharge date: 02/20/2018  Admission Diagnoses:  Discharge Diagnoses:  Active Problems:   Sepsis secondary to UTI (HCC) UTI secondary to E. Coli. Volume depletion Influenza type B Hypokalemia   Discharged Condition: stable  Hospital Course:  Patient is a 22 year old African-American female with past medical history significant for anemia.  Patient was admitted with urinary symptoms suggestive of UTI/sepsis, fever and back pain.  Urine culture grew pansensitive E. Coli.  Influenza B came back positive.  Hypokalemia was noted on presentation.  Patient was admitted for further assessment and management.  Patient was pancultured.  Patient was started on IV Rocephin on admission.  Urine culture came back positive for E. coli.  Will discharge patient on 3-day course of amoxicillin.  Patient was also treated with Tamiflu during the hospital stay.  Patient was volume resuscitated.  Hypokalemia was repleted.  He is eager to be discharged back on.  Patient will follow with her primary care provider within 1 week of discharge.  Consults: None  Significant Diagnostic Studies: Urine culture grew pansensitive E. coli.  Patient was positive for influenza type B.  Discharge Exam: Blood pressure 112/82, pulse (!) 102, temperature 98.6 F (37 C), temperature source Oral, resp. rate 16, height 5\' 5"  (1.651 m), weight 79.4 kg, last menstrual period 01/29/2018, SpO2 100 %, currently breastfeeding.   Disposition: Discharge disposition: 01-Home or Self Care   Discharge Instructions    Diet - low sodium heart healthy   Complete by:  As directed    Increase activity slowly   Complete by:  As directed      Allergies as of 02/20/2018      Reactions   Eggs Or Egg-derived Products    Pt says she get bumps       Medication List    STOP taking these medications    aspirin-acetaminophen-caffeine 250-250-65 MG tablet Commonly known as:  EXCEDRIN MIGRAINE   metroNIDAZOLE 500 MG tablet Commonly known as:  FLAGYL   ondansetron 4 MG tablet Commonly known as:  ZOFRAN     TAKE these medications   amoxicillin 500 MG capsule Commonly known as:  AMOXIL Take 1 capsule (500 mg total) by mouth 3 (three) times daily for 3 days.   ibuprofen 400 MG tablet Commonly known as:  MOTRIN IB Take 1 tablet (400 mg total) by mouth every 8 (eight) hours as needed for up to 2 days for fever (Take with food).   oseltamivir 75 MG capsule Commonly known as:  TAMIFLU Take 1 capsule (75 mg total) by mouth 2 (two) times daily for 3 days.        SignedBarnetta Chapel: Coron Rossano I Elmore Hyslop 02/20/2018, 10:31 AM

## 2018-02-20 NOTE — Plan of Care (Signed)
Pt stable to d/c home today. No needs at this time. Md aware of pt fever this am.

## 2018-02-20 NOTE — Care Management Note (Signed)
Case Management Note  Patient Details  Name: [name] MRN: [id number] Date of Birth: Jun 27, 1995  Subjective/Objective:   Flu, UTI                 Action/Plan:  NCM spoke to pt and states she does not have a PCP. Provided pt with contact number for [facility] to call and schedule follow up appointment. Contacted [facility] and her meds are less than $25. Will provide pt with goodrx coupon for Tamiflu.   Expected Discharge Date:  02/20/18               Expected Discharge Plan:  Home/Self Care  In-House Referral:  NA  Discharge planning Services  CM Consult, Medication Assistance, Indigent Health Clinic  Post Acute Care Choice:  NA Choice offered to:  NA  DME Arranged:  N/A DME Agency:  NA  HH Arranged:  NA HH Agency:  NA  Status of Service:  Completed, signed off  If discussed at Long Length of Stay Meetings, dates discussed:    Additional Comments:  Elliot CousinShavis, Kyrianna Barletta Ellen, RN 02/20/2018, 12:36 PM

## 2018-02-21 ENCOUNTER — Telehealth (HOSPITAL_BASED_OUTPATIENT_CLINIC_OR_DEPARTMENT_OTHER): Payer: Self-pay | Admitting: *Deleted

## 2018-02-21 LAB — BLOOD CULTURE ID PANEL (REFLEXED)

## 2018-02-22 LAB — CULTURE, BLOOD (ROUTINE X 2)
Culture: NO GROWTH
Special Requests: ADEQUATE

## 2018-08-13 ENCOUNTER — Inpatient Hospital Stay (HOSPITAL_COMMUNITY)
Admission: EM | Admit: 2018-08-13 | Discharge: 2018-08-14 | Disposition: A | Discharge status: Home / Self Care | Payer: Medicaid Other | Attending: Obstetrics and Gynecology | Admitting: Obstetrics and Gynecology

## 2018-08-13 ENCOUNTER — Encounter (HOSPITAL_COMMUNITY): Payer: Self-pay | Admitting: Emergency Medicine

## 2018-08-13 ENCOUNTER — Other Ambulatory Visit: Payer: Self-pay

## 2018-08-13 DIAGNOSIS — R109 Unspecified abdominal pain: Secondary | ICD-10-CM | POA: Insufficient documentation

## 2018-08-13 DIAGNOSIS — Z3A01 Less than 8 weeks gestation of pregnancy: Secondary | ICD-10-CM | POA: Insufficient documentation

## 2018-08-13 DIAGNOSIS — O26891 Other specified pregnancy related conditions, first trimester: Secondary | ICD-10-CM | POA: Insufficient documentation

## 2018-08-13 DIAGNOSIS — O26899 Other specified pregnancy related conditions, unspecified trimester: Secondary | ICD-10-CM

## 2018-08-13 NOTE — ED Triage Notes (Signed)
Patient here for pregnancy test and for a note saying that she is pregnant.  She states she is having cramping at this time.  No bleeding.  LMP: 07/10/2018.

## 2018-08-14 ENCOUNTER — Other Ambulatory Visit: Payer: Self-pay

## 2018-08-14 ENCOUNTER — Encounter (HOSPITAL_COMMUNITY): Payer: Self-pay

## 2018-08-14 ENCOUNTER — Inpatient Hospital Stay (HOSPITAL_COMMUNITY): Payer: Medicaid Other

## 2018-08-14 DIAGNOSIS — R109 Unspecified abdominal pain: Secondary | ICD-10-CM

## 2018-08-14 DIAGNOSIS — Z3A01 Less than 8 weeks gestation of pregnancy: Secondary | ICD-10-CM | POA: Diagnosis not present

## 2018-08-14 DIAGNOSIS — O26891 Other specified pregnancy related conditions, first trimester: Secondary | ICD-10-CM

## 2018-08-14 LAB — URINALYSIS, ROUTINE W REFLEX MICROSCOPIC
Bilirubin Urine: NEGATIVE
Glucose, UA: NEGATIVE mg/dL
Ketones, ur: NEGATIVE mg/dL
Leukocytes,Ua: NEGATIVE
Nitrite: NEGATIVE
Protein, ur: NEGATIVE mg/dL
Specific Gravity, Urine: 1.017 (ref 1.005–1.030)
pH: 6 (ref 5.0–8.0)

## 2018-08-14 LAB — WET PREP, GENITAL
Clue Cells Wet Prep HPF POC: NONE SEEN
Sperm: NONE SEEN
Trich, Wet Prep: NONE SEEN
Yeast Wet Prep HPF POC: NONE SEEN

## 2018-08-14 LAB — I-STAT BETA HCG BLOOD, ED (MC, WL, AP ONLY): I-stat hCG, quantitative: 1402.8 m[IU]/mL — ABNORMAL HIGH (ref <5)

## 2018-08-14 MED ORDER — ACETAMINOPHEN 500 MG PO TABS
1000.0000 mg | ORAL_TABLET | Freq: Once | ORAL | Status: AC
  Administered 2018-08-14: 1000 mg via ORAL
  Filled 2018-08-14: qty 2

## 2018-08-14 NOTE — Discharge Instructions (Signed)
Subchorionic Hematoma ° °A subchorionic hematoma is a gathering of blood between the outer wall of the embryo (chorion) and the inner wall of the womb (uterus). °This condition can cause vaginal bleeding. If they cause little or no vaginal bleeding, early small hematomas usually shrink on their own and do not affect your baby or pregnancy. When bleeding starts later in pregnancy, or if the hematoma is larger or occurs in older pregnant women, the condition may be more serious. Larger hematomas may get bigger, which increases the chances of miscarriage. This condition also increases the risk of: °· Premature separation of the placenta from the uterus. °· Premature (preterm) labor. °· Stillbirth. °What are the causes? °The exact cause of this condition is not known. It occurs when blood is trapped between the placenta and the uterine wall because the placenta has separated from the original site of implantation. °What increases the risk? °You are more likely to develop this condition if: °· You were treated with fertility medicines. °· You conceived through in vitro fertilization (IVF). °What are the signs or symptoms? °Symptoms of this condition include: °· Vaginal spotting or bleeding. °· Contractions of the uterus. These cause abdominal pain. °Sometimes you may have no symptoms and the bleeding may only be seen when ultrasound images are taken (transvaginal ultrasound). °How is this diagnosed? °This condition is diagnosed based on a physical exam. This includes a pelvic exam. You may also have other tests, including: °· Blood tests. °· Urine tests. °· Ultrasound of the abdomen. °How is this treated? °Treatment for this condition can vary. Treatment may include: °· Watchful waiting. You will be monitored closely for any changes in bleeding. During this stage: °? The hematoma may be reabsorbed by the body. °? The hematoma may separate the fluid-filled space containing the embryo (gestational sac) from the wall of the  womb (endometrium). °· Medicines. °· Activity restriction. This may be needed until the bleeding stops. °Follow these instructions at home: °· Stay on bed rest if told to do so by your health care provider. °· Do not lift anything that is heavier than 10 lbs. (4.5 kg) or as told by your health care provider. °· Do not use any products that contain nicotine or tobacco, such as cigarettes and e-cigarettes. If you need help quitting, ask your health care provider. °· Track and write down the number of pads you use each day and how soaked (saturated) they are. °· Do not use tampons. °· Keep all follow-up visits as told by your health care provider. This is important. Your health care provider may ask you to have follow-up blood tests or ultrasound tests or both. °Contact a health care provider if: °· You have any vaginal bleeding. °· You have a fever. °Get help right away if: °· You have severe cramps in your stomach, back, abdomen, or pelvis. °· You pass large clots or tissue. Save any tissue for your health care provider to look at. °· You have more vaginal bleeding, and you faint or become lightheaded or weak. °Summary °· A subchorionic hematoma is a gathering of blood between the outer wall of the placenta and the uterus. °· This condition can cause vaginal bleeding. °· Sometimes you may have no symptoms and the bleeding may only be seen when ultrasound images are taken. °· Treatment may include watchful waiting, medicines, or activity restriction. °This information is not intended to replace advice given to you by your health care provider. Make sure you discuss any questions you   have with your health care provider. °Document Released: 05/27/2006 Document Revised: 04/07/2016 Document Reviewed: 04/07/2016 °Elsevier Interactive Patient Education © 2019 [employer] ° °

## 2018-08-14 NOTE — MAU Note (Signed)
Presents with lower abd cramping 5/10 since approx 2hrs ago. Pt recently with mild lower abd cramping x 2 days; however has now increased in intensity. No vag bldg.    Gilmer Mor RN

## 2018-08-14 NOTE — MAU Provider Note (Signed)
History     CSN: [id number]  Arrival date and time: 08/13/18 2307   First Provider Initiated Contact with Patient 08/14/18 0221      Chief Complaint  Patient presents with  . Abdominal Cramping   Anita Robinson is a 23 y.o. G2P1001 at 5318w0d by Definite LMP who has not established prenatal care.  She presents today for Abdominal Cramping.  She states the cramping has been present for two days, but "not so severe that I can't handle it."  Patient states the cramping is like period cramps and she denies vaginal bleeding, discharge, or pain.  She rates the pain at a "5 average" and has not taken any medications.  She denies alleviating or aggravating factors for the pain.  She endorses sexual activity in the last 3 days, but denies pain or discomfort.      OB History    Gravida  2   Para  1   Term  1   Preterm  0   AB  0   Living  1     SAB  0   TAB  0   Ectopic  0   Multiple  0   Live Births  1           Past Medical History:  Diagnosis Date  . Anemia    on Iron  . Complication of anesthesia    has never had any type of anesthesia.  . Low iron     Past Surgical History:  Procedure Laterality Date  . NO PAST SURGERIES      Family History  Problem Relation Age of Onset  . Asthma Mother   . Asthma Sister   . Asthma Brother   . Alcohol abuse Neg Hx   . Arthritis Neg Hx   . Birth defects Neg Hx   . Cancer Neg Hx   . COPD Neg Hx   . Depression Neg Hx   . Diabetes Neg Hx   . Drug abuse Neg Hx   . Early death Neg Hx   . Hearing loss Neg Hx   . Heart disease Neg Hx   . Hyperlipidemia Neg Hx   . Hypertension Neg Hx   . Kidney disease Neg Hx   . Learning disabilities Neg Hx   . Mental illness Neg Hx   . Mental retardation Neg Hx   . Miscarriages / Stillbirths Neg Hx   . Stroke Neg Hx   . Vision loss Neg Hx   . Varicose Veins Neg Hx     Social History   Tobacco Use  . Smoking status: Never Smoker  . Smokeless tobacco: Never Used  Substance  Use Topics  . Alcohol use: No  . Drug use: No    Allergies:  Allergies  Allergen Reactions  . Eggs Or Egg-Derived Products     Pt says she get bumps     No medications prior to admission.    Review of Systems  Constitutional: Negative for chills and fever.  Respiratory: Negative for cough and shortness of breath.   Gastrointestinal: Positive for abdominal pain and nausea. Negative for constipation, diarrhea and vomiting.  Genitourinary: Negative for difficulty urinating, dysuria, vaginal bleeding and vaginal discharge.  Neurological: Negative for dizziness, light-headedness and headaches.   Physical Exam   Blood pressure 113/68, pulse 81, temperature 99 F (37.2 C), temperature source Oral, resp. rate 14, last menstrual period 07/10/2018, SpO2 100 %, currently breastfeeding.  Physical Exam  Constitutional: She is  oriented to person, place, and time. She appears well-developed and well-nourished.  HENT:  Head: Normocephalic and atraumatic.  Eyes: Conjunctivae are normal.  Neck: Normal range of motion.  Cardiovascular: Normal rate, regular rhythm and normal heart sounds.  Respiratory: Effort normal and breath sounds normal.  GI: Soft. There is abdominal tenderness in the right lower quadrant. There is no rigidity and no guarding.  Genitourinary: Cervix exhibits no motion tenderness and no discharge.    Vaginal discharge present.     No vaginal bleeding.  No bleeding in the vagina.    Genitourinary Comments: Speculum Exam: -Vaginal Vault: Pink mucosa.  Scant amt white discharge in posterior fornix -wet prep collected -Cervix:Pink, no lesions, cysts, or polyps.  Appears closed. No active bleeding from os-GC/CT collected -Bimanual Exam: Closed Uterus retroverted   Neurological: She is alert and oriented to person, place, and time.  Skin: Skin is warm and dry.    MAU Course  Procedures Results for orders placed or performed during the hospital encounter of 08/13/18 (from  the past 24 hour(s))  I-Stat beta hCG blood, ED     Status: Abnormal   Collection Time: 08/14/18 12:09 AM  Result Value Ref Range   I-stat hCG, quantitative 1,402.8 (H) <5 mIU/mL   Comment 3          Urinalysis, Routine w reflex microscopic     Status: Abnormal   Collection Time: 08/14/18  1:57 AM  Result Value Ref Range   Color, Urine YELLOW YELLOW   APPearance CLEAR CLEAR   Specific Gravity, Urine 1.017 1.005 - 1.030   pH 6.0 5.0 - 8.0   Glucose, UA NEGATIVE NEGATIVE mg/dL   Hgb urine dipstick SMALL (A) NEGATIVE   Bilirubin Urine NEGATIVE NEGATIVE   Ketones, ur NEGATIVE NEGATIVE mg/dL   Protein, ur NEGATIVE NEGATIVE mg/dL   Nitrite NEGATIVE NEGATIVE   Leukocytes,Ua NEGATIVE NEGATIVE   RBC / HPF 0-5 0 - 5 RBC/hpf   WBC, UA 0-5 0 - 5 WBC/hpf   Bacteria, UA RARE (A) NONE SEEN   Squamous Epithelial / LPF 0-5 0 - 5   Mucus PRESENT   Wet prep, genital     Status: Abnormal   Collection Time: 08/14/18  2:32 AM  Result Value Ref Range   Yeast Wet Prep HPF POC NONE SEEN NONE SEEN   Trich, Wet Prep NONE SEEN NONE SEEN   Clue Cells Wet Prep HPF POC NONE SEEN NONE SEEN   WBC, Wet Prep HPF POC MANY (A) NONE SEEN   Sperm NONE SEEN    US Ob Less Than 14 Weeks With Ob Transvaginal  Result Date: 08/14/2018 CLINICAL DATA:  Pregnant patient with pelvic cramping. Quantitative HCG 1402.8 EXAM: OBSTETRIC <14 WK [place] AND TRANSVAGINAL OB US TECHNIQUE: Both transabdominal and transvaginal ultrasound examinations were performed for complete evaluation of the gestation as well as the maternal uterus, adnexal regions, and pelvic cul-de-sac. Transvaginal technique was performed to assess early pregnancy. COMPARISON:  None. FINDINGS: Intrauterine gestational sac: Visualized. Yolk sac:  Not visualized. Embryo:  Not visualized. Cardiac Activity: Not applicable. MSD: 3.4 mm   5 w   0 d Subchorionic hemorrhage:  Small subchorionic hemorrhage is seen. Maternal uterus/adnexae: Appear normal. IMPRESSION: Probable  early intrauterine gestational sac, but no yolk sac, fetal pole, or cardiac activity yet visualized. Recommend follow-up quantitative B-HCG levels and follow-up US in 14 days to assess viability. This recommendation follows SRU consensus guidelines: Diagnostic Criteria for Nonviable Pregnancy Early in the First Trimester. N  Dola Factorngl J Med 2013; 369:1443-51. Small subchorionic hemorrhage. Electronically Signed   By: Drusilla Kannerhomas  Dalessio M.D.   On: 08/14/2018 03:28    MDM PE Labs; Wet prep, GC/CT TVUS Assessment and Plan  23 year old  G2P1001 at 5.0 weeks Pregnancy of Unknown Location Abdominal Pain   -Exam findings discussed. -Offered and accepts pain medication. -Tylenol XR given.  -Cultures collected. -Will send for US  [facility]:37 [facility]  -US findings discussed. -Informed of need for repeat quant in 48 hours. -Follow up scheduled for [facility] on Tuesday at 2pm -[facility] -No questions or concerns. -Encouraged to call or return to MAU if symptoms worsen or with the onset of new symptoms. -Discharged to home in stable condition  Anita RobinsJessica L Stewart Sasaki MSN, CNM 08/14/2018, 2:21 AM

## 2018-08-14 NOTE — MAU Note (Signed)
Pt not in lobby.  

## 2018-08-15 LAB — GC/CHLAMYDIA PROBE AMP (~~LOC~~) NOT AT ARMC
Chlamydia: NEGATIVE
Neisseria Gonorrhea: NEGATIVE

## 2018-08-16 ENCOUNTER — Other Ambulatory Visit: Payer: Self-pay

## 2018-08-16 ENCOUNTER — Telehealth: Payer: Self-pay | Admitting: General Practice

## 2018-08-16 ENCOUNTER — Ambulatory Visit (INDEPENDENT_AMBULATORY_CARE_PROVIDER_SITE_OTHER): Payer: Self-pay | Admitting: General Practice

## 2018-08-16 DIAGNOSIS — O3680X0 Pregnancy with inconclusive fetal viability, not applicable or unspecified: Secondary | ICD-10-CM

## 2018-08-16 LAB — BETA HCG QUANT (REF LAB): hCG Quant: 4218 m[IU]/mL

## 2018-08-16 NOTE — Telephone Encounter (Signed)
Called patient with bhcg results, ultrasound appt, & reviewed ectopic precautions. Patient verbalized understanding & had no questions.

## 2018-08-16 NOTE — Progress Notes (Signed)
Patient presents to office today for stat bhcg following recent MAU visit on 6/21. Patient reports continued cramping like a period but the pain is less than what it was in MAU. Patient denies bleeding. Discussed with patient we are monitoring your bhcg levels today, when they come back we will review the results and your history with a provider and call you with results/updated plan of care. Patient verbalized understanding to all & provided call back number [phone].  Reviewed results with Kerry Hough, who finds appropriate rise in bhcg levels- patient should have follow up ultrasound in 7-10 days. Scheduled 7/2 @ 1pm. Will call patient with results.   Koren Bound RN BSN 08/16/18

## 2018-08-17 ENCOUNTER — Telehealth: Payer: Self-pay | Admitting: Lactation Services

## 2018-08-17 DIAGNOSIS — O3680X0 Pregnancy with inconclusive fetal viability, not applicable or unspecified: Secondary | ICD-10-CM

## 2018-08-17 NOTE — Telephone Encounter (Signed)
Pt called in wanting to know when her appt. Is scheduled. Informed her it is July 2 at 1 pm. Pt voiced understanding.

## 2018-08-17 NOTE — Progress Notes (Signed)
I have reviewed the chart and agree with nursing staff's documentation of this patient's encounter.  Kerry Hough, PA-C 08/17/2018 8:20 AM

## 2018-08-25 ENCOUNTER — Ambulatory Visit (INDEPENDENT_AMBULATORY_CARE_PROVIDER_SITE_OTHER): Payer: Medicaid Other | Admitting: General Practice

## 2018-08-25 ENCOUNTER — Other Ambulatory Visit: Payer: Self-pay

## 2018-08-25 ENCOUNTER — Ambulatory Visit (HOSPITAL_COMMUNITY)
Admission: RE | Admit: 2018-08-25 | Discharge: 2018-08-25 | Disposition: A | Discharge status: Home / Self Care | Payer: Medicaid Other | Source: Ambulatory Visit | Attending: Medical | Admitting: Medical

## 2018-08-25 DIAGNOSIS — O3680X0 Pregnancy with inconclusive fetal viability, not applicable or unspecified: Secondary | ICD-10-CM | POA: Insufficient documentation

## 2018-08-25 DIAGNOSIS — Z712 Person consulting for explanation of examination or test findings: Secondary | ICD-10-CM

## 2018-08-25 NOTE — Progress Notes (Signed)
Patient presents to office today for viability ultrasound results. Reviewed results with Dr Ilda Basset who finds single living IUP, patient should begin prenatal care.   Informed patient of results & reviewed dating. patient verbalized understanding & reports she already has new OB appt scheduled with [facility] on 7/30. Patient had no questions.  Koren Bound RN BSN 08/25/18

## 2018-09-07 NOTE — Progress Notes (Signed)
Patient seen and assessed by nursing staff during this encounter. I have reviewed the chart and agree with the documentation and plan.  Tyreque Finken, MD 09/07/2018 2:33 PM    

## 2018-09-22 ENCOUNTER — Other Ambulatory Visit: Payer: Self-pay | Admitting: Obstetrics & Gynecology

## 2018-09-22 NOTE — Progress Notes (Signed)
UTR pt to complete Pre op phone call, arrange a lab appointment or give instructions on covid screening for the morning of surgery. Spoke with Adrianne at [employer] office, she is also unable to reach pt.

## 2018-09-23 ENCOUNTER — Ambulatory Visit (HOSPITAL_BASED_OUTPATIENT_CLINIC_OR_DEPARTMENT_OTHER)
Admission: RE | Admit: 2018-09-23 | Payer: Medicaid Other | Source: Home / Self Care | Admitting: Obstetrics & Gynecology

## 2018-09-23 ENCOUNTER — Encounter (HOSPITAL_BASED_OUTPATIENT_CLINIC_OR_DEPARTMENT_OTHER): Payer: Self-pay | Admitting: Anesthesiology

## 2018-09-23 ENCOUNTER — Encounter (HOSPITAL_BASED_OUTPATIENT_CLINIC_OR_DEPARTMENT_OTHER): Admission: RE | Payer: Self-pay | Source: Home / Self Care

## 2018-09-23 SURGERY — DILATION AND EVACUATION, UTERUS
Anesthesia: Choice

## 2018-12-15 ENCOUNTER — Other Ambulatory Visit: Payer: Self-pay

## 2018-12-15 DIAGNOSIS — K029 Dental caries, unspecified: Secondary | ICD-10-CM | POA: Insufficient documentation

## 2018-12-15 DIAGNOSIS — K0889 Other specified disorders of teeth and supporting structures: Secondary | ICD-10-CM | POA: Diagnosis present

## 2018-12-15 DIAGNOSIS — Z79899 Other long term (current) drug therapy: Secondary | ICD-10-CM | POA: Diagnosis not present

## 2018-12-16 ENCOUNTER — Other Ambulatory Visit: Payer: Self-pay

## 2018-12-16 ENCOUNTER — Encounter (HOSPITAL_COMMUNITY): Payer: Self-pay | Admitting: Emergency Medicine

## 2018-12-16 ENCOUNTER — Emergency Department (HOSPITAL_COMMUNITY)
Admission: EM | Admit: 2018-12-16 | Discharge: 2018-12-16 | Disposition: A | Discharge status: Home / Self Care | Payer: Medicaid Other | Attending: Emergency Medicine | Admitting: Emergency Medicine

## 2018-12-16 DIAGNOSIS — K029 Dental caries, unspecified: Secondary | ICD-10-CM

## 2018-12-16 MED ORDER — PENICILLIN V POTASSIUM 500 MG PO TABS
500.0000 mg | ORAL_TABLET | Freq: Once | ORAL | Status: AC
  Administered 2018-12-16: 01:00:00 500 mg via ORAL
  Filled 2018-12-16: qty 1

## 2018-12-16 MED ORDER — PENICILLIN V POTASSIUM 500 MG PO TABS: 500.0000 mg | ORAL_TABLET | Freq: Four times a day (QID) | ORAL | 0 refills | Status: AC

## 2018-12-16 MED ORDER — IBUPROFEN 800 MG PO TABS
800.0000 mg | ORAL_TABLET | Freq: Once | ORAL | Status: AC
  Administered 2018-12-16: 01:00:00 800 mg via ORAL
  Filled 2018-12-16: qty 1

## 2018-12-16 MED ORDER — IBUPROFEN 800 MG PO TABS: 800.0000 mg | ORAL_TABLET | Freq: Three times a day (TID) | ORAL | 0 refills | Status: DC

## 2018-12-16 MED ORDER — ACETAMINOPHEN 500 MG PO TABS
1000.0000 mg | ORAL_TABLET | Freq: Once | ORAL | Status: AC
  Administered 2018-12-16: 01:00:00 1000 mg via ORAL
  Filled 2018-12-16: qty 2

## 2018-12-16 NOTE — ED Triage Notes (Signed)
Patient complaining of right bottom tooth pain. Patient states she suppose to be getting it pulled but is in severe pain. Patient states it started two days ago.

## 2018-12-16 NOTE — ED Provider Notes (Signed)
[facility]   CSN: [id number] Arrival date & time: 12/15/18  2350     History   Chief Complaint Chief Complaint  Patient presents with  . Dental Pain    HPI Anita Robinson is a 23 y.o. female.     The history is provided by the patient.  Dental Pain Location:  Lower Lower teeth location:  18/LL 2nd molar Quality:  Aching and shooting Onset quality:  Gradual Timing:  Constant Progression:  Unchanged Chronicity:  New Context: dental fracture and poor dentition   Previous work-up:  Dental exam and filled cavity Relieved by:  Nothing Worsened by:  Nothing Ineffective treatments:  None tried Associated symptoms: no congestion, no difficulty swallowing, no drooling, no facial pain, no facial swelling, no fever, no gum swelling, no headaches, no neck pain, no neck swelling, no oral bleeding, no oral lesions and no trismus   Risk factors: no alcohol problem     Past Medical History:  Diagnosis Date  . Anemia    on Iron  . Complication of anesthesia    has never had any type of anesthesia.  . Low iron     Patient Active Problem List   Diagnosis Date Noted  . Sepsis secondary to UTI (HCC) 02/17/2018  . TOA (tubo-ovarian abscess) 11/21/2014  . Trichomoniasis 08/08/2013    Past Surgical History:  Procedure Laterality Date  . NO PAST SURGERIES       OB History    Gravida  2   Para  1   Term  1   Preterm  0   AB  0   Living  1     SAB  0   TAB  0   Ectopic  0   Multiple  0   Live Births  1            Home Medications    Prior to Admission medications   Not on File    Family History Family History  Problem Relation Age of Onset  . Asthma Mother   . Asthma Sister   . Asthma Brother   . Alcohol abuse Neg Hx   . Arthritis Neg Hx   . Birth defects Neg Hx   . Cancer Neg Hx   . COPD Neg Hx   . Depression Neg Hx   . Diabetes Neg Hx   . Drug abuse Neg Hx   . Early death Neg Hx   .  Hearing loss Neg Hx   . Heart disease Neg Hx   . Hyperlipidemia Neg Hx   . Hypertension Neg Hx   . Kidney disease Neg Hx   . Learning disabilities Neg Hx   . Mental illness Neg Hx   . Mental retardation Neg Hx   . Miscarriages / Stillbirths Neg Hx   . Stroke Neg Hx   . Vision loss Neg Hx   . Varicose Veins Neg Hx     Social History Social History   Tobacco Use  . Smoking status: Never Smoker  . Smokeless tobacco: Never Used  Substance Use Topics  . Alcohol use: No  . Drug use: No     Allergies   Eggs or egg-derived products   Review of Systems Review of Systems  Constitutional: Negative for fever.  HENT: Negative for congestion, drooling, facial swelling and mouth sores.   Eyes: Negative for visual disturbance.  Respiratory: Negative for shortness of breath.   Cardiovascular: Negative for chest  pain.  Gastrointestinal: Negative for abdominal distention.  Genitourinary: Negative for difficulty urinating.  Musculoskeletal: Negative for arthralgias and neck pain.  Neurological: Negative for headaches.  Psychiatric/Behavioral: Negative for agitation.  All other systems reviewed and are negative.    Physical Exam Updated Vital Signs BP (!) 136/95 (BP Location: Left Arm)   Pulse 78   Temp 98.5 F (36.9 C) (Oral)   Resp 16   Ht 5\' 5"  (1.651 m)   Wt 80.7 kg   LMP 07/10/2018 (Exact Date)   SpO2 99%   BMI 29.62 kg/m   Physical Exam Vitals signs and nursing note reviewed.  Constitutional:      Appearance: She is normal weight.  HENT:     Head: Normocephalic and atraumatic.     Nose: Nose normal.     Mouth/Throat:     Dentition: Abnormal dentition.  Eyes:     Conjunctiva/sclera: Conjunctivae normal.     Pupils: Pupils are equal, round, and reactive to light.  Cardiovascular:     Rate and Rhythm: Normal rate and regular rhythm.     Pulses: Normal pulses.     Heart sounds: Normal heart sounds.  Pulmonary:     Effort: Pulmonary effort is normal.      Breath sounds: Normal breath sounds.  Abdominal:     General: Abdomen is flat. Bowel sounds are normal.     Tenderness: There is no abdominal tenderness. There is no guarding.  Musculoskeletal: Normal range of motion.  Skin:    General: Skin is warm and dry.     Capillary Refill: Capillary refill takes less than 2 seconds.  Neurological:     General: No focal deficit present.     Mental Status: She is alert.  Psychiatric:        Mood and Affect: Mood normal.        Behavior: Behavior normal.      ED Treatments / Results  Labs (all labs ordered are listed, but only abnormal results are displayed) Labs Reviewed - No data to display  EKG None  Radiology No results found.  Procedures Procedures (including critical care time)  Medications Ordered in ED Medications  penicillin v potassium (VEETID) tablet 500 mg (has no administration in time range)  ibuprofen (ADVIL) tablet 800 mg (has no administration in time range)  acetaminophen (TYLENOL) tablet 1,000 mg (has no administration in time range)     Dental caries. No swelling.  Needs Xray and dental exam and treatment by a dentist.   Anita Robinson was evaluated in Emergency Department on 12/16/2018 for the symptoms described in the history of present illness. She was evaluated in the context of the global COVID-19 pandemic, which necessitated consideration that the patient might be at risk for infection with the SARS-CoV-2 virus that causes COVID-19. Institutional protocols and algorithms that pertain to the evaluation of patients at risk for COVID-19 are in a state of rapid change based on information released by regulatory bodies including the CDC and federal and state organizations. These policies and algorithms were followed during the patient's care in the ED.   Final Clinical Impressions(s) / ED Diagnoses   Return for weakness, numbness, changes in vision or speech, fevers >100.4 unrelieved by medication, shortness  of breath, intractable vomiting, or diarrhea, abdominal pain, Inability to tolerate liquids or food, cough, altered mental status or any concerns. No signs of systemic illness or infection. The patient is nontoxic-appearing on exam and vital signs are within normal limits.  I have reviewed the triage vital signs and the nursing notes. Pertinent labs &imaging results that were available during my care of the patient were reviewed by me and considered in my medical decision making (see chart for details).  After history, exam, and medical workup I feel the patient has been appropriately medically screened and is safe for discharge home. Pertinent diagnoses were discussed with the patient. Patient was given return precautions      Ervie Mccard, MD 12/16/18 0020

## 2018-12-26 ENCOUNTER — Emergency Department (HOSPITAL_COMMUNITY)
Admission: EM | Admit: 2018-12-26 | Discharge: 2018-12-27 | Disposition: A | Discharge status: Home / Self Care | Payer: Medicaid Other | Attending: Emergency Medicine | Admitting: Emergency Medicine

## 2018-12-26 ENCOUNTER — Other Ambulatory Visit: Payer: Self-pay

## 2018-12-26 DIAGNOSIS — K0889 Other specified disorders of teeth and supporting structures: Secondary | ICD-10-CM | POA: Diagnosis not present

## 2018-12-26 NOTE — ED Triage Notes (Signed)
Pt complains of dental pain in her right back molar, she has an appt at the end of the month, not currently  On antibiotics and the toothache is giving her a headache

## 2018-12-27 ENCOUNTER — Encounter: Payer: Self-pay | Admitting: Emergency Medicine

## 2018-12-27 MED ORDER — KETOROLAC TROMETHAMINE 60 MG/2ML IM SOLN
60.0000 mg | Freq: Once | INTRAMUSCULAR | Status: DC
  Filled 2018-12-27: qty 2

## 2018-12-27 MED ORDER — KETOROLAC TROMETHAMINE 60 MG/2ML IM SOLN
60.0000 mg | Freq: Once | INTRAMUSCULAR | Status: AC
  Administered 2018-12-27: 60 mg via INTRAMUSCULAR

## 2018-12-27 MED ORDER — NAPROXEN 500 MG PO TABS: 500.0000 mg | ORAL_TABLET | Freq: Two times a day (BID) | ORAL | 0 refills | Status: DC | PRN

## 2018-12-27 MED ORDER — AMOXICILLIN 500 MG PO CAPS: 500.0000 mg | ORAL_CAPSULE | Freq: Three times a day (TID) | ORAL | 0 refills | Status: DC

## 2018-12-27 NOTE — ED Provider Notes (Signed)
[facility] [place] [facility] [place] [facility] Provider Note   CSN: [id number] Arrival date & time: 12/26/18  2338     History   Chief Complaint No chief complaint on file.   HPI Anita Robinson is a 23 y.o. female.     Patient presents to the emergency department for evaluation of toothache.  Patient reports that this has been ongoing for some time.  She reports that she has a dental appointment at the end of the month but has been having severe pain for the last 2 days.  No fever or facial swelling.     Past Medical History:  Diagnosis Date  . Anemia    on Iron  . Complication of anesthesia    has never had any type of anesthesia.  . Low iron     Patient Active Problem List   Diagnosis Date Noted  . Sepsis secondary to UTI (HCC) 02/17/2018  . TOA (tubo-ovarian abscess) 11/21/2014  . Trichomoniasis 08/08/2013    Past Surgical History:  Procedure Laterality Date  . NO PAST SURGERIES       OB History    Gravida  2   Para  1   Term  1   Preterm  0   AB  0   Living  1     SAB  0   TAB  0   Ectopic  0   Multiple  0   Live Births  1            Home Medications    Prior to Admission medications   Medication Sig Start Date End Date Taking? Authorizing Provider  amoxicillin (AMOXIL) 500 MG capsule Take 1 capsule (500 mg total) by mouth 3 (three) times daily. 12/27/18   Gilda Crease, MD  naproxen (NAPROSYN) 500 MG tablet Take 1 tablet (500 mg total) by mouth 2 (two) times daily as needed for moderate pain. 12/27/18   Gilda Crease, MD    Family History Family History  Problem Relation Age of Onset  . Asthma Mother   . Asthma Sister   . Asthma Brother   . Alcohol abuse Neg Hx   . Arthritis Neg Hx   . Birth defects Neg Hx   . Cancer Neg Hx   . COPD Neg Hx   . Depression Neg Hx   . Diabetes Neg Hx   . Drug abuse Neg Hx   . Early death Neg Hx   . Hearing loss Neg Hx   . Heart disease Neg Hx   . Hyperlipidemia Neg  Hx   . Hypertension Neg Hx   . Kidney disease Neg Hx   . Learning disabilities Neg Hx   . Mental illness Neg Hx   . Mental retardation Neg Hx   . Miscarriages / Stillbirths Neg Hx   . Stroke Neg Hx   . Vision loss Neg Hx   . Varicose Veins Neg Hx     Social History Social History   Tobacco Use  . Smoking status: Never Smoker  . Smokeless tobacco: Never Used  Substance Use Topics  . Alcohol use: No  . Drug use: No     Allergies   Eggs or egg-derived products   Review of Systems Review of Systems  HENT: Positive for dental problem.   All other systems reviewed and are negative.    Physical Exam Updated Vital Signs BP (!) 132/93 (BP Location: Right Arm)   Pulse 82   Temp 98.7 F (  37.1 C) (Oral)   Resp 18   Ht 5\' 5"  (1.651 m)   Wt 80.7 kg   LMP 12/13/2018   SpO2 100%   Breastfeeding Unknown   BMI 29.62 kg/m   Physical Exam Vitals signs and nursing note reviewed.  Constitutional:      General: She is not in acute distress.    Appearance: Normal appearance. She is well-developed.  HENT:     Head: Normocephalic and atraumatic.     Right Ear: Hearing normal.     Left Ear: Hearing normal.     Nose: Nose normal.     Mouth/Throat:     Dentition: Dental tenderness and dental caries present. No dental abscesses.   Eyes:     Conjunctiva/sclera: Conjunctivae normal.     Pupils: Pupils are equal, round, and reactive to light.  Neck:     Musculoskeletal: Normal range of motion and neck supple.  Cardiovascular:     Rate and Rhythm: Regular rhythm.     Heart sounds: S1 normal and S2 normal. No murmur. No friction rub. No gallop.   Pulmonary:     Effort: Pulmonary effort is normal. No respiratory distress.     Breath sounds: Normal breath sounds.  Chest:     Chest wall: No tenderness.  Abdominal:     General: Bowel sounds are normal.     Palpations: Abdomen is soft.     Tenderness: There is no abdominal tenderness. There is no guarding or rebound. Negative  signs include Murphy's sign and McBurney's sign.     Hernia: No hernia is present.  Musculoskeletal: Normal range of motion.  Skin:    General: Skin is warm and dry.     Findings: No rash.  Neurological:     Mental Status: She is alert and oriented to person, place, and time.     GCS: GCS eye subscore is 4. GCS verbal subscore is 5. GCS motor subscore is 6.     Cranial Nerves: No cranial nerve deficit.     Sensory: No sensory deficit.     Coordination: Coordination normal.  Psychiatric:        Speech: Speech normal.        Behavior: Behavior normal.        Thought Content: Thought content normal.      ED Treatments / Results  Labs (all labs ordered are listed, but only abnormal results are displayed) Labs Reviewed - No data to display  EKG None  Radiology No results found.  Procedures Procedures (including critical care time)  Medications Ordered in ED Medications  ketorolac (TORADOL) injection 60 mg (has no administration in time range)     Initial Impression / Assessment and Plan / ED Course  I have reviewed the triage vital signs and the nursing notes.  Pertinent labs & imaging results that were available during my care of the patient were reviewed by me and considered in my medical decision making (see chart for details).        Patient presents with severe dental pain, no evidence of abscess.  She is reporting that the pain is causing her to have a headache.  She has normal neurologic function, no warning signs or red flags with the headache.  Final Clinical Impressions(s) / ED Diagnoses   Final diagnoses:  Pain, dental    ED Discharge Orders         Ordered    amoxicillin (AMOXIL) 500 MG capsule  3 times daily  12/27/18 0007    naproxen (NAPROSYN) 500 MG tablet  2 times daily PRN     12/27/18 0007           Orpah Greek, MD 12/27/18 0023

## 2019-02-24 NOTE — L&D Delivery Note (Deleted)
Cesarean Section Procedure Note  Indications: abruptio placenta and non-reassuring fetal status  Pre-operative Diagnosis: 28 week 1 day pregnancy.  Post-operative Diagnosis: same  Surgeon: Steva Ready   Assistants: Dorisann Frames, CNM  Anesthesia: General endotracheal anesthesia  Indications:  24 y.o. G3P1011 admitted on 10/10 at [redacted]w[redacted]d for PPROM.  Patient began to experience heavy bright red vaginal bleeding, contractions, and abdominal pain.  Cervix was unchanged from admission wand was 1/50/high.  FHT with variable decelerations.  Decision was made to proceed with cesarean section due to concern for placental abruption.  Procedure Details  The patient was seen in the Holding Room. The risks, benefits, complications, treatment options, and expected outcomes were discussed with the patient.  The patient concurred with the proposed plan, giving informed consent.  The site of surgery properly noted/marked. The patient was taken to Operating Room, identified as Anita Robinson and the procedure verified as C-Section Delivery. A Time Out was held and the above information confirmed.  After induction of anesthesia, the patient was draped and prepped in the usual sterile manner. A Pfannenstiel incision was made and carried down through the subcutaneous tissue to the fascia. Fascial incision was made and extended bluntly.  The peritoneum was identified and entered. Peritoneal incision was extended longitudinally. A low transverse uterine incision was made. Delivered from cephalic presentation was a 1020 gram Female with Apgar scores of 2 at one minute and 7 at five minutes. After the umbilical cord was clamped and cut cord blood was obtained for evaluation. The placenta was removed intact and there was a ~30% abruption noted.. The uterine outline, tubes and ovaries appeared normal with the exception of filmy adhesions over the tubes and ovaries. The uterine incision was closed with running locked  sutures of 0-Monocryl.  A second imbricating layer was used to close the uterus using 0-Monocryl. Hemostasis was observed. Lavage was carried out until clear.  The peritoneum was closed using 2-0 Vicryl. The fascia was then reapproximated with running sutures of 0-PDS.  The subcutaneous tissues were irrigated and closed using 3-0 Monocryl.  The skin was reapproximated with 4-0 Vicryl.  Instrument, sponge, and needle counts were correct prior the abdominal closure and at the conclusion of the case.   Findings: Normal-appearing uterus and bilateral fallopian tubes and ovaries.  Filmy adhesions over fallopian tubes and ovaries.  Fetus in cephalic position.  A ~30% placental abruption was noted.  Estimated Blood Loss:  198cc         Drains: Foley         Total IV Fluids:          Specimens: Placenta and Disposition:  Sent to Pathology          Implants: None         Complications:  None; patient tolerated the procedure well.         Disposition: PACU - hemodynamically stable.         Condition: stable  Steva Ready, DO

## 2019-04-30 ENCOUNTER — Emergency Department (HOSPITAL_COMMUNITY)
Admission: EM | Admit: 2019-04-30 | Discharge: 2019-04-30 | Disposition: A | Discharge status: Home / Self Care | Payer: Medicaid Other | Attending: Emergency Medicine | Admitting: Emergency Medicine

## 2019-04-30 ENCOUNTER — Other Ambulatory Visit: Payer: Self-pay

## 2019-04-30 ENCOUNTER — Encounter (HOSPITAL_COMMUNITY): Payer: Self-pay | Admitting: Emergency Medicine

## 2019-04-30 DIAGNOSIS — Z3202 Encounter for pregnancy test, result negative: Secondary | ICD-10-CM | POA: Diagnosis not present

## 2019-04-30 DIAGNOSIS — Z79899 Other long term (current) drug therapy: Secondary | ICD-10-CM | POA: Insufficient documentation

## 2019-04-30 LAB — POC URINE PREG, ED: Preg Test, Ur: NEGATIVE

## 2019-04-30 NOTE — ED Provider Notes (Signed)
[facility] Provider Note   CSN: [id number] Arrival date & time: 04/30/19  1407     History Chief Complaint  Patient presents with  . Possible Pregnancy    Anita Robinson is a 24 y.o. female with history significant for anemia, TOA who presents for evaluation of needing pregnancy test.  LMP 3 weeks ago.  Patient states she had 1+ home pregnancy test and 3 - test.  She is a G2 P1.  She denies any lightheadedness, dizziness, abdominal pain, diarrhea, dysuria, vaginal bleeding, cramping, concern for STDs.  Denies additional aggravating or alleviating factors.  History obtained from patient and past medical records.  No interpreter is used.  HPI     Past Medical History:  Diagnosis Date  . Anemia    on Iron  . Complication of anesthesia    has never had any type of anesthesia.  . Low iron     Patient Active Problem List   Diagnosis Date Noted  . Sepsis secondary to UTI ([place]) 02/17/2018  . TOA (tubo-ovarian abscess) 11/21/2014  . Trichomoniasis 08/08/2013    Past Surgical History:  Procedure Laterality Date  . NO PAST SURGERIES       OB History    Gravida  2   Para  1   Term  1   Preterm  0   AB  0   Living  1     SAB  0   TAB  0   Ectopic  0   Multiple  0   Live Births  1           Family History  Problem Relation Age of Onset  . Asthma Mother   . Asthma Sister   . Asthma Brother   . Alcohol abuse Neg Hx   . Arthritis Neg Hx   . Birth defects Neg Hx   . Cancer Neg Hx   . COPD Neg Hx   . Depression Neg Hx   . Diabetes Neg Hx   . Drug abuse Neg Hx   . Early death Neg Hx   . Hearing loss Neg Hx   . Heart disease Neg Hx   . Hyperlipidemia Neg Hx   . Hypertension Neg Hx   . Kidney disease Neg Hx   . Learning disabilities Neg Hx   . Mental illness Neg Hx   . Mental retardation Neg Hx   . Miscarriages / Stillbirths Neg Hx   . Stroke Neg Hx   . Vision loss Neg Hx   . Varicose Veins Neg Hx      Social History   Tobacco Use  . Smoking status: Never Smoker  . Smokeless tobacco: Never Used  Substance Use Topics  . Alcohol use: No  . Drug use: No    Home Medications Prior to Admission medications   Medication Sig Start Date End Date Taking? Authorizing Provider  amoxicillin (AMOXIL) 500 MG capsule Take 1 capsule (500 mg total) by mouth 3 (three) times daily. 12/27/18   Orpah Greek, MD  naproxen (NAPROSYN) 500 MG tablet Take 1 tablet (500 mg total) by mouth 2 (two) times daily as needed for moderate pain. 12/27/18   Orpah Greek, MD    Allergies    Eggs or egg-derived products  Review of Systems   Review of Systems  Constitutional: Negative.   HENT: Negative.   Respiratory: Negative.   Cardiovascular: Negative.   Gastrointestinal: Negative.   Genitourinary: Negative.  Musculoskeletal: Negative.   Skin: Negative.   Neurological: Negative.   All other systems reviewed and are negative.   Physical Exam Updated Vital Signs BP 124/70 (BP Location: Right Arm)   Pulse 95   Temp 99.3 F (37.4 C) (Oral)   Resp 18   LMP 07/10/2018 (Exact Date)   SpO2 100%   Physical Exam Vitals and nursing note reviewed.  Constitutional:      General: She is not in acute distress.    Appearance: She is well-developed. She is not diaphoretic.  HENT:     Head: Atraumatic.  Eyes:     Pupils: Pupils are equal, round, and reactive to light.  Cardiovascular:     Rate and Rhythm: Normal rate and regular rhythm.  Pulmonary:     Effort: Pulmonary effort is normal. No respiratory distress.  Abdominal:     General: There is no distension.     Palpations: Abdomen is soft.  Musculoskeletal:        General: Normal range of motion.     Cervical back: Normal range of motion and neck supple.  Skin:    General: Skin is warm and dry.  Neurological:     Mental Status: She is alert.    ED Results / Procedures / Treatments   Labs (all labs ordered are listed, but  only abnormal results are displayed) Labs Reviewed  POC URINE PREG, ED    EKG None  Radiology No results found.  Procedures Procedures (including critical care time)  Medications Ordered in ED Medications - No data to display  ED Course  I have reviewed the triage vital signs and the nursing notes.  Pertinent labs & imaging results that were available during my care of the patient were reviewed by me and considered in my medical decision making (see chart for details).  23 patient here for confirmatory pregnancy test.  LMP 3 weeks ago.  She is a G2, P1.  No abdominal pain, vaginal bleeding, discharge, concerns for STDs.  She is not taking prenatal vitamins.  She is not followed by GYN.  Pregnancy test here negative.  Possible early pregnancy that is not detected on test.  She will continue to follow her symptoms and if she is late on her cycle she will follow with OB/GYN  Discussed close follow-up with OB/GYN.  She is to return if she has any new worsening symptoms.  The patient has been appropriately medically screened and/or stabilized in the ED. I have low suspicion for any other emergent medical condition which would require further screening, evaluation or treatment in the ED or require inpatient management.     MDM Rules/Calculators/A&P                       Final Clinical Impression(s) / ED Diagnoses Final diagnoses:  Encounter for pregnancy test with result negative    Rx / DC Orders ED Discharge Orders    None       Tyeasha Ebbs A, PA-C 04/30/19 1506    Pricilla Loveless, MD 05/01/19 1610

## 2019-04-30 NOTE — ED Triage Notes (Signed)
Pt coming from home today states she has had 1 positive pregnancy test and 3 negative tests and is requesting blood work to confirm pregnancy.

## 2019-05-24 ENCOUNTER — Ambulatory Visit: Payer: Medicaid Other | Attending: Internal Medicine

## 2019-05-24 DIAGNOSIS — Z20822 Contact with and (suspected) exposure to covid-19: Secondary | ICD-10-CM

## 2019-05-25 LAB — NOVEL CORONAVIRUS, NAA: SARS-CoV-2, NAA: NOT DETECTED

## 2019-06-08 ENCOUNTER — Ambulatory Visit (HOSPITAL_COMMUNITY)
Admission: EM | Admit: 2019-06-08 | Discharge: 2019-06-08 | Disposition: A | Discharge status: Home / Self Care | Payer: Medicaid Other | Attending: Family Medicine | Admitting: Family Medicine

## 2019-06-08 ENCOUNTER — Encounter (HOSPITAL_COMMUNITY): Payer: Self-pay

## 2019-06-08 ENCOUNTER — Other Ambulatory Visit: Payer: Self-pay

## 2019-06-08 DIAGNOSIS — Z113 Encounter for screening for infections with a predominantly sexual mode of transmission: Secondary | ICD-10-CM | POA: Insufficient documentation

## 2019-06-08 LAB — RAPID HIV SCREEN (HIV 1/2 AB+AG)
HIV 1/2 Antibodies: NONREACTIVE
HIV-1 P24 Antigen - HIV24: NONREACTIVE

## 2019-06-08 NOTE — Discharge Instructions (Addendum)
Std screening done.  We will call you with any positive results.

## 2019-06-08 NOTE — ED Triage Notes (Signed)
Pt states she has would like to be checked for STDs, pt states her partner had a bump on his on his butttock.

## 2019-06-08 NOTE — ED Provider Notes (Signed)
[facility]    CSN: [id number] Arrival date & time: 06/08/19  0855      History   Chief Complaint Chief Complaint  Patient presents with  . SEXUALLY TRANSMITTED DISEASE    HPI Anita Robinson is a 24 y.o. female.   Patient is a 24 year old female presents today for STD screening.  Concern for possible exposure.  She currently denies any symptoms     Past Medical History:  Diagnosis Date  . Anemia    on Iron  . Complication of anesthesia    has never had any type of anesthesia.  . Low iron     Patient Active Problem List   Diagnosis Date Noted  . Sepsis secondary to UTI ([place]) 02/17/2018  . TOA (tubo-ovarian abscess) 11/21/2014  . Trichomoniasis 08/08/2013    Past Surgical History:  Procedure Laterality Date  . NO PAST SURGERIES      OB History    Gravida  2   Para  1   Term  1   Preterm  0   AB  0   Living  1     SAB  0   TAB  0   Ectopic  0   Multiple  0   Live Births  1            Home Medications    Prior to Admission medications   Not on File    Family History Family History  Problem Relation Age of Onset  . Asthma Mother   . Asthma Sister   . Asthma Brother   . Alcohol abuse Neg Hx   . Arthritis Neg Hx   . Birth defects Neg Hx   . Cancer Neg Hx   . COPD Neg Hx   . Depression Neg Hx   . Diabetes Neg Hx   . Drug abuse Neg Hx   . Early death Neg Hx   . Hearing loss Neg Hx   . Heart disease Neg Hx   . Hyperlipidemia Neg Hx   . Hypertension Neg Hx   . Kidney disease Neg Hx   . Learning disabilities Neg Hx   . Mental illness Neg Hx   . Mental retardation Neg Hx   . Miscarriages / Stillbirths Neg Hx   . Stroke Neg Hx   . Vision loss Neg Hx   . Varicose Veins Neg Hx     Social History Social History   Tobacco Use  . Smoking status: Never Smoker  . Smokeless tobacco: Never Used  Substance Use Topics  . Alcohol use: No  . Drug use: No     Allergies   Eggs or egg-derived products   Review  of Systems Review of Systems   Physical Exam Triage Vital Signs ED Triage Vitals  Enc Vitals Group     BP 06/08/19 0944 134/76     Pulse Rate 06/08/19 0944 87     Resp 06/08/19 0944 16     Temp 06/08/19 0944 98.4 F (36.9 C)     Temp Source 06/08/19 0944 Oral     SpO2 06/08/19 0944 100 %     Weight 06/08/19 0947 174 lb (78.9 kg)     Height --      Head Circumference --      Peak Flow --      Pain Score 06/08/19 0947 0     Pain Loc --      Pain Edu? --  Excl. in GC? --    No data found.  Updated Vital Signs BP 134/76 (BP Location: Left Arm)   Pulse 87   Temp 98.4 F (36.9 C) (Oral)   Resp 16   Wt 174 lb (78.9 kg)   LMP 05/18/2019   SpO2 100%   BMI 28.96 kg/m   Visual Acuity Right Eye Distance:   Left Eye Distance:   Bilateral Distance:    Right Eye Near:   Left Eye Near:    Bilateral Near:     Physical Exam Vitals and nursing note reviewed.  Constitutional:      General: She is not in acute distress.    Appearance: Normal appearance. She is not ill-appearing, toxic-appearing or diaphoretic.  HENT:     Head: Normocephalic.     Nose: Nose normal.  Eyes:     Conjunctiva/sclera: Conjunctivae normal.  Pulmonary:     Effort: Pulmonary effort is normal.  Musculoskeletal:        General: Normal range of motion.     Cervical back: Normal range of motion.  Skin:    General: Skin is warm and dry.     Findings: No rash.  Neurological:     Mental Status: She is alert.  Psychiatric:        Mood and Affect: Mood normal.      UC Treatments / Results  Labs (all labs ordered are listed, but only abnormal results are displayed) Labs Reviewed  RAPID HIV SCREEN (HIV 1/2 AB+AG)  RPR  CERVICOVAGINAL ANCILLARY ONLY    EKG   Radiology No results found.  Procedures Procedures (including critical care time)  Medications Ordered in UC Medications - No data to display  Initial Impression / Assessment and Plan / UC Course  I have reviewed the triage  vital signs and the nursing notes.  Pertinent labs & imaging results that were available during my care of the patient were reviewed by me and considered in my medical decision making (see chart for details).     STD screening- swab sent for testing HIV and syphilis testing done.  Check MyChart for results Final Clinical Impressions(s) / UC Diagnoses   Final diagnoses:  Screening examination for STD (sexually transmitted disease)     Discharge Instructions     Std screening done.  We will call you with any positive results.    ED Prescriptions    None     PDMP not reviewed this encounter.   Janace Aris, NP 06/08/19 1054

## 2019-06-09 LAB — CERVICOVAGINAL ANCILLARY ONLY
Bacterial Vaginitis (gardnerella): POSITIVE — AB
Candida Glabrata: NEGATIVE
Candida Vaginitis: NEGATIVE
Chlamydia: NEGATIVE
Comment: NEGATIVE
Comment: NEGATIVE
Comment: NEGATIVE
Comment: NEGATIVE
Comment: NEGATIVE
Comment: NORMAL
Neisseria Gonorrhea: NEGATIVE
Trichomonas: NEGATIVE

## 2019-06-09 LAB — RPR: RPR Ser Ql: NONREACTIVE

## 2019-06-13 ENCOUNTER — Encounter (HOSPITAL_COMMUNITY): Payer: Self-pay

## 2019-06-13 ENCOUNTER — Encounter (HOSPITAL_COMMUNITY): Payer: Self-pay | Admitting: Emergency Medicine

## 2019-06-13 ENCOUNTER — Emergency Department (HOSPITAL_COMMUNITY)
Admission: EM | Admit: 2019-06-13 | Discharge: 2019-06-13 | Disposition: A | Discharge status: Home / Self Care | Payer: Medicaid Other | Attending: Emergency Medicine | Admitting: Emergency Medicine

## 2019-06-13 ENCOUNTER — Emergency Department (HOSPITAL_COMMUNITY)
Admission: EM | Admit: 2019-06-13 | Discharge: 2019-06-13 | Disposition: A | Discharge status: Home / Self Care | Payer: Medicaid Other | Source: Home / Self Care

## 2019-06-13 ENCOUNTER — Ambulatory Visit (HOSPITAL_COMMUNITY)
Admission: EM | Admit: 2019-06-13 | Discharge: 2019-06-13 | Disposition: A | Discharge status: Home / Self Care | Payer: Medicaid Other | Attending: Physician Assistant | Admitting: Physician Assistant

## 2019-06-13 ENCOUNTER — Other Ambulatory Visit: Payer: Self-pay

## 2019-06-13 DIAGNOSIS — K047 Periapical abscess without sinus: Secondary | ICD-10-CM

## 2019-06-13 DIAGNOSIS — K0889 Other specified disorders of teeth and supporting structures: Secondary | ICD-10-CM | POA: Insufficient documentation

## 2019-06-13 DIAGNOSIS — Z5321 Procedure and treatment not carried out due to patient leaving prior to being seen by health care provider: Secondary | ICD-10-CM | POA: Insufficient documentation

## 2019-06-13 DIAGNOSIS — Z3201 Encounter for pregnancy test, result positive: Secondary | ICD-10-CM | POA: Diagnosis not present

## 2019-06-13 LAB — POC URINE PREG, ED: Preg Test, Ur: POSITIVE — AB

## 2019-06-13 LAB — POCT PREGNANCY, URINE: Preg Test, Ur: POSITIVE — AB

## 2019-06-13 MED ORDER — AMOXICILLIN-POT CLAVULANATE 875-125 MG PO TABS: 1.0000 | ORAL_TABLET | Freq: Two times a day (BID) | ORAL | 0 refills | Status: AC

## 2019-06-13 MED ORDER — ACETAMINOPHEN 500 MG PO TABS: 500.0000 mg | ORAL_TABLET | Freq: Four times a day (QID) | ORAL | 0 refills | Status: DC | PRN

## 2019-06-13 NOTE — ED Provider Notes (Signed)
[facility]    CSN: [id number] Arrival date & time: 06/13/19  7829      History   Chief Complaint Chief Complaint  Patient presents with  . Dental Pain    HPI Anita Robinson is a 24 y.o. female.   Patient reports urgent care for 1 day history of worsening dental pain.  She reports she has a known bad tooth on the left side of her jaw.  She reports she was due to have this removed today however the dentist canceled her appointment.  She reports issues with this tooth over the years.  She reports pain became severe yesterday with some gum swelling.  She reports she went to 2 different emergency rooms however did not wait to be seen.  Denies fever and chills.  Denies difficulty swallowing.  Denies difficulty opening jaw.  Patient states she is 3 days late for her menstrual cycle.  She is unsure of when her last menstrual cycle was.  She reports she had positive and negative pregnancy tests last month and had a negative test in the emergency department in March.  She reports she did have a menstrual period in March and this was following those pregnancy tests.  She has had some menstrual cramps over the last week or so however is denying any cramping today in office.  Denies vaginal bleeding or discharge.     Past Medical History:  Diagnosis Date  . Anemia    on Iron  . Complication of anesthesia    has never had any type of anesthesia.  . Low iron     Patient Active Problem List   Diagnosis Date Noted  . Sepsis secondary to UTI ([place]) 02/17/2018  . TOA (tubo-ovarian abscess) 11/21/2014  . Trichomoniasis 08/08/2013    Past Surgical History:  Procedure Laterality Date  . NO PAST SURGERIES      OB History    Gravida  2   Para  1   Term  1   Preterm  0   AB  0   Living  1     SAB  0   TAB  0   Ectopic  0   Multiple  0   Live Births  1            Home Medications    Prior to Admission medications   Medication Sig Start Date End Date  Taking? Authorizing Provider  acetaminophen (TYLENOL) 500 MG tablet Take 1 tablet (500 mg total) by mouth every 6 (six) hours as needed. 06/13/19   Avarey Yaeger, Marguerita Beards, PA-C  amoxicillin-clavulanate (AUGMENTIN) 875-125 MG tablet Take 1 tablet by mouth 2 (two) times daily for 10 days. 06/13/19 06/23/19  Aneisa Karren, Marguerita Beards, PA-C    Family History Family History  Problem Relation Age of Onset  . Asthma Mother   . Asthma Sister   . Asthma Brother   . Alcohol abuse Neg Hx   . Arthritis Neg Hx   . Birth defects Neg Hx   . Cancer Neg Hx   . COPD Neg Hx   . Depression Neg Hx   . Diabetes Neg Hx   . Drug abuse Neg Hx   . Early death Neg Hx   . Hearing loss Neg Hx   . Heart disease Neg Hx   . Hyperlipidemia Neg Hx   . Hypertension Neg Hx   . Kidney disease Neg Hx   . Learning disabilities Neg Hx   . Mental illness  Neg Hx   . Mental retardation Neg Hx   . Miscarriages / Stillbirths Neg Hx   . Stroke Neg Hx   . Vision loss Neg Hx   . Varicose Veins Neg Hx     Social History Social History   Tobacco Use  . Smoking status: Never Smoker  . Smokeless tobacco: Never Used  Substance Use Topics  . Alcohol use: No  . Drug use: No     Allergies   Eggs or egg-derived products   Review of Systems Review of Systems   Physical Exam Triage Vital Signs ED Triage Vitals [06/13/19 0837]  Enc Vitals Group     BP      Pulse      Resp      Temp      Temp src      SpO2      Weight      Height      Head Circumference      Peak Flow      Pain Score 9     Pain Loc      Pain Edu?      Excl. in GC?    No data found.  Updated Vital Signs BP (!) 120/91 (BP Location: Left Arm)   Pulse 80   Temp 98.7 F (37.1 C) (Oral)   Resp 19   LMP 05/10/2019   SpO2 98%   Visual Acuity Right Eye Distance:   Left Eye Distance:   Bilateral Distance:    Right Eye Near:   Left Eye Near:    Bilateral Near:     Physical Exam Vitals and nursing note reviewed.  Constitutional:      General: She is  not in acute distress.    Appearance: Normal appearance. She is not ill-appearing.  HENT:     Head: Normocephalic and atraumatic.     Mouth/Throat:      Comments: Dentition overall very poor with multiple decaying and broken teeth.  Oropharynx is clear without obstruction.  Controlling saliva and air well.  No stridorous respirations  Mild tenderness noted to the submandibular area of tooth infection.  No sign of Ludewig's angina.  No lymphadenopathy Cardiovascular:     Rate and Rhythm: Normal rate.  Abdominal:     General: Abdomen is flat.     Tenderness: There is no abdominal tenderness.  Musculoskeletal:     Cervical back: Normal range of motion.  Lymphadenopathy:     Cervical: No cervical adenopathy.  Neurological:     Mental Status: She is alert.      UC Treatments / Results  Labs (all labs ordered are listed, but only abnormal results are displayed) Labs Reviewed  POC URINE PREG, ED - Abnormal; Notable for the following components:      Result Value   Preg Test, Ur POSITIVE (*)    All other components within normal limits  POCT PREGNANCY, URINE - Abnormal; Notable for the following components:   Preg Test, Ur POSITIVE (*)    All other components within normal limits    EKG   Radiology No results found.  Procedures Procedures (including critical care time)  Medications Ordered in UC Medications - No data to display  Initial Impression / Assessment and Plan / UC Course  I have reviewed the triage vital signs and the nursing notes.  Pertinent labs & imaging results that were available during my care of the patient were reviewed by me and considered in my medical  decision making (see chart for details).     #Dental infection #Positive pregnancy test Patient is 24 year old presenting with acute dental infection and had a positive pregnancy test in clinic.  Given early pregnancy do feel that Augmentin is safe.  Discussed with patient to avoid NSAIDs and that  she should use Tylenol for pain.  Patient does have OB/GYN and she will call when she leaves here.  Other OB options were given as well.  Patient struck to follow-up with her dentist.  Patient verbalized understanding.  Precautions for pelvic pain, vaginal bleeding or discharge were discussed and the patient should report to the MAU should these occur. Final Clinical Impressions(s) / UC Diagnoses   Final diagnoses:  Dental infection  Positive pregnancy test     Discharge Instructions     Your pregnancy test was positive. Please follow-up with the [facility] or your OB/GYN for prenatal care.  I would like for you to start taking a prenatal vitamin you may purchase over-the-counter  Start the Augmentin this is twice a day for 10 days  Do not take ibuprofen, Aleve, aspirin or other nonsteroidal anti-inflammatory medications. It is okay to take Tylenol and I want you to take 2 extra strength Tylenol every 6-8 hours for pain.  Please follow-up with your dentist.      ED Prescriptions    Medication Sig Dispense Auth. Provider   amoxicillin-clavulanate (AUGMENTIN) 875-125 MG tablet Take 1 tablet by mouth 2 (two) times daily for 10 days. 20 tablet Katriel Cutsforth, Veryl Speak, PA-C   acetaminophen (TYLENOL) 500 MG tablet Take 1 tablet (500 mg total) by mouth every 6 (six) hours as needed. 30 tablet Jazara Swiney, Veryl Speak, PA-C     PDMP not reviewed this encounter.   Hermelinda Medicus, PA-C 06/13/19 [phone]

## 2019-06-13 NOTE — Discharge Instructions (Addendum)
Your pregnancy test was positive. Please follow-up with the [facility] or your OB/GYN for prenatal care.  I would like for you to start taking a prenatal vitamin you may purchase over-the-counter  Start the Augmentin this is twice a day for 10 days  Do not take ibuprofen, Aleve, aspirin or other nonsteroidal anti-inflammatory medications. It is okay to take Tylenol and I want you to take 2 extra strength Tylenol every 6-8 hours for pain.  Please follow-up with your dentist.

## 2019-06-13 NOTE — ED Notes (Signed)
No answer when called for a room. 

## 2019-06-13 NOTE — ED Triage Notes (Signed)
Pt arriving with dental pain on the lower left side. Pt reports she had an appointment to have affected tooth pulled but appointment was cancelled due to the office needing to quarantine for 14 days. Pt also reports dizziness.

## 2019-06-13 NOTE — ED Triage Notes (Signed)
Pt presents to UC with dental pain x 1 day. Pt reports she had an dentist appointment today to pulled out the tooth, but they will reschedule the appointment. Pt is taking ibuprofen without relief.

## 2019-06-13 NOTE — ED Triage Notes (Signed)
Pt to ED with c/o pain in left lower back tooth, onset a few hours ago.  Pt also she started feeling dizzy when the toothache started.

## 2019-06-14 ENCOUNTER — Encounter (HOSPITAL_COMMUNITY): Payer: Self-pay | Admitting: Family Medicine

## 2019-06-14 ENCOUNTER — Inpatient Hospital Stay (HOSPITAL_COMMUNITY)
Admission: AD | Admit: 2019-06-14 | Discharge: 2019-06-14 | Disposition: A | Discharge status: Other Acute Inpatient Hospital | Payer: Medicaid Other | Attending: Family Medicine | Admitting: Family Medicine

## 2019-06-14 ENCOUNTER — Telehealth (HOSPITAL_COMMUNITY): Payer: Self-pay | Admitting: Physician Assistant

## 2019-06-14 ENCOUNTER — Other Ambulatory Visit: Payer: Self-pay

## 2019-06-14 NOTE — MAU Note (Signed)
Call received from front desk- has decided to leave and go to dr's office, does not want to be seen

## 2019-06-14 NOTE — Telephone Encounter (Cosign Needed)
Patient was called to discuss whether she had been treated for Bacterial Vaginitis from a 06/09/19 results. Patient had positive pregnancy test on 4/20 and reports completing metronidazole on 4/18. Patient to follow up with Ob/Gyn.

## 2019-06-17 ENCOUNTER — Inpatient Hospital Stay (HOSPITAL_COMMUNITY): Payer: Medicaid Other

## 2019-06-17 ENCOUNTER — Inpatient Hospital Stay (HOSPITAL_COMMUNITY)
Admission: AD | Admit: 2019-06-17 | Discharge: 2019-06-17 | Disposition: A | Discharge status: Home / Self Care | Payer: Medicaid Other | Attending: Obstetrics and Gynecology | Admitting: Obstetrics and Gynecology

## 2019-06-17 ENCOUNTER — Encounter (HOSPITAL_COMMUNITY): Payer: Self-pay | Admitting: Obstetrics and Gynecology

## 2019-06-17 ENCOUNTER — Other Ambulatory Visit: Payer: Self-pay

## 2019-06-17 DIAGNOSIS — O26899 Other specified pregnancy related conditions, unspecified trimester: Secondary | ICD-10-CM

## 2019-06-17 DIAGNOSIS — O26891 Other specified pregnancy related conditions, first trimester: Secondary | ICD-10-CM

## 2019-06-17 DIAGNOSIS — R109 Unspecified abdominal pain: Secondary | ICD-10-CM | POA: Insufficient documentation

## 2019-06-17 DIAGNOSIS — O3680X0 Pregnancy with inconclusive fetal viability, not applicable or unspecified: Secondary | ICD-10-CM

## 2019-06-17 DIAGNOSIS — Z3A01 Less than 8 weeks gestation of pregnancy: Secondary | ICD-10-CM | POA: Diagnosis not present

## 2019-06-17 LAB — COMPREHENSIVE METABOLIC PANEL
ALT: 12 U/L (ref 0–44)
AST: 19 U/L (ref 15–41)
Albumin: 3.8 g/dL (ref 3.5–5.0)
Alkaline Phosphatase: 50 U/L (ref 38–126)
Anion gap: 11 (ref 5–15)
BUN: 11 mg/dL (ref 6–20)
CO2: 24 mmol/L (ref 22–32)
Calcium: 9.3 mg/dL (ref 8.9–10.3)
Chloride: 102 mmol/L (ref 98–111)
Creatinine, Ser: 0.69 mg/dL (ref 0.44–1.00)
GFR calc Af Amer: >60 mL/min (ref >60)
GFR calc non Af Amer: >60 mL/min (ref >60)
Glucose, Bld: 99 mg/dL (ref 70–99)
Potassium: 3.7 mmol/L (ref 3.5–5.1)
Sodium: 137 mmol/L (ref 135–145)
Total Bilirubin: 0.4 mg/dL (ref 0.3–1.2)
Total Protein: 6.9 g/dL (ref 6.5–8.1)

## 2019-06-17 LAB — CBC
HCT: 37.2 % (ref 36.0–46.0)
Hemoglobin: 11.4 g/dL — ABNORMAL LOW (ref 12.0–15.0)
MCH: 24.3 pg — ABNORMAL LOW (ref 26.0–34.0)
MCHC: 30.6 g/dL (ref 30.0–36.0)
MCV: 79.3 fL — ABNORMAL LOW (ref 80.0–100.0)
Platelets: 318 10*3/uL (ref 150–400)
RBC: 4.69 MIL/uL (ref 3.87–5.11)
RDW: 13.8 % (ref 11.5–15.5)
WBC: 9.2 10*3/uL (ref 4.0–10.5)
nRBC: 0 % (ref 0.0–0.2)

## 2019-06-17 LAB — HCG, QUANTITATIVE, PREGNANCY: hCG, Beta Chain, Quant, S: 585 m[IU]/mL — ABNORMAL HIGH (ref <5)

## 2019-06-17 NOTE — MAU Note (Signed)
Pt here with complaints of intermittent lower abdominal cramping that started today. Rates pain 3/10. Has not tried anything for pain. Pt denies vaginal bleeding or discharge. No urinary s/s. LMP 05/02/2019. [facility] but was told she was "too early to be seen", but thinks she may go to [facility]. Does not have an appointment.

## 2019-06-17 NOTE — MAU Provider Note (Signed)
History     CSN: [id number]  Arrival date and time: 06/17/19 2210   First Provider Initiated Contact with Patient 06/17/19 2247      Chief Complaint  Patient presents with  . Abdominal Pain   HPI  Anita Robinson is a 24 y.o. G3P1011 at [redacted]w[redacted]d who presents to MAU for evaluation of abdominal pain in the setting of positive pregnancy test at Urgent Care. She describes the pain as "mild but there" and declines pain medicine on arrival in MAU. Patient endorses an LMP of 05/02/2019. She had unprotected sex and took Plan B on 05/10/2019. She experienced moderate vaginal bleeding on 03/21, 03/22 and 03/22. Patient reported to Urgent Care last week for evaluation of tooth pain and was informed that she was pregnant. Now her period is one week late and she is concerned.  She denies severe abdominal pain, dysuria, abdominal tenderness, fever or recent illness.  OB History    Gravida  3   Para  1   Term  1   Preterm  0   AB  1   Living  1     SAB  1   TAB  0   Ectopic  0   Multiple  0   Live Births  1           Past Medical History:  Diagnosis Date  . Anemia    on Iron  . Complication of anesthesia    has never had any type of anesthesia.  . Low iron     Past Surgical History:  Procedure Laterality Date  . NO PAST SURGERIES      Family History  Problem Relation Age of Onset  . Asthma Mother   . Asthma Sister   . Asthma Brother   . Alcohol abuse Neg Hx   . Arthritis Neg Hx   . Birth defects Neg Hx   . Cancer Neg Hx   . COPD Neg Hx   . Depression Neg Hx   . Diabetes Neg Hx   . Drug abuse Neg Hx   . Early death Neg Hx   . Hearing loss Neg Hx   . Heart disease Neg Hx   . Hyperlipidemia Neg Hx   . Hypertension Neg Hx   . Kidney disease Neg Hx   . Learning disabilities Neg Hx   . Mental illness Neg Hx   . Mental retardation Neg Hx   . Miscarriages / Stillbirths Neg Hx   . Stroke Neg Hx   . Vision loss Neg Hx   . Varicose Veins Neg Hx     Social  History   Tobacco Use  . Smoking status: Never Smoker  . Smokeless tobacco: Never Used  Substance Use Topics  . Alcohol use: Not Currently    Comment: social drinker  . Drug use: No    Allergies:  Allergies  Allergen Reactions  . Eggs Or Egg-Derived Products     Pt says she get bumps     Medications Prior to Admission  Medication Sig Dispense Refill Last Dose  . acetaminophen (TYLENOL) 500 MG tablet Take 1 tablet (500 mg total) by mouth every 6 (six) hours as needed. 30 tablet 0 06/17/2019 at Unknown time  . amoxicillin-clavulanate (AUGMENTIN) 875-125 MG tablet Take 1 tablet by mouth 2 (two) times daily for 10 days. 20 tablet 0 06/17/2019 at Unknown time    Review of Systems  Gastrointestinal: Positive for abdominal pain.  Genitourinary: Negative for vaginal bleeding.  Musculoskeletal: Negative for back pain.  All other systems reviewed and are negative.  Physical Exam   Blood pressure 120/64, pulse 86, temperature 99.2 F (37.3 C), resp. rate 18, height 5\' 5"  (1.651 m), weight 77.6 kg, last menstrual period 05/02/2019, unknown if currently breastfeeding.  Physical Exam  Nursing note and vitals reviewed. Constitutional: She is oriented to person, place, and time. She appears well-developed and well-nourished.  Cardiovascular: Normal rate and normal heart sounds.  Respiratory: Effort normal.  Genitourinary:    Genitourinary Comments: Deferred due to recent evaluation, swab collection at Urgent Care   Neurological: She is alert and oriented to person, place, and time.  Skin: Skin is warm and dry.  Psychiatric: She has a normal mood and affect. Her behavior is normal. Judgment and thought content normal.    MAU Course/MDM  Procedures  Patient Vitals for the past 24 hrs:  BP Temp Pulse Resp Height Weight  06/17/19 2353 122/71 - 86 18 - -  06/17/19 2225 120/64 99.2 F (37.3 C) 86 18 5\' 5"  (1.651 m) 77.6 kg   Results for orders placed or performed during the hospital  encounter of 06/17/19 (from the past 24 hour(s))  CBC     Status: Abnormal   Collection Time: 06/17/19 10:39 PM  Result Value Ref Range   WBC 9.2 4.0 - 10.5 K/uL   RBC 4.69 3.87 - 5.11 MIL/uL   Hemoglobin 11.4 (L) 12.0 - 15.0 g/dL   HCT 06/19/19 06/19/19 - 33.2 %   MCV 79.3 (L) 80.0 - 100.0 fL   MCH 24.3 (L) 26.0 - 34.0 pg   MCHC 30.6 30.0 - 36.0 g/dL   RDW 95.1 88.4 - 16.6 %   Platelets 318 150 - 400 K/uL   nRBC 0.0 0.0 - 0.2 %  Comprehensive metabolic panel     Status: None   Collection Time: 06/17/19 10:39 PM  Result Value Ref Range   Sodium 137 135 - 145 mmol/L   Potassium 3.7 3.5 - 5.1 mmol/L   Chloride 102 98 - 111 mmol/L   CO2 24 22 - 32 mmol/L   Glucose, Bld 99 70 - 99 mg/dL   BUN 11 6 - 20 mg/dL   Creatinine, Ser 01.6 0.44 - 1.00 mg/dL   Calcium 9.3 8.9 - 06/19/19 mg/dL   Total Protein 6.9 6.5 - 8.1 g/dL   Albumin 3.8 3.5 - 5.0 g/dL   AST 19 15 - 41 U/L   ALT 12 0 - 44 U/L   Alkaline Phosphatase 50 38 - 126 U/L   Total Bilirubin 0.4 0.3 - 1.2 mg/dL   GFR calc non Af Amer >60 >60 mL/min   GFR calc Af Amer >60 >60 mL/min   Anion gap 11 5 - 15  hCG, quantitative, pregnancy     Status: Abnormal   Collection Time: 06/17/19 10:39 PM  Result Value Ref Range   hCG, Beta Chain, Quant, S 585 (H) <5 mIU/mL  Urinalysis, Routine w reflex microscopic     Status: Abnormal   Collection Time: 06/17/19 11:56 PM  Result Value Ref Range   Color, Urine YELLOW YELLOW   APPearance CLEAR CLEAR   Specific Gravity, Urine 1.033 (H) 1.005 - 1.030   pH 6.0 5.0 - 8.0   Glucose, UA NEGATIVE NEGATIVE mg/dL   Hgb urine dipstick NEGATIVE NEGATIVE   Bilirubin Urine NEGATIVE NEGATIVE   Ketones, ur NEGATIVE NEGATIVE mg/dL   Protein, ur NEGATIVE NEGATIVE mg/dL   Nitrite NEGATIVE NEGATIVE   Leukocytes,Ua  NEGATIVE NEGATIVE   US OB LESS THAN 14 WEEKS WITH OB TRANSVAGINAL  Result Date: 06/17/2019 CLINICAL DATA:  Positive urinary pregnancy test. EXAM: OBSTETRIC <14 WK [place] AND TRANSVAGINAL OB US TECHNIQUE:  Both transabdominal and transvaginal ultrasound examinations were performed for complete evaluation of the gestation as well as the maternal uterus, adnexal regions, and pelvic cul-de-sac. Transvaginal technique was performed to assess early pregnancy. COMPARISON:  None. FINDINGS: Intrauterine gestational sac: Single Yolk sac:  Not Visualized. Embryo:  Not Visualized. Cardiac Activity: Not Visualized. Heart Rate: N/A  bpm MSD: 1.4 mm   N/A w   N/A  d Subchorionic hemorrhage:  None visualized. Maternal uterus/adnexae: The bilateral ovaries are normal in appearance. A trace amount of pelvic free fluid is seen. IMPRESSION: 1. Tiny anechoic structure within the endometrial canal which may represent a small early intrauterine gestational sac. Correlation with follow-up pelvic ultrasound is recommended to confirm the presence of an intrauterine pregnancy. Electronically Signed   By: Virgina Norfolk M.D.   On: 06/17/2019 23:38   Assessment and Plan  --24 y.o. G3P1011 at [redacted]w[redacted]d  --Pregnancy of unknown location --S/p Plan B 03/17 --Discharge home in stable condition with ectopic precautions  F/U: --Appt made for Stat Quant hCG at [facility] for Tuesday 06/20/2019 at [place], [place] 06/18/2019, 12:17 AM

## 2019-06-17 NOTE — MAU Note (Signed)
Pt reports she started having light cramping today.  Denies any vag bleeding or discharge.   LMP 05/02/2019. Had unprotected sex and took a plan B on 3/17. Had positive pregnancy test at urgent care last week.

## 2019-06-17 NOTE — Discharge Instructions (Signed)
Abdominal Pain During Pregnancy  Abdominal pain is common during pregnancy, and has many possible causes. Some causes are more serious than others, and sometimes the cause is not known. Abdominal pain can be a sign that labor is starting. It can also be caused by normal growth and stretching of muscles and ligaments during pregnancy. Always tell your health care provider if you have any abdominal pain. Follow these instructions at home:  Do not have sex or put anything in your vagina until your pain goes away completely.  Get plenty of rest until your pain improves.  Drink enough fluid to keep your urine pale yellow.  Take over-the-counter and prescription medicines only as told by your health care provider.  Keep all follow-up visits as told by your health care provider. This is important. Contact a health care provider if:  Your pain continues or gets worse after resting.  You have lower abdominal pain that: ? Comes and goes at regular intervals. ? Spreads to your back. ? Is similar to menstrual cramps.  You have pain or burning when you urinate. Get help right away if:  You have a fever or chills.  You have vaginal bleeding.  You are leaking fluid from your vagina.  You are passing tissue from your vagina.  You have vomiting or diarrhea that lasts for more than 24 hours.  Your baby is moving less than usual.  You feel very weak or faint.  You have shortness of breath.  You develop severe pain in your upper abdomen. Summary  Abdominal pain is common during pregnancy, and has many possible causes.  If you experience abdominal pain during pregnancy, tell your health care provider right away.  Follow your health care provider's home care instructions and keep all follow-up visits as directed. This information is not intended to replace advice given to you by your health care provider. Make sure you discuss any questions you have with your health care  provider. Document Revised: 05/30/2018 Document Reviewed: 05/14/2016 Elsevier Patient Education  2020 [employer]  

## 2019-06-18 LAB — URINALYSIS, ROUTINE W REFLEX MICROSCOPIC
Bilirubin Urine: NEGATIVE
Glucose, UA: NEGATIVE mg/dL
Hgb urine dipstick: NEGATIVE
Ketones, ur: NEGATIVE mg/dL
Leukocytes,Ua: NEGATIVE
Nitrite: NEGATIVE
Protein, ur: NEGATIVE mg/dL
Specific Gravity, Urine: 1.033 — ABNORMAL HIGH (ref 1.005–1.030)
pH: 6 (ref 5.0–8.0)

## 2019-06-20 ENCOUNTER — Encounter: Payer: Self-pay | Admitting: *Deleted

## 2019-06-20 ENCOUNTER — Other Ambulatory Visit: Payer: Self-pay

## 2019-06-20 ENCOUNTER — Encounter: Payer: Self-pay | Admitting: Family Medicine

## 2019-06-20 ENCOUNTER — Ambulatory Visit (INDEPENDENT_AMBULATORY_CARE_PROVIDER_SITE_OTHER): Payer: Medicaid Other | Admitting: *Deleted

## 2019-06-20 VITALS — BP 103/63 | HR 80 | Ht 65.0 in | Wt 168.4 lb

## 2019-06-20 DIAGNOSIS — O3680X0 Pregnancy with inconclusive fetal viability, not applicable or unspecified: Secondary | ICD-10-CM

## 2019-06-20 LAB — BETA HCG QUANT (REF LAB): hCG Quant: 1072 m[IU]/mL

## 2019-06-20 NOTE — Progress Notes (Signed)
Pt here for stat BHCG. She reports no bleeding and has minimal abdominal cramping - pain scale 1. Pt was advised that she will be notified of lab results later today. She voiced understanding.   1320  BHCG result reviewed by Dr. Salomon Mast who recommended [place] in 2 weeks to assess viability. Pt was called and given test results as well as recommendation for plan of care. Pt was instructed to go to MAU if she develops heavy vaginal bleeding or abdominal/pelvic pain. She voiced understanding and agreed to be notified of [place] appt via MyChart.

## 2019-06-20 NOTE — Progress Notes (Signed)
Chart reviewed for nurse visit. Agree with plan of care.   Philena Obey L, DO 06/20/2019 1:48 PM

## 2019-06-29 ENCOUNTER — Inpatient Hospital Stay (HOSPITAL_COMMUNITY): Payer: Medicaid Other

## 2019-06-29 ENCOUNTER — Inpatient Hospital Stay (HOSPITAL_COMMUNITY)
Admission: AD | Admit: 2019-06-29 | Discharge: 2019-06-29 | Disposition: A | Discharge status: Home / Self Care | Payer: Medicaid Other | Attending: Obstetrics and Gynecology | Admitting: Obstetrics and Gynecology

## 2019-06-29 ENCOUNTER — Encounter (HOSPITAL_COMMUNITY): Payer: Self-pay | Admitting: Obstetrics and Gynecology

## 2019-06-29 ENCOUNTER — Other Ambulatory Visit: Payer: Self-pay

## 2019-06-29 DIAGNOSIS — O3680X0 Pregnancy with inconclusive fetal viability, not applicable or unspecified: Secondary | ICD-10-CM | POA: Diagnosis not present

## 2019-06-29 DIAGNOSIS — Z3A01 Less than 8 weeks gestation of pregnancy: Secondary | ICD-10-CM

## 2019-06-29 DIAGNOSIS — Z3A08 8 weeks gestation of pregnancy: Secondary | ICD-10-CM | POA: Diagnosis not present

## 2019-06-29 DIAGNOSIS — O34531 Maternal care for retroversion of gravid uterus, first trimester: Secondary | ICD-10-CM | POA: Insufficient documentation

## 2019-06-29 DIAGNOSIS — O26891 Other specified pregnancy related conditions, first trimester: Secondary | ICD-10-CM | POA: Insufficient documentation

## 2019-06-29 DIAGNOSIS — R109 Unspecified abdominal pain: Secondary | ICD-10-CM | POA: Diagnosis present

## 2019-06-29 DIAGNOSIS — Z3490 Encounter for supervision of normal pregnancy, unspecified, unspecified trimester: Secondary | ICD-10-CM

## 2019-06-29 LAB — CBC WITH DIFFERENTIAL/PLATELET
Abs Immature Granulocytes: 0.02 10*3/uL (ref 0.00–0.07)
Basophils Absolute: 0 10*3/uL (ref 0.0–0.1)
Basophils Relative: 0 %
Eosinophils Absolute: 0.1 10*3/uL (ref 0.0–0.5)
Eosinophils Relative: 1 %
HCT: 34.6 % — ABNORMAL LOW (ref 36.0–46.0)
Hemoglobin: 10.7 g/dL — ABNORMAL LOW (ref 12.0–15.0)
Immature Granulocytes: 0 %
Lymphocytes Relative: 25 %
Lymphs Abs: 1.8 10*3/uL (ref 0.7–4.0)
MCH: 24.4 pg — ABNORMAL LOW (ref 26.0–34.0)
MCHC: 30.9 g/dL (ref 30.0–36.0)
MCV: 78.8 fL — ABNORMAL LOW (ref 80.0–100.0)
Monocytes Absolute: 0.5 10*3/uL (ref 0.1–1.0)
Monocytes Relative: 6 %
Neutro Abs: 4.8 10*3/uL (ref 1.7–7.7)
Neutrophils Relative %: 68 %
Platelets: 301 10*3/uL (ref 150–400)
RBC: 4.39 MIL/uL (ref 3.87–5.11)
RDW: 13.6 % (ref 11.5–15.5)
WBC: 7.1 10*3/uL (ref 4.0–10.5)
nRBC: 0 % (ref 0.0–0.2)

## 2019-06-29 LAB — HCG, QUANTITATIVE, PREGNANCY: hCG, Beta Chain, Quant, S: 7022 m[IU]/mL — ABNORMAL HIGH (ref <5)

## 2019-06-29 NOTE — MAU Provider Note (Addendum)
Chief Complaint  Patient presents with  . Abdominal Cramping  . Abdominal Pain   Anita Robinson is a 24 y.o. G3P1011 who is currently [redacted]w[redacted]d pregnant that presents to the MAU today for abdominal pain and mild cramping.  The pt reports the pain started last night and is sharp in nature and radiates into her vagina but no where else. It begins in the the RUQ and RLQ before spreading across her entire abdomen. Pt says it lasts for about 5 second before stopping and happens once to twice in a row every couple hours. Pt reports she has not tried anything to relieve the pain but nothing seems to bring the pain on or make or worse. She says she has experienced the pain before during a miscarriage last July 2020. Pt denies any vaginal bleeding, vaginal discharge, constipation, diarrhea, or changes in smell, frequency, or color of urine. She does report feeling nausea when the pain is present but has not vomited.   Pt reports she is also continuing to feel mild abdominal cramping. She reports this is the same cramping she was previously seen in the MAU for (06/17/2019) and it continues to come and go. She is not concerned about the cramping at this time.    OB History    Gravida  3   Para  1   Term  1   Preterm  0   AB  1   Living  1     SAB  1   TAB  0   Ectopic  0   Multiple  0   Live Births  1           Past Medical History:  Diagnosis Date  . Anemia    on Iron  . Complication of anesthesia    has never had any type of anesthesia.  . Low iron     Past Surgical History:  Procedure Laterality Date  . NO PAST SURGERIES      Family History  Problem Relation Age of Onset  . Asthma Mother   . Asthma Sister   . Asthma Brother   . Alcohol abuse Neg Hx   . Arthritis Neg Hx   . Birth defects Neg Hx   . Cancer Neg Hx   . COPD Neg Hx   . Depression Neg Hx   . Diabetes Neg Hx   . Drug abuse Neg Hx   . Early death Neg Hx   . Hearing loss Neg Hx   . Heart disease Neg  Hx   . Hyperlipidemia Neg Hx   . Hypertension Neg Hx   . Kidney disease Neg Hx   . Learning disabilities Neg Hx   . Mental illness Neg Hx   . Mental retardation Neg Hx   . Miscarriages / Stillbirths Neg Hx   . Stroke Neg Hx   . Vision loss Neg Hx   . Varicose Veins Neg Hx     Social History   Tobacco Use  . Smoking status: Never Smoker  . Smokeless tobacco: Never Used  Substance Use Topics  . Alcohol use: Not Currently    Comment: social drinker  . Drug use: No    Allergies:  Allergies  Allergen Reactions  . Eggs Or Egg-Derived Products     Pt says she get bumps     No medications prior to admission.    Review of Systems  Constitutional: Negative.  Negative for chills and fever.  HENT: Negative.  Eyes: Negative.   Respiratory: Negative.   Cardiovascular: Negative.   Gastrointestinal: Positive for abdominal pain and nausea (when pain is present). Negative for blood in stool, constipation, diarrhea and vomiting.  Endocrine: Negative.   Genitourinary: Positive for vaginal pain. Negative for difficulty urinating, dysuria, frequency, hematuria, urgency, vaginal bleeding and vaginal discharge.  Musculoskeletal: Negative.   Skin: Negative.   Allergic/Immunologic: Negative.   Neurological: Negative.   Hematological: Negative.   Psychiatric/Behavioral: Negative.   All other systems reviewed and are negative.  Physical Exam Blood pressure 121/62, pulse 85, temperature 98.5 F (36.9 C), temperature source Oral, resp. rate 16, height 5\' 5"  (1.651 m), weight 77 kg, last menstrual period 05/02/2019, SpO2 100 %, unknown if currently breastfeeding. Physical Exam  Nursing note and vitals reviewed. Constitutional: She is oriented to person, place, and time. She appears well-developed and well-nourished. No distress.  HENT:  Head: Normocephalic and atraumatic.  Right Ear: External ear normal.  Left Ear: External ear normal.  Nose: Nose normal.  Mouth/Throat: Oropharynx is  clear and moist.  Eyes: Pupils are equal, round, and reactive to light. Conjunctivae and EOM are normal.  Cardiovascular: Normal rate, regular rhythm and normal heart sounds. Exam reveals no gallop and no friction rub.  No murmur heard. Respiratory: Effort normal and breath sounds normal. No respiratory distress. She has no wheezes. She has no rales.  GI: Soft. Bowel sounds are normal. She exhibits no mass. There is no abdominal tenderness. There is no rebound and no guarding.  Genitourinary:    Genitourinary Comments: Vaginal exam deferred   Musculoskeletal:        General: Normal range of motion.     Cervical back: Normal range of motion and neck supple.  Neurological: She is alert and oriented to person, place, and time. She has normal reflexes.  Skin: Skin is warm and dry. She is not diaphoretic.  Psychiatric: She has a normal mood and affect. Her behavior is normal. Judgment and thought content normal.   Results for orders placed or performed during the hospital encounter of 06/29/19 (from the past 24 hour(s))  hCG, quantitative, pregnancy     Status: Abnormal   Collection Time: 06/29/19 10:56 AM  Result Value Ref Range   hCG, Beta Chain, Quant, S 7,022 (H) <5 mIU/mL  CBC with Differential/Platelet     Status: Abnormal   Collection Time: 06/29/19 11:04 AM  Result Value Ref Range   WBC 7.1 4.0 - 10.5 K/uL   RBC 4.39 3.87 - 5.11 MIL/uL   Hemoglobin 10.7 (L) 12.0 - 15.0 g/dL   HCT 08/29/19 (L) 56.2 - 56.3 %   MCV 78.8 (L) 80.0 - 100.0 fL   MCH 24.4 (L) 26.0 - 34.0 pg   MCHC 30.9 30.0 - 36.0 g/dL   RDW 89.3 73.4 - 28.7 %   Platelets 301 150 - 400 K/uL   nRBC 0.0 0.0 - 0.2 %   Neutrophils Relative % 68 %   Neutro Abs 4.8 1.7 - 7.7 K/uL   Lymphocytes Relative 25 %   Lymphs Abs 1.8 0.7 - 4.0 K/uL   Monocytes Relative 6 %   Monocytes Absolute 0.5 0.1 - 1.0 K/uL   Eosinophils Relative 1 %   Eosinophils Absolute 0.1 0.0 - 0.5 K/uL   Basophils Relative 0 %   Basophils Absolute 0.0 0.0  - 0.1 K/uL   Immature Granulocytes 0 %   Abs Immature Granulocytes 0.02 0.00 - 0.07 K/uL    MAU/MDM Course Procedures  Lab  Orders     Urinalysis, Routine w reflex microscopic     hCG, quantitative, pregnancy     CBC with Differential/Platelet   Beta-hCG, Obstetric <14 wk [place] and Transvaginal OB US Technique, CBC ordered to assess fetal well-being.  [place] Results: COMPARISON:  06/17/2019 FINDINGS: Intrauterine gestational sac: Single Yolk sac:  Visualized. Embryo:  Visualized. Cardiac Activity: Visualized. Heart Rate: 94 bpm CRL:   2.9 mm   5 w 5 d                  [place] EDC: 02/24/2020 Subchorionic hemorrhage:  None visualized.  Maternal uterus/adnexae: Retroverted uterus. Right ovarian corpus luteal cyst. Unremarkable left ovary. No free fluid within the Pelvis.  IMPRESSION: 1. Single live intrauterine gestation measuring 5 weeks 5 days by crown-rump length. 2. Active embryonic heart tones at 94 bpm.    Assessment/Plan Anita Robinson is a 24 y.o. G3P1011 who is estimated to be [redacted]w[redacted]d pregnant based on today's [place] that presents today in stable condition for abdominal pain and mild cramping. Due to the reassuring [place], b-HCG, and CBC the pt is ok to be discharged home in stable condition.    Laural Benes, MS3 [employer]  Attestation of Supervision of Student:  I confirm that I have verified the information documented in the medical student's note and that I have also personally reperformed the history, physical exam and all medical decision making activities.  I have verified that all services and findings are accurately documented in this student's note; and I agree with management and plan as outlined in the documentation. I have also made any necessary editorial changes.  See separate provider note.  Marylen Ponto, NP [facility] [employer], [facility] 06/29/2019 2:33 PM

## 2019-06-29 NOTE — MAU Note (Signed)
PT reporting new onset intermittent sharp, lower abdominal pain. Still having some mild cramping, but no bleeding.

## 2019-06-29 NOTE — Discharge Instructions (Signed)
[facility]     [facility]     Phone: [phone]  [facility] [place]  Phone: [phone]  [facility] [place]  Phone: [phone]  [facility]                           Phone: [phone]  [facility]          Phone: [phone]  [facility]    Phone: [phone]   [facility]    Phone: [phone]  [facility]    Phone: [phone]  [facility]    Phone: [phone]  [facility]    Phone: [phone]  [facility]  Phone: [phone]  [facility]               Phone: [phone]  [facility]   Phone: [phone]  [facility]    Phone: [phone]                                  Safe Medications in Pregnancy    Acne: Benzoyl Peroxide Salicylic Acid  Backache/Headache: Tylenol: 2 regular strength every 4 hours OR              2 Extra strength every 6 hours  Colds/Coughs/Allergies: Benadryl (alcohol free) 25 mg every 6 hours as needed Breath right strips Claritin Cepacol throat lozenges Chloraseptic throat spray Cold-Eeze- up to three times per day Cough drops, alcohol free Flonase (by prescription only) Guaifenesin Mucinex Robitussin DM (plain only, alcohol free) Saline nasal spray/drops Sudafed (pseudoephedrine) & Actifed ** use only after [redacted] weeks gestation and if you do not have high blood pressure Tylenol Vicks Vaporub Zinc lozenges Zyrtec   Constipation: Colace Ducolax suppositories Fleet enema Glycerin suppositories Metamucil Milk of magnesia Miralax Senokot Smooth move tea  Diarrhea: Kaopectate Imodium A-D  *NO pepto Bismol  Hemorrhoids: Anusol Anusol  HC Preparation H Tucks  Indigestion: Tums Maalox Mylanta Zantac  Pepcid  Insomnia: Benadryl (alcohol free) 25mg  every 6 hours as needed Tylenol PM Unisom, no Gelcaps  Leg Cramps: Tums MagGel  Nausea/Vomiting:  Bonine Dramamine Emetrol Ginger extract Sea bands Meclizine  Nausea medication to take during pregnancy:  Unisom (doxylamine succinate 25 mg tablets) Take one tablet daily at bedtime. If symptoms are not adequately controlled, the dose can be increased to a maximum recommended dose of two tablets daily (1/2 tablet in the morning, 1/2 tablet mid-afternoon and one at bedtime). Vitamin B6 100mg  tablets. Take one tablet twice a day (up to 200 mg per day).  Skin Rashes: Aveeno products Benadryl cream or 25mg  every 6 hours as needed Calamine Lotion 1% cortisone cream  Yeast infection: Gyne-lotrimin 7 Monistat 7   **If taking multiple medications, please check labels to avoid duplicating the same active ingredients **take medication as directed on the label ** Do not exceed 4000 mg of tylenol in 24 hours **Do not take medications that contain aspirin or ibuprofen         Abdominal Pain During Pregnancy  Abdominal pain is common during pregnancy, and has many possible causes. Some causes are more serious than others, and sometimes the cause is not known. Abdominal pain can be a sign that  labor is starting. It can also be caused by normal growth and stretching of muscles and ligaments during pregnancy. Always tell your health care provider if you have any abdominal pain. Follow these instructions at home:  Do not have sex or put anything in your vagina until your pain goes away completely.  Get plenty of rest until your pain improves.  Drink enough fluid to keep your urine pale yellow.  Take over-the-counter and prescription medicines only as told by your health care provider.  Keep all follow-up visits as told by your health care provider. This is  important. Contact a health care provider if:  Your pain continues or gets worse after resting.  You have lower abdominal pain that: ? Comes and goes at regular intervals. ? Spreads to your back. ? Is similar to menstrual cramps.  You have pain or burning when you urinate. Get help right away if:  You have a fever or chills.  You have vaginal bleeding.  You are leaking fluid from your vagina.  You are passing tissue from your vagina.  You have vomiting or diarrhea that lasts for more than 24 hours.  Your baby is moving less than usual.  You feel very weak or faint.  You have shortness of breath.  You develop severe pain in your upper abdomen. Summary  Abdominal pain is common during pregnancy, and has many possible causes.  If you experience abdominal pain during pregnancy, tell your health care provider right away.  Follow your health care provider's home care instructions and keep all follow-up visits as directed. This information is not intended to replace advice given to you by your health care provider. Make sure you discuss any questions you have with your health care provider. Document Revised: 05/30/2018 Document Reviewed: 05/14/2016 Elsevier Patient Education  2020 [employer].       First Trimester of Pregnancy The first trimester of pregnancy is from week 1 until the end of week 13 (months 1 through 3). A week after a sperm fertilizes an egg, the egg will implant on the wall of the uterus. This embryo will begin to develop into a baby. Genes from you and your partner will form the baby. The female genes will determine whether the baby will be a boy or a girl. At 6-8 weeks, the eyes and face will be formed, and the heartbeat can be seen on ultrasound. At the end of 12 weeks, all the baby's organs will be formed. Now that you are pregnant, you will want to do everything you can to have a healthy baby. Two of the most important things are to get good prenatal  care and to follow your health care provider's instructions. Prenatal care is all the medical care you receive before the baby's birth. This care will help prevent, find, and treat any problems during the pregnancy and childbirth. Body changes during your first trimester Your body goes through many changes during pregnancy. The changes vary from woman to woman.  You may gain or lose a couple of pounds at first.  You may feel sick to your stomach (nauseous) and you may throw up (vomit). If the vomiting is uncontrollable, call your health care provider.  You may tire easily.  You may develop headaches that can be relieved by medicines. All medicines should be approved by your health care provider.  You may urinate more often. Painful urination may mean you have a bladder infection.  You may develop heartburn as a result of  your pregnancy.  You may develop constipation because certain hormones are causing the muscles that push stool through your intestines to slow down.  You may develop hemorrhoids or swollen veins (varicose veins).  Your breasts may begin to grow larger and become tender. Your nipples may stick out more, and the tissue that surrounds them (areola) may become darker.  Your gums may bleed and may be sensitive to brushing and flossing.  Dark spots or blotches (chloasma, mask of pregnancy) may develop on your face. This will likely fade after the baby is born.  Your menstrual periods will stop.  You may have a loss of appetite.  You may develop cravings for certain kinds of food.  You may have changes in your emotions from day to day, such as being excited to be pregnant or being concerned that something may go wrong with the pregnancy and baby.  You may have more vivid and strange dreams.  You may have changes in your hair. These can include thickening of your hair, rapid growth, and changes in texture. Some women also have hair loss during or after pregnancy, or hair  that feels dry or thin. Your hair will most likely return to normal after your baby is born. What to expect at prenatal visits During a routine prenatal visit:  You will be weighed to make sure you and the baby are growing normally.  Your blood pressure will be taken.  Your abdomen will be measured to track your baby's growth.  The fetal heartbeat will be listened to between weeks 10 and 14 of your pregnancy.  Test results from any previous visits will be discussed. Your health care provider may ask you:  How you are feeling.  If you are feeling the baby move.  If you have had any abnormal symptoms, such as leaking fluid, bleeding, severe headaches, or abdominal cramping.  If you are using any tobacco products, including cigarettes, chewing tobacco, and electronic cigarettes.  If you have any questions. Other tests that may be performed during your first trimester include:  Blood tests to find your blood type and to check for the presence of any previous infections. The tests will also be used to check for low iron levels (anemia) and protein on red blood cells (Rh antibodies). Depending on your risk factors, or if you previously had diabetes during pregnancy, you may have tests to check for high blood sugar that affects pregnant women (gestational diabetes).  Urine tests to check for infections, diabetes, or protein in the urine.  An ultrasound to confirm the proper growth and development of the baby.  Fetal screens for spinal cord problems (spina bifida) and Down syndrome.  HIV (human immunodeficiency virus) testing. Routine prenatal testing includes screening for HIV, unless you choose not to have this test.  You may need other tests to make sure you and the baby are doing well. Follow these instructions at home: Medicines  Follow your health care provider's instructions regarding medicine use. Specific medicines may be either safe or unsafe to take during  pregnancy.  Take a prenatal vitamin that contains at least 600 micrograms (mcg) of folic acid.  If you develop constipation, try taking a stool softener if your health care provider approves. Eating and drinking   Eat a balanced diet that includes fresh fruits and vegetables, whole grains, good sources of protein such as meat, eggs, or tofu, and low-fat dairy. Your health care provider will help you determine the amount of weight gain that  is right for you.  Avoid raw meat and uncooked cheese. These carry germs that can cause birth defects in the baby.  Eating four or five small meals rather than three large meals a day may help relieve nausea and vomiting. If you start to feel nauseous, eating a few soda crackers can be helpful. Drinking liquids between meals, instead of during meals, also seems to help ease nausea and vomiting.  Limit foods that are high in fat and processed sugars, such as fried and sweet foods.  To prevent constipation: ? Eat foods that are high in fiber, such as fresh fruits and vegetables, whole grains, and beans. ? Drink enough fluid to keep your urine clear or pale yellow. Activity  Exercise only as directed by your health care provider. Most women can continue their usual exercise routine during pregnancy. Try to exercise for 30 minutes at least 5 days a week. Exercising will help you: ? Control your weight. ? Stay in shape. ? Be prepared for labor and delivery.  Experiencing pain or cramping in the lower abdomen or lower back is a good sign that you should stop exercising. Check with your health care provider before continuing with normal exercises.  Try to avoid standing for long periods of time. Move your legs often if you must stand in one place for a long time.  Avoid heavy lifting.  Wear low-heeled shoes and practice good posture.  You may continue to have sex unless your health care provider tells you not to. Relieving pain and discomfort  Wear a  good support bra to relieve breast tenderness.  Take warm sitz baths to soothe any pain or discomfort caused by hemorrhoids. Use hemorrhoid cream if your health care provider approves.  Rest with your legs elevated if you have leg cramps or low back pain.  If you develop varicose veins in your legs, wear support hose. Elevate your feet for 15 minutes, 3-4 times a day. Limit salt in your diet. Prenatal care  Schedule your prenatal visits by the twelfth week of pregnancy. They are usually scheduled monthly at first, then more often in the last 2 months before delivery.  Write down your questions. Take them to your prenatal visits.  Keep all your prenatal visits as told by your health care provider. This is important. Safety  Wear your seat belt at all times when driving.  Make a list of emergency phone numbers, including numbers for family, friends, the hospital, and police and fire departments. General instructions  Ask your health care provider for a referral to a local prenatal education class. Begin classes no later than the beginning of month 6 of your pregnancy.  Ask for help if you have counseling or nutritional needs during pregnancy. Your health care provider can offer advice or refer you to specialists for help with various needs.  Do not use hot tubs, steam rooms, or saunas.  Do not douche or use tampons or scented sanitary pads.  Do not cross your legs for long periods of time.  Avoid cat litter boxes and soil used by cats. These carry germs that can cause birth defects in the baby and possibly loss of the fetus by miscarriage or stillbirth.  Avoid all smoking, herbs, alcohol, and medicines not prescribed by your health care provider. Chemicals in these products affect the formation and growth of the baby.  Do not use any products that contain nicotine or tobacco, such as cigarettes and e-cigarettes. If you need help quitting, ask  your health care provider. You may  receive counseling support and other resources to help you quit.  Schedule a dentist appointment. At home, brush your teeth with a soft toothbrush and be gentle when you floss. Contact a health care provider if:  You have dizziness.  You have mild pelvic cramps, pelvic pressure, or nagging pain in the abdominal area.  You have persistent nausea, vomiting, or diarrhea.  You have a bad smelling vaginal discharge.  You have pain when you urinate.  You notice increased swelling in your face, hands, legs, or ankles.  You are exposed to fifth disease or chickenpox.  You are exposed to [place] measles (rubella) and have never had it. Get help right away if:  You have a fever.  You are leaking fluid from your vagina.  You have spotting or bleeding from your vagina.  You have severe abdominal cramping or pain.  You have rapid weight gain or loss.  You vomit blood or material that looks like coffee grounds.  You develop a severe headache.  You have shortness of breath.  You have any kind of trauma, such as from a fall or a car accident. Summary  The first trimester of pregnancy is from week 1 until the end of week 13 (months 1 through 3).  Your body goes through many changes during pregnancy. The changes vary from woman to woman.  You will have routine prenatal visits. During those visits, your health care provider will examine you, discuss any test results you may have, and talk with you about how you are feeling. This information is not intended to replace advice given to you by your health care provider. Make sure you discuss any questions you have with your health care provider. Document Revised: 01/22/2017 Document Reviewed: 01/22/2016 Elsevier Patient Education  2020 [employer].

## 2019-06-29 NOTE — MAU Provider Note (Signed)
History     CSN: [id number]  Arrival date and time: 06/29/19 1008   First Provider Initiated Contact with Patient 06/29/19 1126      Chief Complaint  Patient presents with  . Abdominal Cramping  . Abdominal Pain   [name] is a 24 y.o. G3P1011 at [redacted]w[redacted]d who presents to MAU for abdominal pain and cramping. Pt reports last bowel movement was last night. Patient reports SB July 2020 and reports similar pains that felt like the sharp pains she is having at this time, and is concerned that she is having another miscarriage.  Onset: yesterday for "strong pain" Location: entire abdomen, but patient unsure because "I don't really pay that much attention to it" Duration: <24hrs Character: intermittent, sharp, "like a rupture," lasts <5seconds, cramping intermittent and mild Aggravating/Associated: none/none Relieving: none Treatment: none Severity: 0/10  Pt denies VB, LOF, ctx, decreased FM, vaginal discharge/odor/itching. Pt denies N/V, abdominal pain, constipation, diarrhea, or urinary problems. Pt denies fever, chills, fatigue, sweating or changes in appetite. Pt denies SOB or chest pain. Pt denies dizziness, HA, light-headedness, weakness.  Problems this pregnancy include: pt has not yet been seen. Allergies? Eggs Current medications/supplements? none Prenatal care provider? Pt requests list of OB providers   OB History    Gravida  3   Para  1   Term  1   Preterm  0   AB  1   Living  1     SAB  1   TAB  0   Ectopic  0   Multiple  0   Live Births  1           Past Medical History:  Diagnosis Date  . Anemia    on Iron  . Complication of anesthesia    has never had any type of anesthesia.  . Low iron     Past Surgical History:  Procedure Laterality Date  . NO PAST SURGERIES      Family History  Problem Relation Age of Onset  . Asthma Mother   . Asthma Sister   . Asthma Brother   . Alcohol abuse Neg Hx   . Arthritis Neg Hx   .  Birth defects Neg Hx   . Cancer Neg Hx   . COPD Neg Hx   . Depression Neg Hx   . Diabetes Neg Hx   . Drug abuse Neg Hx   . Early death Neg Hx   . Hearing loss Neg Hx   . Heart disease Neg Hx   . Hyperlipidemia Neg Hx   . Hypertension Neg Hx   . Kidney disease Neg Hx   . Learning disabilities Neg Hx   . Mental illness Neg Hx   . Mental retardation Neg Hx   . Miscarriages / Stillbirths Neg Hx   . Stroke Neg Hx   . Vision loss Neg Hx   . Varicose Veins Neg Hx     Social History   Tobacco Use  . Smoking status: Never Smoker  . Smokeless tobacco: Never Used  Substance Use Topics  . Alcohol use: Not Currently    Comment: social drinker  . Drug use: No    Allergies:  Allergies  Allergen Reactions  . Eggs Or Egg-Derived Products     Pt says she get bumps     No medications prior to admission.    Review of Systems  Constitutional: Negative for chills, diaphoresis, fatigue and fever.  Eyes: Negative for visual  disturbance.  Respiratory: Negative for shortness of breath.   Cardiovascular: Negative for chest pain.  Gastrointestinal: Positive for abdominal pain. Negative for constipation, diarrhea, nausea and vomiting.  Genitourinary: Negative for dysuria, flank pain, frequency, pelvic pain, urgency, vaginal bleeding and vaginal discharge.  Neurological: Negative for dizziness, weakness, light-headedness and headaches.   Physical Exam   Blood pressure 121/62, pulse 85, temperature 98.5 F (36.9 C), temperature source Oral, resp. rate 16, height 5\' 5"  (1.651 m), weight 77 kg, last menstrual period 05/02/2019, SpO2 100 %, unknown if currently breastfeeding.  Patient Vitals for the past 24 hrs:  BP Temp Temp src Pulse Resp SpO2 Height Weight  06/29/19 1306 121/62 -- -- -- -- -- -- --  06/29/19 1032 111/75 98.5 F (36.9 C) Oral 85 16 100 % 5\' 5"  (1.651 m) 77 kg    Physical Exam  Constitutional: She is oriented to person, place, and time. She appears well-developed and  well-nourished. No distress.  HENT:  Head: Normocephalic and atraumatic.  Respiratory: Effort normal.  GI: Soft. She exhibits no distension and no mass. There is no abdominal tenderness. There is no rebound and no guarding.  Neurological: She is alert and oriented to person, place, and time.  Skin: Skin is warm and dry. She is not diaphoretic.  Psychiatric: She has a normal mood and affect. Her behavior is normal. Judgment and thought content normal.  Pelvic exam deferred d/t symptoms.  Results for orders placed or performed during the hospital encounter of 06/29/19 (from the past 24 hour(s))  hCG, quantitative, pregnancy     Status: Abnormal   Collection Time: 06/29/19 10:56 AM  Result Value Ref Range   hCG, Beta Chain, Quant, S 7,022 (H) <5 mIU/mL  CBC with Differential/Platelet     Status: Abnormal   Collection Time: 06/29/19 11:04 AM  Result Value Ref Range   WBC 7.1 4.0 - 10.5 K/uL   RBC 4.39 3.87 - 5.11 MIL/uL   Hemoglobin 10.7 (L) 12.0 - 15.0 g/dL   HCT 08/29/19 (L) 08/29/19 - 76.2 %   MCV 78.8 (L) 80.0 - 100.0 fL   MCH 24.4 (L) 26.0 - 34.0 pg   MCHC 30.9 30.0 - 36.0 g/dL   RDW 83.1 51.7 - 61.6 %   Platelets 301 150 - 400 K/uL   nRBC 0.0 0.0 - 0.2 %   Neutrophils Relative % 68 %   Neutro Abs 4.8 1.7 - 7.7 K/uL   Lymphocytes Relative 25 %   Lymphs Abs 1.8 0.7 - 4.0 K/uL   Monocytes Relative 6 %   Monocytes Absolute 0.5 0.1 - 1.0 K/uL   Eosinophils Relative 1 %   Eosinophils Absolute 0.1 0.0 - 0.5 K/uL   Basophils Relative 0 %   Basophils Absolute 0.0 0.0 - 0.1 K/uL   Immature Granulocytes 0 %   Abs Immature Granulocytes 0.02 0.00 - 0.07 K/uL   07.3 OB Transvaginal  Result Date: 06/29/2019 CLINICAL DATA:  Abdominal pain EXAM: TRANSVAGINAL OB ULTRASOUND TECHNIQUE: Transvaginal ultrasound was performed for complete evaluation of the gestation as well as the maternal uterus, adnexal regions, and pelvic cul-de-sac. COMPARISON:  06/17/2019 FINDINGS: Intrauterine gestational sac:  Single Yolk sac:  Visualized. Embryo:  Visualized. Cardiac Activity: Visualized. Heart Rate: 94 bpm CRL:   2.9 mm   5 w 5 d                  08/29/2019 EDC: 02/24/2020 Subchorionic hemorrhage:  None visualized. Maternal uterus/adnexae: Retroverted uterus. Right ovarian corpus  luteal cyst. Unremarkable left ovary. No free fluid within the pelvis. IMPRESSION: 1. Single live intrauterine gestation measuring 5 weeks 5 days by crown-rump length. 2. Active embryonic heart tones at 94 bpm. Electronically Signed   By: Duanne Guess D.O.   On: 06/29/2019 11:58   US OB LESS THAN 14 WEEKS WITH OB TRANSVAGINAL  Result Date: 06/17/2019 CLINICAL DATA:  Positive urinary pregnancy test. EXAM: OBSTETRIC <14 WK [place] AND TRANSVAGINAL OB US TECHNIQUE: Both transabdominal and transvaginal ultrasound examinations were performed for complete evaluation of the gestation as well as the maternal uterus, adnexal regions, and pelvic cul-de-sac. Transvaginal technique was performed to assess early pregnancy. COMPARISON:  None. FINDINGS: Intrauterine gestational sac: Single Yolk sac:  Not Visualized. Embryo:  Not Visualized. Cardiac Activity: Not Visualized. Heart Rate: N/A  bpm MSD: 1.4 mm   N/A w   N/A  d Subchorionic hemorrhage:  None visualized. Maternal uterus/adnexae: The bilateral ovaries are normal in appearance. A trace amount of pelvic free fluid is seen. IMPRESSION: 1. Tiny anechoic structure within the endometrial canal which may represent a small early intrauterine gestational sac. Correlation with follow-up pelvic ultrasound is recommended to confirm the presence of an intrauterine pregnancy. Electronically Signed   By: Aram Candela M.D.   On: 06/17/2019 23:38    MAU Course  Procedures  MDM -r/o ectopic -normally rising hCG x2 -pregnancy of unknown location -UA: patient left urine specimen, but specimen was left sitting for too long, unable to perform testing as patient has already been discharged from MAU at time of  notification of urine sample -CBC: WNL for pregnancy -[place]: single IUP, [redacted]w[redacted]d, FHR 94 -hCG: 7,022 -ABO: O Positive -WetPrep/GC/CT collected 06/08/2019, normal -pt discharged to home in stable condition  Orders Placed This Encounter  Procedures  . US OB Transvaginal    Standing Status:   Standing    Number of Occurrences:   1    Order Specific Question:   Symptom/Reason for Exam    Answer:   Pregnancy of unknown anatomic location [025852]  . hCG, quantitative, pregnancy    Standing Status:   Standing    Number of Occurrences:   1  . CBC with Differential/Platelet    Standing Status:   Standing    Number of Occurrences:   1  . Discharge patient    Order Specific Question:   Discharge disposition    Answer:   01-Home or Self Care [1]    Order Specific Question:   Discharge patient date    Answer:   06/29/2019   No orders of the defined types were placed in this encounter.  Assessment and Plan   1. Intrauterine pregnancy   2. Pregnancy of unknown anatomic location   3. [redacted] weeks gestation of pregnancy   4. Abdominal pain during pregnancy in first trimester    Allergies as of 06/29/2019      Reactions   Eggs Or Egg-derived Products    Pt says she get bumps       Medication List    TAKE these medications   acetaminophen 500 MG tablet Commonly known as: TYLENOL Take 1 tablet (500 mg total) by mouth every 6 (six) hours as needed.      -will call with culture results, if positive -list of OB providers given, patient to start prenatal care -message sent to [facility] to cancel [place] for 07/05/2019 -safe meds in pregnancy list given -discussed possible causes of abdominal pain in pregnancy -pt to return with worsening/new  symptoms -return MAU precautions given -pt discharged to home in stable condition  Odie Sera Holger Sokolowski 06/29/2019, 2:34 PM

## 2019-07-05 ENCOUNTER — Ambulatory Visit: Payer: Medicaid Other

## 2019-07-05 ENCOUNTER — Ambulatory Visit (HOSPITAL_COMMUNITY): Payer: Medicaid Other

## 2019-07-13 ENCOUNTER — Encounter (HOSPITAL_COMMUNITY): Payer: Self-pay | Admitting: Obstetrics & Gynecology

## 2019-07-13 ENCOUNTER — Other Ambulatory Visit: Payer: Self-pay

## 2019-07-13 ENCOUNTER — Inpatient Hospital Stay (HOSPITAL_COMMUNITY)
Admission: AD | Admit: 2019-07-13 | Discharge: 2019-07-13 | Disposition: A | Discharge status: Home / Self Care | Payer: Medicaid Other | Attending: Obstetrics & Gynecology | Admitting: Obstetrics & Gynecology

## 2019-07-13 DIAGNOSIS — O26891 Other specified pregnancy related conditions, first trimester: Secondary | ICD-10-CM | POA: Diagnosis not present

## 2019-07-13 DIAGNOSIS — B373 Candidiasis of vulva and vagina: Secondary | ICD-10-CM

## 2019-07-13 DIAGNOSIS — N898 Other specified noninflammatory disorders of vagina: Secondary | ICD-10-CM | POA: Diagnosis present

## 2019-07-13 DIAGNOSIS — R102 Pelvic and perineal pain: Secondary | ICD-10-CM | POA: Diagnosis not present

## 2019-07-13 DIAGNOSIS — Z3A01 Less than 8 weeks gestation of pregnancy: Secondary | ICD-10-CM | POA: Insufficient documentation

## 2019-07-13 DIAGNOSIS — R109 Unspecified abdominal pain: Secondary | ICD-10-CM

## 2019-07-13 DIAGNOSIS — O99711 Diseases of the skin and subcutaneous tissue complicating pregnancy, first trimester: Secondary | ICD-10-CM | POA: Insufficient documentation

## 2019-07-13 DIAGNOSIS — Z91012 Allergy to eggs: Secondary | ICD-10-CM | POA: Insufficient documentation

## 2019-07-13 DIAGNOSIS — L299 Pruritus, unspecified: Secondary | ICD-10-CM | POA: Insufficient documentation

## 2019-07-13 DIAGNOSIS — B3731 Acute candidiasis of vulva and vagina: Secondary | ICD-10-CM

## 2019-07-13 DIAGNOSIS — O26899 Other specified pregnancy related conditions, unspecified trimester: Secondary | ICD-10-CM

## 2019-07-13 LAB — WET PREP, GENITAL
Clue Cells Wet Prep HPF POC: NONE SEEN
Sperm: NONE SEEN
Trich, Wet Prep: NONE SEEN
Yeast Wet Prep HPF POC: NONE SEEN

## 2019-07-13 LAB — URINALYSIS, ROUTINE W REFLEX MICROSCOPIC
Bacteria, UA: NONE SEEN
Bilirubin Urine: NEGATIVE
Glucose, UA: NEGATIVE mg/dL
Hgb urine dipstick: NEGATIVE
Ketones, ur: NEGATIVE mg/dL
Leukocytes,Ua: NEGATIVE
Nitrite: NEGATIVE
Protein, ur: 30 mg/dL — AB
Specific Gravity, Urine: 1.025 (ref 1.005–1.030)
pH: 6 (ref 5.0–8.0)

## 2019-07-13 MED ORDER — TERCONAZOLE 0.4 % VA CREA: 1.0000 | TOPICAL_CREAM | Freq: Every day | VAGINAL | 0 refills | Status: DC

## 2019-07-13 NOTE — Discharge Instructions (Signed)
First Trimester of Pregnancy  The first trimester of pregnancy is from week 1 until the end of week 13 (months 1 through 3). During this time, your baby will begin to develop inside you. At 6-8 weeks, the eyes and face are formed, and the heartbeat can be seen on ultrasound. At the end of 12 weeks, all the baby's organs are formed. Prenatal care is all the medical care you receive before the birth of your baby. Make sure you get good prenatal care and follow all of your doctor's instructions. Follow these instructions at home: Medicines  Take over-the-counter and prescription medicines only as told by your doctor. Some medicines are safe and some medicines are not safe during pregnancy.  Take a prenatal vitamin that contains at least 600 micrograms (mcg) of folic acid.  If you have trouble pooping (constipation), take medicine that will make your stool soft (stool softener) if your doctor approves. Eating and drinking   Eat regular, healthy meals.  Your doctor will tell you the amount of weight gain that is right for you.  Avoid raw meat and uncooked cheese.  If you feel sick to your stomach (nauseous) or throw up (vomit): ? Eat 4 or 5 small meals a day instead of 3 large meals. ? Try eating a few soda crackers. ? Drink liquids between meals instead of during meals.  To prevent constipation: ? Eat foods that are high in fiber, like fresh fruits and vegetables, whole grains, and beans. ? Drink enough fluids to keep your pee (urine) clear or pale yellow. Activity  Exercise only as told by your doctor. Stop exercising if you have cramps or pain in your lower belly (abdomen) or low back.  Do not exercise if it is too hot, too humid, or if you are in a place of great height (high altitude).  Try to avoid standing for long periods of time. Move your legs often if you must stand in one place for a long time.  Avoid heavy lifting.  Wear low-heeled shoes. Sit and stand up  straight.  You can have sex unless your doctor tells you not to. Relieving pain and discomfort  Wear a good support bra if your breasts are sore.  Take warm water baths (sitz baths) to soothe pain or discomfort caused by hemorrhoids. Use hemorrhoid cream if your doctor says it is okay.  Rest with your legs raised if you have leg cramps or low back pain.  If you have puffy, bulging veins (varicose veins) in your legs: ? Wear support hose or compression stockings as told by your doctor. ? Raise (elevate) your feet for 15 minutes, 3-4 times a day. ? Limit salt in your food. Prenatal care  Schedule your prenatal visits by the twelfth week of pregnancy.  Write down your questions. Take them to your prenatal visits.  Keep all your prenatal visits as told by your doctor. This is important. Safety  Wear your seat belt at all times when driving.  Make a list of emergency phone numbers. The list should include numbers for family, friends, the hospital, and police and fire departments. General instructions  Ask your doctor for a referral to a local prenatal class. Begin classes no later than at the start of month 6 of your pregnancy.  Ask for help if you need counseling or if you need help with nutrition. Your doctor can give you advice or tell you where to go for help.  Do not use hot tubs, steam   rooms, or saunas.  Do not douche or use tampons or scented sanitary pads.  Do not cross your legs for long periods of time.  Avoid all herbs and alcohol. Avoid drugs that are not approved by your doctor.  Do not use any tobacco products, including cigarettes, chewing tobacco, and electronic cigarettes. If you need help quitting, ask your doctor. You may get counseling or other support to help you quit.  Avoid cat litter boxes and soil used by cats. These carry germs that can cause birth defects in the baby and can cause a loss of your baby (miscarriage) or stillbirth.  Visit your dentist.  At home, brush your teeth with a soft toothbrush. Be gentle when you floss. Contact a doctor if:  You are dizzy.  You have mild cramps or pressure in your lower belly.  You have a nagging pain in your belly area.  You continue to feel sick to your stomach, you throw up, or you have watery poop (diarrhea).  You have a bad smelling fluid coming from your vagina.  You have pain when you pee (urinate).  You have increased puffiness (swelling) in your face, hands, legs, or ankles. Get help right away if:  You have a fever.  You are leaking fluid from your vagina.  You have spotting or bleeding from your vagina.  You have very bad belly cramping or pain.  You gain or lose weight rapidly.  You throw up blood. It may look like coffee grounds.  You are around people who have [place] measles, fifth disease, or chickenpox.  You have a very bad headache.  You have shortness of breath.  You have any kind of trauma, such as from a fall or a car accident. Summary  The first trimester of pregnancy is from week 1 until the end of week 13 (months 1 through 3).  To take care of yourself and your unborn baby, you will need to eat healthy meals, take medicines only if your doctor tells you to do so, and do activities that are safe for you and your baby.  Keep all follow-up visits as told by your doctor. This is important as your doctor will have to ensure that your baby is healthy and growing well. This information is not intended to replace advice given to you by your health care provider. Make sure you discuss any questions you have with your health care provider. Document Revised: 06/02/2018 Document Reviewed: 02/18/2016 Elsevier Patient Education  2020 [employer] Vaginal Yeast Infection, Adult  Vaginal yeast infection is a condition that causes vaginal discharge as well as soreness, swelling, and redness (inflammation) of the vagina. This is a common condition. Some women get this  infection frequently. What are the causes? This condition is caused by a change in the normal balance of the yeast (candida) and bacteria that live in the vagina. This change causes an overgrowth of yeast, which causes the inflammation. What increases the risk? The condition is more likely to develop in women who:  Take antibiotic medicines.  Have diabetes.  Take birth control pills.  Are pregnant.  Douche often.  Have a weak body defense system (immune system).  Have been taking steroid medicines for a long time.  Frequently wear tight clothing. What are the signs or symptoms? Symptoms of this condition include:  White, thick, creamy vaginal discharge.  Swelling, itching, redness, and irritation of the vagina. The lips of the vagina (vulva) may be affected as well.  Pain or  a burning feeling while urinating.  Pain during sex. How is this diagnosed? This condition is diagnosed based on:  Your medical history.  A physical exam.  A pelvic exam. Your health care provider will examine a sample of your vaginal discharge under a microscope. Your health care provider may send this sample for testing to confirm the diagnosis. How is this treated? This condition is treated with medicine. Medicines may be over-the-counter or prescription. You may be told to use one or more of the following:  Medicine that is taken by mouth (orally).  Medicine that is applied as a cream (topically).  Medicine that is inserted directly into the vagina (suppository). Follow these instructions at home:  Lifestyle  Do not have sex until your health care provider approves. Tell your sex partner that you have a yeast infection. That person should go to his or her health care provider and ask if they should also be treated.  Do not wear tight clothes, such as pantyhose or tight pants.  Wear breathable cotton underwear. General instructions  Take or apply over-the-counter and prescription  medicines only as told by your health care provider.  Eat more yogurt. This may help to keep your yeast infection from returning.  Do not use tampons until your health care provider approves.  Try taking a sitz bath to help with discomfort. This is a warm water bath that is taken while you are sitting down. The water should only come up to your hips and should cover your buttocks. Do this 3-4 times per day or as told by your health care provider.  Do not douche.  If you have diabetes, keep your blood sugar levels under control.  Keep all follow-up visits as told by your health care provider. This is important. Contact a health care provider if:  You have a fever.  Your symptoms go away and then return.  Your symptoms do not get better with treatment.  Your symptoms get worse.  You have new symptoms.  You develop blisters in or around your vagina.  You have blood coming from your vagina and it is not your menstrual period.  You develop pain in your abdomen. Summary  Vaginal yeast infection is a condition that causes discharge as well as soreness, swelling, and redness (inflammation) of the vagina.  This condition is treated with medicine. Medicines may be over-the-counter or prescription.  Take or apply over-the-counter and prescription medicines only as told by your health care provider.  Do not douche. Do not have sex or use tampons until your health care provider approves.  Contact a health care provider if your symptoms do not get better with treatment or your symptoms go away and then return. This information is not intended to replace advice given to you by your health care provider. Make sure you discuss any questions you have with your health care provider. Document Revised: 09/09/2018 Document Reviewed: 06/28/2017 Elsevier Patient Education  2020 [employer].

## 2019-07-13 NOTE — MAU Provider Note (Signed)
Chief Complaint: Vaginal Discharge, Abdominal Pain, and Morning Sickness   First Provider Initiated Contact with Patient 07/13/19 2222        SUBJECTIVE HPI: Anita Robinson is a 24 y.o. G3P1011 at [redacted]w[redacted]d by LMP who presents to maternity admissions reporting white discharge with itching.  Has not tried anything for it. Also having some pelvic cramping.  Had an [place] on 5/6 showing a [redacted]w[redacted]d fetus with HR 94.   . She denies vaginal bleeding, vaginal itching/burning, urinary symptoms, h/a, dizziness, n/v, or fever/chills.    Vaginal Discharge The patient's primary symptoms include genital itching, pelvic pain and vaginal discharge. The patient's pertinent negatives include no genital lesions, genital odor or vaginal bleeding. This is a new problem. The current episode started in the past 7 days. The problem occurs constantly. The problem has been unchanged. The pain is mild. She is pregnant. Associated symptoms include abdominal pain and nausea. Pertinent negatives include no back pain, chills, constipation, diarrhea, dysuria, fever, frequency, headaches or vomiting. The vaginal discharge was white and thick. There has been no bleeding. She has not been passing clots. She has not been passing tissue. Nothing aggravates the symptoms. She has tried nothing for the symptoms.  Abdominal Pain This is a recurrent problem. The current episode started in the past 7 days. The onset quality is gradual. The problem occurs intermittently. The problem has been unchanged. The pain is located in the suprapubic region. The pain is mild. The quality of the pain is cramping. Associated symptoms include nausea. Pertinent negatives include no constipation, diarrhea, dysuria, fever, frequency, headaches or vomiting. Nothing aggravates the pain. The pain is relieved by nothing. She has tried nothing for the symptoms.   RN Note: White vag d/c since yesterday. D/C is like cottage cheese with vag itching. No odor. Mild cramping. Nausea  but no meds at home for nausea  Past Medical History:  Diagnosis Date  . Anemia    on Iron  . Complication of anesthesia    has never had any type of anesthesia.  . Low iron    Past Surgical History:  Procedure Laterality Date  . NO PAST SURGERIES     Social History   Socioeconomic History  . Marital status: Single    Spouse name: Not on file  . Number of children: Not on file  . Years of education: Not on file  . Highest education level: Not on file  Occupational History  . Not on file  Tobacco Use  . Smoking status: Never Smoker  . Smokeless tobacco: Never Used  Substance and Sexual Activity  . Alcohol use: Not Currently    Comment: social drinker  . Drug use: No  . Sexual activity: Yes    Birth control/protection: None  Other Topics Concern  . Not on file  Social History Narrative  . Not on file   Social Determinants of Health   Financial Resource Strain:   . Difficulty of Paying Living Expenses:   Food Insecurity:   . Worried About Programme researcher, broadcasting/film/video in the Last Year:   . Barista in the Last Year:   Transportation Needs:   . Freight forwarder (Medical):   Marland Kitchen Lack of Transportation (Non-Medical):   Physical Activity:   . Days of Exercise per Week:   . Minutes of Exercise per Session:   Stress:   . Feeling of Stress :   Social Connections:   . Frequency of Communication with Friends and Family:   .  Frequency of Social Gatherings with Friends and Family:   . Attends Religious Services:   . Active Member of Clubs or Organizations:   . Attends Banker Meetings:   Marland Kitchen Marital Status:   Intimate Partner Violence:   . Fear of Current or Ex-Partner:   . Emotionally Abused:   Marland Kitchen Physically Abused:   . Sexually Abused:    No current facility-administered medications on file prior to encounter.   Current Outpatient Medications on File Prior to Encounter  Medication Sig Dispense Refill  . acetaminophen (TYLENOL) 500 MG tablet Take 1  tablet (500 mg total) by mouth every 6 (six) hours as needed. 30 tablet 0   Allergies  Allergen Reactions  . Eggs Or Egg-Derived Products     Pt says she get bumps     I have reviewed patient's Past Medical Hx, Surgical Hx, Family Hx, Social Hx, medications and allergies.   ROS:  Review of Systems  Constitutional: Negative for chills and fever.  Gastrointestinal: Positive for abdominal pain and nausea. Negative for constipation, diarrhea and vomiting.  Genitourinary: Positive for pelvic pain and vaginal discharge. Negative for dysuria and frequency.  Musculoskeletal: Negative for back pain.  Neurological: Negative for headaches.   Review of Systems  Other systems negative   Physical Exam  Physical Exam Patient Vitals for the past 24 hrs:  BP Temp Pulse Resp Height Weight  07/13/19 1924 108/67 99.3 F (37.4 C) 86 16 5\' 5"  (1.651 m) 75.3 kg   Constitutional: Well-developed, well-nourished female in no acute distress.  Cardiovascular: normal rate Respiratory: normal effort GI: Abd soft, non-tender. Pos BS x 4 MS: Extremities nontender, no edema, normal ROM Neurologic: Alert and oriented x 4.  GU: Neg CVAT.  PELVIC EXAM: Cervix pink, visually closed, without lesion, scant white creamy discharge, vaginal walls and external genitalia normal   LAB RESULTS Results for orders placed or performed during the hospital encounter of 07/13/19 (from the past 24 hour(s))  Urinalysis, Routine w reflex microscopic     Status: Abnormal   Collection Time: 07/13/19  8:29 PM  Result Value Ref Range   Color, Urine YELLOW YELLOW   APPearance CLEAR CLEAR   Specific Gravity, Urine 1.025 1.005 - 1.030   pH 6.0 5.0 - 8.0   Glucose, UA NEGATIVE NEGATIVE mg/dL   Hgb urine dipstick NEGATIVE NEGATIVE   Bilirubin Urine NEGATIVE NEGATIVE   Ketones, ur NEGATIVE NEGATIVE mg/dL   Protein, ur 30 (A) NEGATIVE mg/dL   Nitrite NEGATIVE NEGATIVE   Leukocytes,Ua NEGATIVE NEGATIVE   RBC / HPF 0-5 0 - 5  RBC/hpf   WBC, UA 0-5 0 - 5 WBC/hpf   Bacteria, UA NONE SEEN NONE SEEN   Squamous Epithelial / LPF 0-5 0 - 5   Mucus PRESENT   Wet prep, genital     Status: Abnormal   Collection Time: 07/13/19 10:25 PM  Result Value Ref Range   Yeast Wet Prep HPF POC NONE SEEN NONE SEEN   Trich, Wet Prep NONE SEEN NONE SEEN   Clue Cells Wet Prep HPF POC NONE SEEN NONE SEEN   WBC, Wet Prep HPF POC MODERATE (A) NONE SEEN   Sperm NONE SEEN        IMAGING Pt informed that the ultrasound is considered a limited OB ultrasound and is not intended to be a complete ultrasound exam.  Patient also informed that the ultrasound is not being completed with the intent of assessing for fetal or placental anomalies or any  pelvic abnormalities.  Explained that the purpose of today's ultrasound is to assess for fetal viability.  Patient acknowledges the purpose of the exam and the limitations of the study.    S[place]gle intrauterine pregnancy seen, normal gestational sac Single live fetus, FHR 160s Normal yolk sac seen  MAU Management/MDM: Reassured patient that fetus is developing well Will treat for yeast presumptively even though wet prep negative.   ASSESSMENT Single intrauterine pregnancy at [redacted]w[redacted]d Vaginal discharge with itching Presumed yeast vaginitis  PLAN Discharge home Rx Terazol 7 for yeast Followup with prenatal care  Pt stable at time of discharge. Encouraged to return here or to other Urgent Care/ED if she develops worsening of symptoms, increase in pain, fever, or other concerning symptoms.    Hansel Feinstein CNM, MSN Certified Nurse-Midwife 07/13/2019  10:22 PM

## 2019-07-13 NOTE — MAU Note (Addendum)
White vag d/c since yesterday. D/C is like cottage cheese with vag itching. No odor. Mild cramping. Nausea but no meds at home for nausea

## 2019-07-28 ENCOUNTER — Other Ambulatory Visit: Payer: Self-pay

## 2019-07-28 ENCOUNTER — Inpatient Hospital Stay (HOSPITAL_COMMUNITY)
Admission: AD | Admit: 2019-07-28 | Discharge: 2019-07-28 | Disposition: A | Discharge status: Home / Self Care | Payer: Medicaid Other | Attending: Obstetrics and Gynecology | Admitting: Obstetrics and Gynecology

## 2019-07-28 DIAGNOSIS — N898 Other specified noninflammatory disorders of vagina: Secondary | ICD-10-CM | POA: Diagnosis present

## 2019-07-28 DIAGNOSIS — O219 Vomiting of pregnancy, unspecified: Secondary | ICD-10-CM

## 2019-07-28 DIAGNOSIS — O98311 Other infections with a predominantly sexual mode of transmission complicating pregnancy, first trimester: Secondary | ICD-10-CM | POA: Insufficient documentation

## 2019-07-28 DIAGNOSIS — A599 Trichomoniasis, unspecified: Secondary | ICD-10-CM | POA: Insufficient documentation

## 2019-07-28 DIAGNOSIS — O26891 Other specified pregnancy related conditions, first trimester: Secondary | ICD-10-CM

## 2019-07-28 DIAGNOSIS — Z3A09 9 weeks gestation of pregnancy: Secondary | ICD-10-CM | POA: Diagnosis not present

## 2019-07-28 DIAGNOSIS — Z825 Family history of asthma and other chronic lower respiratory diseases: Secondary | ICD-10-CM | POA: Diagnosis not present

## 2019-07-28 LAB — URINALYSIS, ROUTINE W REFLEX MICROSCOPIC
Bilirubin Urine: NEGATIVE
Glucose, UA: NEGATIVE mg/dL
Hgb urine dipstick: NEGATIVE
Ketones, ur: NEGATIVE mg/dL
Leukocytes,Ua: NEGATIVE
Nitrite: NEGATIVE
Protein, ur: 30 mg/dL — AB
Specific Gravity, Urine: 1.028 (ref 1.005–1.030)
pH: 6 (ref 5.0–8.0)

## 2019-07-28 LAB — WET PREP, GENITAL
Clue Cells Wet Prep HPF POC: NONE SEEN
Sperm: NONE SEEN
Trich, Wet Prep: NONE SEEN
Yeast Wet Prep HPF POC: NONE SEEN

## 2019-07-28 MED ORDER — DOXYLAMINE-PYRIDOXINE 10-10 MG PO TBEC: DELAYED_RELEASE_TABLET | ORAL | 0 refills | Status: DC

## 2019-07-28 MED ORDER — METOCLOPRAMIDE HCL 10 MG PO TABS
10.0000 mg | ORAL_TABLET | Freq: Once | ORAL | Status: AC
  Administered 2019-07-28: 10 mg via ORAL
  Filled 2019-07-28: qty 1

## 2019-07-28 NOTE — MAU Note (Signed)
Increase in nausea/vomiting lately. Also noted an increase in vag d/c(no odor, irritation or itching)

## 2019-07-28 NOTE — MAU Provider Note (Signed)
Chief Complaint: Vaginal Discharge, Emesis, and Nausea   First Provider Initiated Contact with Patient 07/28/19 1611     SUBJECTIVE HPI: Anita Robinson is a 24 y.o. G3P1011 at [redacted]w[redacted]d who presents to Maternity Admissions reporting nausea/vaginal discharge.  Does not have antiemetic at home. Reports daily nausea; vomits 1-2 times per day. Also reports an increase in vaginal discharge. Has a slight abnormal odor to it. No itching or irritation. Denies any other symptoms. Plans on going to [facility] for prenatal care; appointment later this month.    Past Medical History:  Diagnosis Date   Anemia    on Iron   Complication of anesthesia    has never had any type of anesthesia.   Low iron    OB History  Gravida Para Term Preterm AB Living  3 1 1  0 1 1  SAB TAB Ectopic Multiple Live Births  1 0 0 0 1    # Outcome Date GA Lbr Len/2nd Weight Sex Delivery Anes PTL Lv  3 Current           2 Term 12/23/13 [redacted]w[redacted]d -16:59 / 01:15 3189 g M Vag-Spont EPI  LIV  1 SAB  [redacted]w[redacted]d          Past Surgical History:  Procedure Laterality Date   NO PAST SURGERIES     Social History   Socioeconomic History   Marital status: Single    Spouse name: Not on file   Number of children: Not on file   Years of education: Not on file   Highest education level: Not on file  Occupational History   Not on file  Tobacco Use   Smoking status: Never Smoker   Smokeless tobacco: Never Used  Substance and Sexual Activity   Alcohol use: Not Currently    Comment: social drinker   Drug use: No   Sexual activity: Yes    Birth control/protection: None  Other Topics Concern   Not on file  Social History Narrative   Not on file   Social Determinants of Health   Financial Resource Strain:    Difficulty of Paying Living Expenses:   Food Insecurity:    Worried About [redacted]w[redacted]d in the Last Year:    Programme researcher, broadcasting/film/video in the Last Year:   Transportation Needs:    Barista (Medical):     Lack of Transportation (Non-Medical):   Physical Activity:    Days of Exercise per Week:    Minutes of Exercise per Session:   Stress:    Feeling of Stress :   Social Connections:    Frequency of Communication with Friends and Family:    Frequency of Social Gatherings with Friends and Family:    Attends Religious Services:    Active Member of Clubs or Organizations:    Attends Freight forwarder:    Marital Status:   Intimate Partner Violence:    Fear of Current or Ex-Partner:    Emotionally Abused:    Physically Abused:    Sexually Abused:    Family History  Problem Relation Age of Onset   Asthma Mother    Asthma Sister    Asthma Brother    Alcohol abuse Neg Hx    Arthritis Neg Hx    Birth defects Neg Hx    Cancer Neg Hx    COPD Neg Hx    Depression Neg Hx    Diabetes Neg Hx    Drug abuse Neg Hx  Early death Neg Hx    Hearing loss Neg Hx    Heart disease Neg Hx    Hyperlipidemia Neg Hx    Hypertension Neg Hx    Kidney disease Neg Hx    Learning disabilities Neg Hx    Mental illness Neg Hx    Mental retardation Neg Hx    Miscarriages / Stillbirths Neg Hx    Stroke Neg Hx    Vision loss Neg Hx    Varicose Veins Neg Hx    No current facility-administered medications on file prior to encounter.   Current Outpatient Medications on File Prior to Encounter  Medication Sig Dispense Refill   Prenatal Vit-Fe Fumarate-FA (PRENATAL MULTIVITAMIN) TABS tablet Take 1 tablet by mouth daily at 12 noon.     acetaminophen (TYLENOL) 500 MG tablet Take 1 tablet (500 mg total) by mouth every 6 (six) hours as needed. 30 tablet 0   terconazole (TERAZOL 7) 0.4 % vaginal cream Place 1 applicator vaginally at bedtime. 45 g 0   Allergies  Allergen Reactions   Eggs Or Egg-Derived Products     Pt says she get bumps     I have reviewed patient's Past Medical Hx, Surgical Hx, Family Hx, Social Hx, medications and allergies.    Review of Systems  Constitutional: Negative.   Gastrointestinal: Positive for nausea and vomiting. Negative for abdominal pain, constipation and diarrhea.  Genitourinary: Positive for vaginal discharge. Negative for dysuria and vaginal bleeding.    OBJECTIVE Patient Vitals for the past 24 hrs:  BP Temp Temp src Pulse Resp SpO2 Height Weight  07/28/19 1532 (!) 108/56 98.9 F (37.2 C) Oral 85 18 100 % 5\' 5"  (1.651 m) 76.2 kg   Constitutional: Well-developed, well-nourished female in no acute distress.  Cardiovascular: normal rate & rhythm, no murmur Respiratory: normal rate and effort. Lung sounds clear throughout GI: Abd soft, non-tender, Pos BS x 4. No guarding or rebound tenderness MS: Extremities nontender, no edema, normal ROM Neurologic: Alert and oriented x 4.    LAB RESULTS Results for orders placed or performed during the hospital encounter of 07/28/19 (from the past 24 hour(s))  Urinalysis, Routine w reflex microscopic     Status: Abnormal   Collection Time: 07/28/19  4:16 PM  Result Value Ref Range   Color, Urine YELLOW YELLOW   APPearance HAZY (A) CLEAR   Specific Gravity, Urine 1.028 1.005 - 1.030   pH 6.0 5.0 - 8.0   Glucose, UA NEGATIVE NEGATIVE mg/dL   Hgb urine dipstick NEGATIVE NEGATIVE   Bilirubin Urine NEGATIVE NEGATIVE   Ketones, ur NEGATIVE NEGATIVE mg/dL   Protein, ur 30 (A) NEGATIVE mg/dL   Nitrite NEGATIVE NEGATIVE   Leukocytes,Ua NEGATIVE NEGATIVE   RBC / HPF 0-5 0 - 5 RBC/hpf   WBC, UA 0-5 0 - 5 WBC/hpf   Bacteria, UA RARE (A) NONE SEEN   Squamous Epithelial / LPF 6-10 0 - 5   Mucus PRESENT   Wet prep, genital     Status: Abnormal   Collection Time: 07/28/19  4:18 PM   Specimen: PATH Cytology Cervicovaginal Ancillary Only  Result Value Ref Range   Yeast Wet Prep HPF POC NONE SEEN NONE SEEN   Trich, Wet Prep NONE SEEN NONE SEEN   Clue Cells Wet Prep HPF POC NONE SEEN NONE SEEN   WBC, Wet Prep HPF POC FEW (A) NONE SEEN   Sperm NONE SEEN      IMAGING No results found.  MAU COURSE Orders  Placed This Encounter  Procedures   Wet prep, genital   Urinalysis, Routine w reflex microscopic   Discharge patient   Meds ordered this encounter  Medications   metoCLOPramide (REGLAN) tablet 10 mg   Doxylamine-Pyridoxine 10-10 MG TBEC    Sig: Start with 2 tablets every evening. If symptoms persist, add 1 tablet every morning. If symptoms persist, add 1 tablet at mid-day.    Dispense:  120 tablet    Refill:  0    Order Specific Question:   Supervising Provider    Answer:   Woodroe Mode [[id number]]    MDM Reglan 10 mg po given in mau No vomiting while in MAU Will send home with rx diclegis  Wet prep negative. GC/CT pending  ASSESSMENT 1. Nausea and vomiting during pregnancy prior to [redacted] weeks gestation   2. [redacted] weeks gestation of pregnancy   3. Vaginal discharge during pregnancy in first trimester   4. Trichomoniasis     PLAN Discharge home in stable condition. Rx diclegis GC/CT pending F/u with ob/gyn   [facility] Follow up.   Specialty: Obstetrics and Gynecology Contact information: [address] [place] [id number] [phone]         Allergies as of 07/28/2019      Reactions   Eggs Or Egg-derived Products    Pt says she get bumps       Medication List    STOP taking these medications   acetaminophen 500 MG tablet Commonly known as: TYLENOL   terconazole 0.4 % vaginal cream Commonly known as: Terazol 7     TAKE these medications   Doxylamine-Pyridoxine 10-10 MG Tbec Start with 2 tablets every evening. If symptoms persist, add 1 tablet every morning. If symptoms persist, add 1 tablet at mid-day.   prenatal multivitamin Tabs tablet Take 1 tablet by mouth daily at 12 noon.        Jorje Guild, NP 07/28/2019  5:23 PM

## 2019-07-28 NOTE — Discharge Instructions (Signed)

## 2019-07-31 LAB — GC/CHLAMYDIA PROBE AMP (~~LOC~~) NOT AT ARMC
Chlamydia: NEGATIVE
Comment: NEGATIVE
Comment: NORMAL
Neisseria Gonorrhea: NEGATIVE

## 2019-08-05 ENCOUNTER — Encounter (HOSPITAL_COMMUNITY): Payer: Self-pay | Admitting: Family Medicine

## 2019-08-05 ENCOUNTER — Other Ambulatory Visit: Payer: Self-pay

## 2019-08-05 ENCOUNTER — Inpatient Hospital Stay (HOSPITAL_COMMUNITY)
Admission: AD | Admit: 2019-08-05 | Discharge: 2019-08-05 | Disposition: A | Discharge status: Home / Self Care | Payer: Medicaid Other | Attending: Family Medicine | Admitting: Family Medicine

## 2019-08-05 DIAGNOSIS — B9689 Other specified bacterial agents as the cause of diseases classified elsewhere: Secondary | ICD-10-CM

## 2019-08-05 DIAGNOSIS — O23591 Infection of other part of genital tract in pregnancy, first trimester: Secondary | ICD-10-CM

## 2019-08-05 DIAGNOSIS — Z3A11 11 weeks gestation of pregnancy: Secondary | ICD-10-CM

## 2019-08-05 LAB — WET PREP, GENITAL
Sperm: NONE SEEN
Trich, Wet Prep: NONE SEEN
Yeast Wet Prep HPF POC: NONE SEEN

## 2019-08-05 MED ORDER — METRONIDAZOLE 500 MG PO TABS
500.0000 mg | ORAL_TABLET | Freq: Two times a day (BID) | ORAL | 0 refills | Status: DC
Start: 2019-08-05 — End: 2019-09-06

## 2019-08-05 NOTE — Discharge Instructions (Signed)
Safe Medications in Pregnancy   Acne: Benzoyl Peroxide Salicylic Acid  Backache/Headache: Tylenol: 2 regular strength every 4 hours OR              2 Extra strength every 6 hours  Colds/Coughs/Allergies: Benadryl (alcohol free) 25 mg every 6 hours as needed Breath right strips Claritin Cepacol throat lozenges Chloraseptic throat spray Cold-Eeze- up to three times per day Cough drops, alcohol free Flonase (by prescription only) Guaifenesin Mucinex Robitussin DM (plain only, alcohol free) Saline nasal spray/drops Sudafed (pseudoephedrine) & Actifed ** use only after [redacted] weeks gestation and if you do not have high blood pressure Tylenol Vicks Vaporub Zinc lozenges Zyrtec   Constipation: Colace Ducolax suppositories Fleet enema Glycerin suppositories Metamucil Milk of magnesia Miralax Senokot Smooth move tea  Diarrhea: Kaopectate Imodium A-D  *NO pepto Bismol  Hemorrhoids: Anusol Anusol HC Preparation H Tucks  Indigestion: Tums Maalox Mylanta Zantac  Pepcid  Insomnia: Benadryl (alcohol free) 25mg  every 6 hours as needed Tylenol PM Unisom, no Gelcaps  Leg Cramps: Tums MagGel  Nausea/Vomiting:  Bonine Dramamine Emetrol Ginger extract Sea bands Meclizine  Nausea medication to take during pregnancy:  Unisom (doxylamine succinate 25 mg tablets) Take one tablet daily at bedtime. If symptoms are not adequately controlled, the dose can be increased to a maximum recommended dose of two tablets daily (1/2 tablet in the morning, 1/2 tablet mid-afternoon and one at bedtime). Vitamin B6 100mg  tablets. Take one tablet twice a day (up to 200 mg per day).  Skin Rashes: Aveeno products Benadryl cream or 25mg  every 6 hours as needed Calamine Lotion 1% cortisone cream  Yeast infection: Gyne-lotrimin 7 Monistat 7   **If taking multiple medications, please check labels to avoid duplicating the same active ingredients **take medication as directed on  the label ** Do not exceed 4000 mg of tylenol in 24 hours **Do not take medications that contain aspirin or ibuprofen     Bacterial Vaginosis  Bacterial vaginosis is an infection of the vagina. It happens when too many normal germs (healthy bacteria) grow in the vagina. This infection puts you at risk for infections from sex (STIs). Treating this infection can lower your risk for some STIs. You should also treat this if you are pregnant. It can cause your baby to be born early. Follow these instructions at home: Medicines  Take over-the-counter and prescription medicines only as told by your doctor.  Take or use your antibiotic medicine as told by your doctor. Do not stop taking or using it even if you start to feel better. General instructions  If you your sexual partner is a woman, tell her that you have this infection. She needs to get treatment if she has symptoms. If you have a female partner, he does not need to be treated.  During treatment: ? Avoid sex. ? Do not douche. ? Avoid alcohol as told. ? Avoid breastfeeding as told.  Drink enough fluid to keep your pee (urine) clear or pale yellow.  Keep your vagina and butt (rectum) clean. ? Wash the area with warm water every day. ? Wipe from front to back after you use the toilet.  Keep all follow-up visits as told by your doctor. This is important. Preventing this condition  Do not douche.  Use only warm water to wash around your vagina.  Use protection when you have sex. This includes: ? Latex condoms. ? Dental dams.  Limit how many people you have sex with. It is best to only have  sex with the same person (be monogamous).  Get tested for STIs. Have your partner get tested.  Wear underwear that is cotton or lined with cotton.  Avoid tight pants and pantyhose. This is most important in summer.  Do not use any products that have nicotine or tobacco in them. These include cigarettes and e-cigarettes. If you need  help quitting, ask your doctor.  Do not use illegal drugs.  Limit how much alcohol you drink. Contact a doctor if:  Your symptoms do not get better, even after you are treated.  You have more discharge or pain when you pee (urinate).  You have a fever.  You have pain in your belly (abdomen).  You have pain with sex.  Your bleed from your vagina between periods. Summary  This infection happens when too many germs (bacteria) grow in the vagina.  Treating this condition can lower your risk for some infections from sex (STIs).  You should also treat this if you are pregnant. It can cause early (premature) birth.  Do not stop taking or using your antibiotic medicine even if you start to feel better. This information is not intended to replace advice given to you by your health care provider. Make sure you discuss any questions you have with your health care provider. Document Revised: 01/22/2017 Document Reviewed: 10/26/2015 Elsevier Patient Education  2020 [employer].

## 2019-08-05 NOTE — MAU Note (Signed)
Anita Robinson is a 24 y.o. at [redacted]w[redacted]d here in MAU reporting: has been having vaginal discharge, was here last week. States she thinks she has BV. Reports white discharge that is thick and has an odor. No bleeding. Started having upper left abdominal pain today.  Onset of complaint: ongoing  Pain score: 3/10  Vitals:   08/05/19 1238  BP: 108/60  Pulse: 88  Resp: 17  Temp: 99.2 F (37.3 C)  SpO2: 100%     FHT: 166  Lab orders placed from triage: UA, unable to give sample at this time

## 2019-08-05 NOTE — MAU Provider Note (Signed)
History     CSN: [id number]  Arrival date and time: 08/05/19 1214   First Provider Initiated Contact with Patient 08/05/19 1257      Chief Complaint  Patient presents with  . Abdominal Pain  . Vaginal Discharge   HPI Anita Robinson is a 24 y.o. G3P1011 at [redacted]w[redacted]d who presents stating she thinks she has BV. She reports intermittent foul smelling discharge and an increased amount of discharge. She denies any pain or bleeding. She was seen on 6/4 and had negative swabs but is requesting repeat.   OB History    Gravida  3   Para  1   Term  1   Preterm  0   AB  1   Living  1     SAB  1   TAB  0   Ectopic  0   Multiple  0   Live Births  1           Past Medical History:  Diagnosis Date  . Anemia    on Iron  . Complication of anesthesia    has never had any type of anesthesia.  . Low iron     Past Surgical History:  Procedure Laterality Date  . NO PAST SURGERIES      Family History  Problem Relation Age of Onset  . Asthma Mother   . Asthma Sister   . Asthma Brother   . Alcohol abuse Neg Hx   . Arthritis Neg Hx   . Birth defects Neg Hx   . Cancer Neg Hx   . COPD Neg Hx   . Depression Neg Hx   . Diabetes Neg Hx   . Drug abuse Neg Hx   . Early death Neg Hx   . Hearing loss Neg Hx   . Heart disease Neg Hx   . Hyperlipidemia Neg Hx   . Hypertension Neg Hx   . Kidney disease Neg Hx   . Learning disabilities Neg Hx   . Mental illness Neg Hx   . Mental retardation Neg Hx   . Miscarriages / Stillbirths Neg Hx   . Stroke Neg Hx   . Vision loss Neg Hx   . Varicose Veins Neg Hx     Social History   Tobacco Use  . Smoking status: Never Smoker  . Smokeless tobacco: Never Used  Substance Use Topics  . Alcohol use: Not Currently    Comment: social drinker  . Drug use: No    Allergies:  Allergies  Allergen Reactions  . Eggs Or Egg-Derived Products     Pt says she get bumps     Medications Prior to Admission  Medication Sig Dispense  Refill Last Dose  . Doxylamine-Pyridoxine 10-10 MG TBEC Start with 2 tablets every evening. If symptoms persist, add 1 tablet every morning. If symptoms persist, add 1 tablet at mid-day. 120 tablet 0   . Prenatal Vit-Fe Fumarate-FA (PRENATAL MULTIVITAMIN) TABS tablet Take 1 tablet by mouth daily at 12 noon.       Review of Systems  Constitutional: Negative.  Negative for fatigue and fever.  HENT: Negative.   Respiratory: Negative.  Negative for shortness of breath.   Cardiovascular: Negative.  Negative for chest pain.  Gastrointestinal: Negative.  Negative for abdominal pain, constipation, diarrhea, nausea and vomiting.  Genitourinary: Positive for vaginal discharge. Negative for dysuria and vaginal bleeding.  Neurological: Negative.  Negative for dizziness and headaches.   Physical Exam   Blood pressure 108/60,  pulse 88, temperature 99.2 F (37.3 C), temperature source Oral, resp. rate 17, height 5\' 5"  (1.651 m), weight 76.3 kg, last menstrual period 05/02/2019, SpO2 100 %, unknown if currently breastfeeding.  Physical Exam  Nursing note and vitals reviewed. Constitutional: She is oriented to person, place, and time. She appears well-developed. No distress.  HENT:  Head: Normocephalic.  Eyes: Pupils are equal, round, and reactive to light.  Cardiovascular: Normal rate, regular rhythm and normal heart sounds.  Respiratory: Effort normal and breath sounds normal. No respiratory distress.  GI: Soft. Bowel sounds are normal. She exhibits no distension. There is no abdominal tenderness.  Genitourinary:    Genitourinary Comments: SSE: scant creamy white discharge, no odor noted   Neurological: She is alert and oriented to person, place, and time.  Skin: Skin is warm and dry.  Psychiatric: Her behavior is normal. Judgment and thought content normal.    MAU Course  Procedures Results for orders placed or performed during the hospital encounter of 08/05/19 (from the past 24 hour(s))   Wet prep, genital     Status: Abnormal   Collection Time: 08/05/19  1:05 PM  Result Value Ref Range   Yeast Wet Prep HPF POC NONE SEEN NONE SEEN   Trich, Wet Prep NONE SEEN NONE SEEN   Clue Cells Wet Prep HPF POC PRESENT (A) NONE SEEN   WBC, Wet Prep HPF POC MODERATE (A) NONE SEEN   Sperm NONE SEEN    MDM Wet prep  Assessment and Plan   1. Bacterial vaginosis   2. [redacted] weeks gestation of pregnancy    -Discharge home in stable condition -Rx for metronidazole sent to patient's pharmacy -First trimester precautions discussed -Patient advised to follow-up with OB to start prenatal care -Patient may return to MAU as needed or if her condition were to change or worsen   10/05/19 CNM 08/05/2019, 12:58 PM

## 2019-08-14 ENCOUNTER — Encounter (HOSPITAL_COMMUNITY): Payer: Self-pay | Admitting: Obstetrics and Gynecology

## 2019-08-14 ENCOUNTER — Inpatient Hospital Stay (HOSPITAL_COMMUNITY)
Admission: AD | Admit: 2019-08-14 | Discharge: 2019-08-14 | Disposition: A | Discharge status: Home / Self Care | Payer: Medicaid Other | Attending: Obstetrics and Gynecology | Admitting: Obstetrics and Gynecology

## 2019-08-14 ENCOUNTER — Other Ambulatory Visit: Payer: Self-pay

## 2019-08-14 DIAGNOSIS — R1031 Right lower quadrant pain: Secondary | ICD-10-CM | POA: Diagnosis not present

## 2019-08-14 DIAGNOSIS — R1012 Left upper quadrant pain: Secondary | ICD-10-CM

## 2019-08-14 DIAGNOSIS — O26891 Other specified pregnancy related conditions, first trimester: Secondary | ICD-10-CM | POA: Insufficient documentation

## 2019-08-14 DIAGNOSIS — Z3A12 12 weeks gestation of pregnancy: Secondary | ICD-10-CM

## 2019-08-14 DIAGNOSIS — Z825 Family history of asthma and other chronic lower respiratory diseases: Secondary | ICD-10-CM | POA: Insufficient documentation

## 2019-08-14 DIAGNOSIS — R109 Unspecified abdominal pain: Secondary | ICD-10-CM | POA: Diagnosis present

## 2019-08-14 DIAGNOSIS — Z3491 Encounter for supervision of normal pregnancy, unspecified, first trimester: Secondary | ICD-10-CM

## 2019-08-14 DIAGNOSIS — Z91012 Allergy to eggs: Secondary | ICD-10-CM | POA: Insufficient documentation

## 2019-08-14 DIAGNOSIS — O99891 Other specified diseases and conditions complicating pregnancy: Secondary | ICD-10-CM | POA: Diagnosis not present

## 2019-08-14 MED ORDER — ACETAMINOPHEN 325 MG PO TABS
650.0000 mg | ORAL_TABLET | ORAL | 2 refills | Status: AC | PRN
Start: 2019-08-14 — End: 2020-08-13
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
## 2019-08-15 ENCOUNTER — Ambulatory Visit (INDEPENDENT_AMBULATORY_CARE_PROVIDER_SITE_OTHER): Payer: Medicaid Other | Admitting: *Deleted

## 2019-08-15 ENCOUNTER — Telehealth: Payer: Self-pay | Admitting: Family Medicine

## 2019-08-15 DIAGNOSIS — Z3490 Encounter for supervision of normal pregnancy, unspecified, unspecified trimester: Secondary | ICD-10-CM

## 2019-08-15 NOTE — Progress Notes (Addendum)
3:15 I called Cathryn for her telephone visit for new ob intake and as she was confirming her name - the call was ended. I called back several times and got a message voicemail is full. Lennex Pietila,RN 3:20 I called Angelee again and was unable to leave a message- again heard voicemail is full. Per chart review in MAU visit yesterday reported going to [facility]. Will have registrar attempt to reach patient to confirm if she is going to [facility] or reschedule if desired. Rox Mcgriff,RN 3:26 I called Mykeisha again and again heard message voicemail is full. Efton Thomley,RN

## 2019-08-15 NOTE — Telephone Encounter (Signed)
Attempted to contact patient to clarify if she is getting ob care at [facility]. No answer and unable to leave a voicemail because her mail box is full.

## 2019-08-16 NOTE — Progress Notes (Signed)
Patient was assessed and managed by nursing staff during this encounter. I have reviewed the chart and agree with the documentation and plan. I have also made any necessary editorial changes.  Thressa Sheller DNP, CNM  08/16/19  8:20 AM

## 2019-08-24 DIAGNOSIS — Z419 Encounter for procedure for purposes other than remedying health state, unspecified: Secondary | ICD-10-CM | POA: Diagnosis not present

## 2019-08-29 ENCOUNTER — Encounter: Payer: Medicaid Other | Admitting: Advanced Practice Midwife

## 2019-08-31 DIAGNOSIS — Z01419 Encounter for gynecological examination (general) (routine) without abnormal findings: Secondary | ICD-10-CM | POA: Diagnosis not present

## 2019-09-05 ENCOUNTER — Inpatient Hospital Stay (HOSPITAL_BASED_OUTPATIENT_CLINIC_OR_DEPARTMENT_OTHER): Payer: Medicaid Other

## 2019-09-05 ENCOUNTER — Encounter (HOSPITAL_COMMUNITY): Payer: Self-pay | Admitting: Obstetrics and Gynecology

## 2019-09-05 ENCOUNTER — Inpatient Hospital Stay (HOSPITAL_COMMUNITY)
Admission: AD | Admit: 2019-09-05 | Discharge: 2019-09-06 | Disposition: A | Discharge status: Home / Self Care | Payer: Medicaid Other | Attending: Obstetrics and Gynecology | Admitting: Obstetrics and Gynecology

## 2019-09-05 DIAGNOSIS — R109 Unspecified abdominal pain: Secondary | ICD-10-CM | POA: Insufficient documentation

## 2019-09-05 DIAGNOSIS — O4692 Antepartum hemorrhage, unspecified, second trimester: Secondary | ICD-10-CM | POA: Diagnosis not present

## 2019-09-05 DIAGNOSIS — Z3A15 15 weeks gestation of pregnancy: Secondary | ICD-10-CM | POA: Insufficient documentation

## 2019-09-05 DIAGNOSIS — N930 Postcoital and contact bleeding: Secondary | ICD-10-CM | POA: Insufficient documentation

## 2019-09-05 DIAGNOSIS — O26852 Spotting complicating pregnancy, second trimester: Secondary | ICD-10-CM | POA: Insufficient documentation

## 2019-09-05 DIAGNOSIS — Z79899 Other long term (current) drug therapy: Secondary | ICD-10-CM | POA: Insufficient documentation

## 2019-09-05 DIAGNOSIS — O26892 Other specified pregnancy related conditions, second trimester: Secondary | ICD-10-CM | POA: Diagnosis not present

## 2019-09-05 DIAGNOSIS — N939 Abnormal uterine and vaginal bleeding, unspecified: Secondary | ICD-10-CM

## 2019-09-05 LAB — URINALYSIS, ROUTINE W REFLEX MICROSCOPIC
Bilirubin Urine: NEGATIVE
Glucose, UA: NEGATIVE mg/dL
Hgb urine dipstick: NEGATIVE
Ketones, ur: NEGATIVE mg/dL
Leukocytes,Ua: NEGATIVE
Nitrite: NEGATIVE
Protein, ur: NEGATIVE mg/dL
Specific Gravity, Urine: 1.018 (ref 1.005–1.030)
pH: 7 (ref 5.0–8.0)

## 2019-09-05 LAB — WET PREP, GENITAL
Clue Cells Wet Prep HPF POC: NONE SEEN
Sperm: NONE SEEN
Trich, Wet Prep: NONE SEEN
Yeast Wet Prep HPF POC: NONE SEEN
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
## 2019-09-05 NOTE — MAU Note (Signed)
t stated she went to the [facility] tonight and wiped and swa some blood. Reports some cramping but nothing out of the ordinary.

## 2019-09-06 ENCOUNTER — Other Ambulatory Visit: Payer: Self-pay

## 2019-09-06 DIAGNOSIS — Z3A15 15 weeks gestation of pregnancy: Secondary | ICD-10-CM | POA: Diagnosis not present

## 2019-09-06 DIAGNOSIS — O26892 Other specified pregnancy related conditions, second trimester: Secondary | ICD-10-CM | POA: Diagnosis not present

## 2019-09-06 DIAGNOSIS — N930 Postcoital and contact bleeding: Secondary | ICD-10-CM

## 2019-09-06 NOTE — Discharge Instructions (Signed)
Activity Restriction During Pregnancy for next 7 days Your health care provider may recommend specific activity restrictions during pregnancy for a variety of reasons. Activity restriction may require that you limit activities that require great effort, such as exercise, lifting, or sex. The type of activity restriction will vary for each person, depending on your risk or the problems you are having. Activity restriction may be recommended for a period of time until your baby is delivered. Why are activity restrictions recommended? Activity restriction may be recommended if:  Your placenta is partially or completely covering the opening of your cervix (placenta previa).  There is bleeding between the wall of the uterus and the amniotic sac in the first trimester of pregnancy (subchorionic hemorrhage).  You went into labor too early (preterm labor).  You have a history of miscarriage.  You have a condition that causes high blood pressure during pregnancy (preeclampsia or eclampsia).  You are pregnant with more than one baby.  Your baby is not growing well. What are the risks? The risks depend on your specific restriction. Strict bed rest has the most physical and emotional risks and is no longer routinely recommended. Risks of strict bed rest include:  Loss of muscle conditioning from not moving.  Blood clots.  Social isolation.  Depression.  Loss of income. Talk with your health care team about activity restriction to decide if it is best for you and your baby. Even if you are having problems during your pregnancy, you may be able to continue with normal levels of activity with careful monitoring by your health care team. Follow these instructions at home: If needed, based on your overall health and the health of your baby, your health care provider will decide which type of activity restriction is right for you. Activity restrictions may include:  Not lifting anything heavier than  10 pounds (4.5 kg).  Avoiding activities that take a lot of physical effort.  No lifting or straining.  Resting in a sitting position or lying down for periods of time during the day. Pelvic rest may be recommended along with activity restrictions. If pelvic rest is recommended, then:  Do not have sex, an orgasm, or use sexual stimulation.  Do not use tampons. Do not douche. Do not put anything into your vagina.  Do not lift anything that is heavier than 10 lb (4.5 kg).  Avoid activities that require a lot of effort.  Avoid any activity in which your pelvic muscles could become strained, such as squatting. Questions to ask your health care provider  Why is my activity being limited?  How will activity restrictions affect my body?  Why is rest helpful for me and my baby?  What activities can I do?  When can I return to normal activities? When should I seek immediate medical care? Seek immediate medical care if you have:  Vaginal bleeding.  Vaginal discharge.  Cramping pain in your lower abdomen.  Regular contractions.  A low, dull backache. Summary  Your health care provider may recommend specific activity restrictions during pregnancy for a variety of reasons.  Activity restriction may require that you limit activities such as exercise, lifting, sex, or any other activity that requires great effort.  Discuss the risks and benefits of activity restriction with your health care team to decide if it is best for you and your baby.  Contact your health care provider right away if you think you are having contractions, or if you notice vaginal bleeding, discharge, or cramping.  This information is not intended to replace advice given to you by your health care provider. Make sure you discuss any questions you have with your health care provider. Document Revised: 11/02/2018 Document Reviewed: 06/01/2017 Elsevier Patient Education  2020 [employer].

## 2019-09-07 ENCOUNTER — Other Ambulatory Visit: Payer: Self-pay

## 2019-09-13 ENCOUNTER — Ambulatory Visit: Payer: Self-pay | Admitting: Genetic Counselor

## 2019-09-13 ENCOUNTER — Ambulatory Visit: Payer: Medicaid Other | Attending: Obstetrics and Gynecology | Admitting: Genetic Counselor

## 2019-09-13 ENCOUNTER — Other Ambulatory Visit: Payer: Self-pay

## 2019-09-13 DIAGNOSIS — Z3A16 16 weeks gestation of pregnancy: Secondary | ICD-10-CM | POA: Diagnosis not present

## 2019-09-13 DIAGNOSIS — O285 Abnormal chromosomal and genetic finding on antenatal screening of mother: Secondary | ICD-10-CM | POA: Diagnosis not present

## 2019-09-13 DIAGNOSIS — Z315 Encounter for genetic counseling: Secondary | ICD-10-CM

## 2019-09-13 NOTE — Progress Notes (Signed)
09/13/2019  Anita Robinson February 23, 1996 MRN: [id number] DOV: 09/13/2019  Anita Robinson presented to the [facility] for Maternal Fetal Care for a genetics consultation regarding her abnormal noninvasive prenatal screening (NIPS) result.   Indication for genetic counseling - Atypical finding on sex chromosomes on NIPS  Prenatal history  Anita Robinson is a G37P1011, 24 y.o. female. Her current pregnancy has completed [redacted]w[redacted]d (Estimated Date of Delivery: 02/24/20). Anita Robinson has a 78 year old son from a prior relationship. She also had a miscarriage at 14 weeks' gestation with that same partner. The current pregnancy is the first for this couple.  Anita Robinson denied exposure to environmental toxins or chemical agents. She denied the use of alcohol, tobacco or street drugs. She reported taking prenatal vitamins. She denied significant viral illnesses, fevers, and bleeding during the course of her pregnancy. Her medical and surgical histories were noncontributory.  Family History  A three generation pedigree was drafted and reviewed. The family history is remarkable for the following:  - [name]'s partner. Candi Profit, was born with a "hole in his heart" that did not require surgery. The exact etiology of this congenital heart defect (CHD) is unknown. There are multifactorial causes for isolated CHDs, including environmental and genetic factors. As such, the recurrence risk for first degree relatives to also have a CHD is elevated over the general population incidence of 1/200 (0.5%). We discussed that without knowing the exact etiology, the risk for a child of an affected father to have a CHD is approximately 2-3%. A fetal echocardiogram to assess for CHDs in the current pregnancy may be recommended.  The remaining family histories were reviewed and found to be noncontributory for birth defects, intellectual disability, recurrent pregnancy loss, and known genetic conditions.    The patient's  ethnicity is [place]. The father of the pregnancy's ethnicity is [place]. Consanguinity was denied. Anita Robinson was uncertain whether she or her partner have any Ashkenazi Jewish ancestry. Pedigree will be scanned under Media.  Discussion  NIPS result:  Anita Robinson was referred for a genetic counseling consultation as she had [facility] noninvasive prenatal screening (NIPS) through [place] that demonstrated an atypical finding on the sex chromosomes of suspected fetal origin. These results also showed a less than 1 in 10,000 risk for trisomies 21, 18 and 13, a low risk for triploidy, and a 1 in9000 risk for 22q11.2 deletion syndrome.  We reviewed thatNIPSanalyzes cell-free fetal DNAfrom the placentafound in the maternal bloodstream duringpregnancy.NIPS is used to determine the risk for missing or extra fetal/placental chromosomal material for a specific subset of chromosomes.Based on this, we discussed that there are several possibilities that could warrantMs.Francis'sabnormal NIPS result. One possibility is the fetus having a genetic change involving the sex chromosomes. A second possibility is fetal mosaicism for a change involving the sex chromosomes. Mosaicism occurs when not every cell in the body is genetically identical; some cells in the body may have a genetic change involving the sex chromosomes, whereas others may have normal sex chromosomes. A third possibility is that of confined placental mosaicism, or a result representative of the placenta only rather than the fetus. Finally, it is possible that this is a false positive result.  We reviewed that even if the fetus were to have a sex chromosome abnormality, it would not be possible for [place] to predict if this would be a benign change or one that would have implications for health or development based on this NIPS result alone.We  also reviewed the limitations of NIPS, including the fact that it is not  diagnostic andthat itdoes not identify all genetic conditions. We discussed that without knowing the precisereason forthis abnormal NIPS result, we are limited in understanding if there may beany potential health or developmental effects for the fetus.  Testing options:  We reviewed available testing options to attempt to get more information to clarify this finding. Firstly, Anita Robinson was informed that diagnostic testing via amniocentesis would be the only way to determine if the fetus has a chromosomal abnormality involving the sex chromosomes prenatally. We discussed the technical aspects of the procedure and quoted up to a 1 in 500 (0.2%) risk for spontaneous pregnancy loss or other adverse pregnancy outcomes as a result of amniocentesis. Cultured cells from an amniocentesis sample allow for the visualization of a fetal karyotype, which can detect >99% of chromosomal aberrations. Chromosomal microarray can also be performed to identify smaller deletions or duplications offetalchromosomal material that fall below the resolution of karyotype. Anita Robinson was informed that if a sex chromosome abnormality were identified, we would then be able to research whether the finding is a benign change orone thatcould have implications for the fetus's postnatal health or development.   We reviewed that if Anita Robinson opted to undergo amniocentesis and results were to come back positive for a chromosomal change, it could impact pregnancy management in several different ways.We discussedthat some individualsmay choose toenda pregnancyor consider adoptionifa diagnosis of a chromosomal abnormality with developmental or health implications wereconfirmed in the fetus. Forindividualswho would not alter their pregnancy management regardless of testing outcomes, a prenataldiagnosiswouldallow for a greater understanding of the potential impacts on their child's health and development. Results may also  allow for delivery planning and prenatalconsults with specialists that would be involved in the infant's care,as well as offer time to planand prepare emotionally, physically, and financially. Finally, diagnostic testing can help to inform recurrence risks for future pregnancies.   Anita Robinson was also made aware that should amniocentesis not be desired, she can continue with standard ultrasounds to monitor for fetal anomalies. She was also informed that she may wait to pursue genetic testing postnatally if desired or clinically indicated. Anita Robinson opted not to pursue amniocentesis at this time. She understands that amniocentesis is available at any point after 16 weeks of pregnancy and that she may opt to undergo the procedure at a later date should she change her mind.  Carrier screening:  Finally, we reviewed that Anita Robinson had Horizon carrier screening performed through [employer], which was negative for 4 conditions. Thus, her risk to be a carrier for these 4 conditions (listed separately in the laboratory report) has been reduced but not eliminated. This puts her pregnancies at significantly decreased risk of being affected by one of these conditions.  Per ACOG recommendation, carrier screening for hemoglobinopathies was also discussed including information about the conditions, rationale for testing, autosomal recessive inheritance, and the option of prenatal diagnosis. I offered Anita Robinson additional carrier screening for hemoglobinopathies which she declined as she believes that she had screening for hemoglobinopathies in her previous pregnancy. Upon further review of her records, it was confirmed that Anita Robinson had a normal hemoglobin electrophoresis in 2015.   Plan:  Additional screening and diagnostic testing were declined today. Anita Robinson understands that screening tests, including ultrasound, cannot rule out all birth defects or genetic syndromes. The patient was advised of this  limitation and states she still does not want additional testing or screening  at this time.   I counseled Anita Robinson regarding the above risks and available options. The approximate face-to-face time with the genetic counselor was 30 minutes.  In summary:  Discussed NIPS result and options for follow-up testing  Atypical finding on the sex chromosomes of suspected placental/fetal origin  Declined amniocentesis  Discussed carrier screening results  Reduced risk to be carrier for the conditions on [employer] Horizon-4 panel  Reduced risk to be carrier for hemoglobinopathies based on hemoglobin electrophoresis from 2015  Reviewed family history concerns   Gershon Crane, MS, Aeronautical engineer

## 2019-09-24 DIAGNOSIS — Z419 Encounter for procedure for purposes other than remedying health state, unspecified: Secondary | ICD-10-CM | POA: Diagnosis not present

## 2019-10-05 DIAGNOSIS — Z363 Encounter for antenatal screening for malformations: Secondary | ICD-10-CM | POA: Diagnosis not present

## 2019-10-05 DIAGNOSIS — Z3A19 19 weeks gestation of pregnancy: Secondary | ICD-10-CM | POA: Diagnosis not present

## 2019-10-05 DIAGNOSIS — Z3492 Encounter for supervision of normal pregnancy, unspecified, second trimester: Secondary | ICD-10-CM | POA: Diagnosis not present

## 2019-10-25 DIAGNOSIS — Z419 Encounter for procedure for purposes other than remedying health state, unspecified: Secondary | ICD-10-CM | POA: Diagnosis not present

## 2019-10-31 ENCOUNTER — Other Ambulatory Visit: Payer: Medicaid Other

## 2019-10-31 ENCOUNTER — Other Ambulatory Visit: Payer: Self-pay

## 2019-10-31 DIAGNOSIS — Z20822 Contact with and (suspected) exposure to covid-19: Secondary | ICD-10-CM | POA: Diagnosis not present

## 2019-11-02 LAB — NOVEL CORONAVIRUS, NAA

## 2019-11-02 LAB — SARS-COV-2, NAA 2 DAY TAT

## 2019-11-24 DIAGNOSIS — Z419 Encounter for procedure for purposes other than remedying health state, unspecified: Secondary | ICD-10-CM | POA: Diagnosis not present

## 2019-12-03 ENCOUNTER — Inpatient Hospital Stay (HOSPITAL_COMMUNITY): Payer: Medicaid Other | Admitting: Anesthesiology

## 2019-12-03 ENCOUNTER — Inpatient Hospital Stay (HOSPITAL_BASED_OUTPATIENT_CLINIC_OR_DEPARTMENT_OTHER): Payer: Medicaid Other

## 2019-12-03 ENCOUNTER — Encounter (HOSPITAL_COMMUNITY): Payer: Self-pay | Admitting: Obstetrics and Gynecology

## 2019-12-03 ENCOUNTER — Encounter (HOSPITAL_COMMUNITY)
Admission: AD | Disposition: A | Discharge status: Home / Self Care | Payer: Self-pay | Source: Home / Self Care | Attending: Obstetrics and Gynecology

## 2019-12-03 ENCOUNTER — Other Ambulatory Visit: Payer: Self-pay

## 2019-12-03 ENCOUNTER — Inpatient Hospital Stay (HOSPITAL_COMMUNITY)
Admission: AD | Admit: 2019-12-03 | Discharge: 2019-12-05 | DRG: 786 | Disposition: A | Discharge status: Home / Self Care | Payer: Medicaid Other | Attending: Obstetrics and Gynecology | Admitting: Obstetrics and Gynecology

## 2019-12-03 DIAGNOSIS — O9902 Anemia complicating childbirth: Secondary | ICD-10-CM | POA: Diagnosis present

## 2019-12-03 DIAGNOSIS — Z20822 Contact with and (suspected) exposure to covid-19: Secondary | ICD-10-CM | POA: Diagnosis not present

## 2019-12-03 DIAGNOSIS — O99019 Anemia complicating pregnancy, unspecified trimester: Secondary | ICD-10-CM | POA: Diagnosis present

## 2019-12-03 DIAGNOSIS — O42913 Preterm premature rupture of membranes, unspecified as to length of time between rupture and onset of labor, third trimester: Secondary | ICD-10-CM | POA: Diagnosis not present

## 2019-12-03 DIAGNOSIS — Z3A28 28 weeks gestation of pregnancy: Secondary | ICD-10-CM

## 2019-12-03 DIAGNOSIS — O42113 Preterm premature rupture of membranes, onset of labor more than 24 hours following rupture, third trimester: Secondary | ICD-10-CM | POA: Diagnosis present

## 2019-12-03 DIAGNOSIS — O42919 Preterm premature rupture of membranes, unspecified as to length of time between rupture and onset of labor, unspecified trimester: Secondary | ICD-10-CM

## 2019-12-03 DIAGNOSIS — D62 Acute posthemorrhagic anemia: Secondary | ICD-10-CM | POA: Diagnosis not present

## 2019-12-03 DIAGNOSIS — O99213 Obesity complicating pregnancy, third trimester: Secondary | ICD-10-CM | POA: Diagnosis not present

## 2019-12-03 DIAGNOSIS — O2692 Pregnancy related conditions, unspecified, second trimester: Secondary | ICD-10-CM

## 2019-12-03 DIAGNOSIS — O351XX0 Maternal care for (suspected) chromosomal abnormality in fetus, not applicable or unspecified: Secondary | ICD-10-CM | POA: Diagnosis not present

## 2019-12-03 DIAGNOSIS — E669 Obesity, unspecified: Secondary | ICD-10-CM | POA: Diagnosis not present

## 2019-12-03 DIAGNOSIS — O9081 Anemia of the puerperium: Secondary | ICD-10-CM | POA: Diagnosis not present

## 2019-12-03 DIAGNOSIS — O4103X0 Oligohydramnios, third trimester, not applicable or unspecified: Secondary | ICD-10-CM

## 2019-12-03 DIAGNOSIS — O4590 Premature separation of placenta, unspecified, unspecified trimester: Secondary | ICD-10-CM | POA: Diagnosis not present

## 2019-12-03 DIAGNOSIS — O4593 Premature separation of placenta, unspecified, third trimester: Secondary | ICD-10-CM | POA: Diagnosis not present

## 2019-12-03 DIAGNOSIS — O4693 Antepartum hemorrhage, unspecified, third trimester: Secondary | ICD-10-CM | POA: Diagnosis not present

## 2019-12-03 DIAGNOSIS — Z98891 History of uterine scar from previous surgery: Secondary | ICD-10-CM

## 2019-12-03 DIAGNOSIS — N939 Abnormal uterine and vaginal bleeding, unspecified: Secondary | ICD-10-CM

## 2019-12-03 LAB — PROTIME-INR
INR: 1 (ref 0.8–1.2)
Prothrombin Time: 13.2 seconds (ref 11.4–15.2)

## 2019-12-03 LAB — CBC
HCT: 31.2 % — ABNORMAL LOW (ref 36.0–46.0)
HCT: 31.3 % — ABNORMAL LOW (ref 36.0–46.0)
HCT: 32.1 % — ABNORMAL LOW (ref 36.0–46.0)
Hemoglobin: 9.6 g/dL — ABNORMAL LOW (ref 12.0–15.0)
Hemoglobin: 9.5 g/dL — ABNORMAL LOW (ref 12.0–15.0)
Hemoglobin: 9.7 g/dL — ABNORMAL LOW (ref 12.0–15.0)
MCH: 24.3 pg — ABNORMAL LOW (ref 26.0–34.0)
MCH: 22.9 pg — ABNORMAL LOW (ref 26.0–34.0)
MCH: 24.2 pg — ABNORMAL LOW (ref 26.0–34.0)
MCHC: 30.8 g/dL (ref 30.0–36.0)
MCHC: 29.6 g/dL — ABNORMAL LOW (ref 30.0–36.0)
MCHC: 31 g/dL (ref 30.0–36.0)
MCV: 79 fL — ABNORMAL LOW (ref 80.0–100.0)
MCV: 77.5 fL — ABNORMAL LOW (ref 80.0–100.0)
MCV: 78.1 fL — ABNORMAL LOW (ref 80.0–100.0)
Platelets: 284 10*3/uL (ref 150–400)
Platelets: 253 10*3/uL (ref 150–400)
Platelets: 274 10*3/uL (ref 150–400)
RBC: 3.95 MIL/uL (ref 3.87–5.11)
RBC: 4.01 MIL/uL (ref 3.87–5.11)
RBC: 4.14 MIL/uL (ref 3.87–5.11)
RDW: 13.7 % (ref 11.5–15.5)
RDW: 13.5 % (ref 11.5–15.5)
RDW: 13.7 % (ref 11.5–15.5)
WBC: 8.5 10*3/uL (ref 4.0–10.5)
WBC: 10.2 10*3/uL (ref 4.0–10.5)
WBC: 9.2 10*3/uL (ref 4.0–10.5)
nRBC: 0 % (ref 0.0–0.2)
nRBC: 0 % (ref 0.0–0.2)
nRBC: 0 % (ref 0.0–0.2)

## 2019-12-03 LAB — URINALYSIS, ROUTINE W REFLEX MICROSCOPIC
Bacteria, UA: NONE SEEN
Bilirubin Urine: NEGATIVE
Glucose, UA: NEGATIVE mg/dL
Ketones, ur: NEGATIVE mg/dL
Leukocytes,Ua: NEGATIVE
Nitrite: NEGATIVE
Protein, ur: NEGATIVE mg/dL
Specific Gravity, Urine: 1.016 (ref 1.005–1.030)
pH: 6 (ref 5.0–8.0)

## 2019-12-03 LAB — FIBRINOGEN
Fibrinogen: 554 mg/dL — ABNORMAL HIGH (ref 210–475)
Fibrinogen: 580 mg/dL — ABNORMAL HIGH (ref 210–475)

## 2019-12-03 LAB — KLEIHAUER-BETKE STAIN
# Vials RhIg: 1
Fetal Cells %: 0 %
Quantitation Fetal Hemoglobin: 0 mL

## 2019-12-03 LAB — RESPIRATORY PANEL BY RT PCR (FLU A&B, COVID)
Influenza A by PCR: NEGATIVE
Influenza B by PCR: NEGATIVE
SARS Coronavirus 2 by RT PCR: NEGATIVE

## 2019-12-03 LAB — WET PREP, GENITAL
Clue Cells Wet Prep HPF POC: NONE SEEN
Sperm: NONE SEEN
Trich, Wet Prep: NONE SEEN
Yeast Wet Prep HPF POC: NONE SEEN

## 2019-12-03 LAB — TYPE AND SCREEN
ABO/RH(D): O POS
Antibody Screen: NEGATIVE

## 2019-12-03 LAB — POCT FERN TEST: POCT Fern Test: POSITIVE

## 2019-12-03 LAB — RPR: RPR Ser Ql: NONREACTIVE

## 2019-12-03 LAB — APTT: aPTT: 25 seconds (ref 24–36)

## 2019-12-03 SURGERY — Surgical Case
Anesthesia: General

## 2019-12-03 MED ORDER — PROPOFOL 10 MG/ML IV BOLUS
INTRAVENOUS | Status: DC | PRN
  Administered 2019-12-03: 150 mg via INTRAVENOUS

## 2019-12-03 MED ORDER — ACETAMINOPHEN 500 MG PO TABS
1000.0000 mg | ORAL_TABLET | Freq: Four times a day (QID) | ORAL | Status: AC
  Administered 2019-12-03 – 2019-12-04 (×4): 1000 mg via ORAL
  Filled 2019-12-03 (×4): qty 2

## 2019-12-03 MED ORDER — BETAMETHASONE SOD PHOS & ACET 6 (3-3) MG/ML IJ SUSP
12.0000 mg | Freq: Once | INTRAMUSCULAR | Status: AC
  Administered 2019-12-03: 12 mg via INTRAMUSCULAR
  Filled 2019-12-03: qty 5

## 2019-12-03 MED ORDER — SODIUM CHLORIDE 0.9% FLUSH: 3.0000 mL | INTRAVENOUS | Status: DC | PRN

## 2019-12-03 MED ORDER — FENTANYL CITRATE (PF) 250 MCG/5ML IJ SOLN
INTRAMUSCULAR | Status: AC
  Filled 2019-12-03: qty 5

## 2019-12-03 MED ORDER — OXYTOCIN-SODIUM CHLORIDE 30-0.9 UT/500ML-% IV SOLN
INTRAVENOUS | Status: DC | PRN
  Administered 2019-12-03: 30 [IU] via INTRAVENOUS

## 2019-12-03 MED ORDER — PRENATAL MULTIVITAMIN CH
1.0000 | ORAL_TABLET | Freq: Every day | ORAL | Status: DC
  Administered 2019-12-04 – 2019-12-05 (×2): 1 via ORAL
  Filled 2019-12-03 (×2): qty 1

## 2019-12-03 MED ORDER — MENTHOL 3 MG MT LOZG: 1.0000 | LOZENGE | OROMUCOSAL | Status: DC | PRN

## 2019-12-03 MED ORDER — DIPHENHYDRAMINE HCL 25 MG PO CAPS: 25.0000 mg | ORAL_CAPSULE | ORAL | Status: DC | PRN

## 2019-12-03 MED ORDER — NALBUPHINE HCL 10 MG/ML IJ SOLN: 5.0000 mg | INTRAMUSCULAR | Status: DC | PRN

## 2019-12-03 MED ORDER — DOCUSATE SODIUM 100 MG PO CAPS
100.0000 mg | ORAL_CAPSULE | Freq: Every day | ORAL | Status: DC
  Administered 2019-12-03: 100 mg via ORAL
  Filled 2019-12-03: qty 1

## 2019-12-03 MED ORDER — SCOPOLAMINE 1 MG/3DAYS TD PT72: 1.0000 | MEDICATED_PATCH | Freq: Once | TRANSDERMAL | Status: DC

## 2019-12-03 MED ORDER — COCONUT OIL OIL: 1.0000 "application " | TOPICAL_OIL | Status: DC | PRN

## 2019-12-03 MED ORDER — HYDROMORPHONE HCL 1 MG/ML IJ SOLN
INTRAMUSCULAR | Status: AC
  Filled 2019-12-03: qty 0.5

## 2019-12-03 MED ORDER — LIDOCAINE HCL (CARDIAC) PF 100 MG/5ML IV SOSY
PREFILLED_SYRINGE | INTRAVENOUS | Status: DC | PRN
  Administered 2019-12-03: 50 mg via INTRAVENOUS

## 2019-12-03 MED ORDER — SODIUM CHLORIDE 0.9 % IV SOLN
2.0000 g | Freq: Four times a day (QID) | INTRAVENOUS | Status: DC
  Administered 2019-12-03: 2 g via INTRAVENOUS
  Filled 2019-12-03: qty 2000

## 2019-12-03 MED ORDER — ONDANSETRON HCL 4 MG/2ML IJ SOLN: 4.0000 mg | Freq: Three times a day (TID) | INTRAMUSCULAR | Status: DC | PRN

## 2019-12-03 MED ORDER — AMOXICILLIN 500 MG PO CAPS: 500.0000 mg | ORAL_CAPSULE | Freq: Three times a day (TID) | ORAL | Status: DC

## 2019-12-03 MED ORDER — KETOROLAC TROMETHAMINE 30 MG/ML IJ SOLN
INTRAMUSCULAR | Status: AC
  Filled 2019-12-03: qty 1

## 2019-12-03 MED ORDER — CEFAZOLIN SODIUM-DEXTROSE 2-4 GM/100ML-% IV SOLN: 2.0000 g | INTRAVENOUS | Status: DC

## 2019-12-03 MED ORDER — NIFEDIPINE 10 MG PO CAPS
20.0000 mg | ORAL_CAPSULE | Freq: Once | ORAL | Status: AC
  Administered 2019-12-03: 20 mg via ORAL
  Filled 2019-12-03: qty 2

## 2019-12-03 MED ORDER — ACETAMINOPHEN 10 MG/ML IV SOLN
1000.0000 mg | Freq: Once | INTRAVENOUS | Status: DC | PRN
  Administered 2019-12-03: 1000 mg via INTRAVENOUS

## 2019-12-03 MED ORDER — KETOROLAC TROMETHAMINE 30 MG/ML IJ SOLN
30.0000 mg | Freq: Four times a day (QID) | INTRAMUSCULAR | Status: AC | PRN
  Administered 2019-12-03 – 2019-12-04 (×2): 30 mg via INTRAVENOUS
  Filled 2019-12-03: qty 1

## 2019-12-03 MED ORDER — SUCCINYLCHOLINE CHLORIDE 20 MG/ML IJ SOLN
INTRAMUSCULAR | Status: DC | PRN
  Administered 2019-12-03: 200 mg via INTRAVENOUS

## 2019-12-03 MED ORDER — NALOXONE HCL 4 MG/10ML IJ SOLN
1.0000 ug/kg/h | INTRAVENOUS | Status: DC | PRN
  Filled 2019-12-03: qty 5

## 2019-12-03 MED ORDER — NALBUPHINE HCL 10 MG/ML IJ SOLN: 5.0000 mg | Freq: Once | INTRAMUSCULAR | Status: DC | PRN

## 2019-12-03 MED ORDER — DIBUCAINE (PERIANAL) 1 % EX OINT: 1.0000 "application " | TOPICAL_OINTMENT | CUTANEOUS | Status: DC | PRN

## 2019-12-03 MED ORDER — AZITHROMYCIN 250 MG PO TABS
1000.0000 mg | ORAL_TABLET | Freq: Once | ORAL | Status: AC
  Administered 2019-12-03: 1000 mg via ORAL
  Filled 2019-12-03: qty 4

## 2019-12-03 MED ORDER — OXYTOCIN-SODIUM CHLORIDE 30-0.9 UT/500ML-% IV SOLN: 2.5000 [IU]/h | INTRAVENOUS | Status: AC

## 2019-12-03 MED ORDER — FENTANYL CITRATE (PF) 100 MCG/2ML IJ SOLN
INTRAMUSCULAR | Status: DC | PRN
Start: 2019-12-03 — End: 2019-12-03
  Administered 2019-12-03 (×4): 100 ug via INTRAVENOUS
  Administered 2019-12-03: 150 ug via INTRAVENOUS

## 2019-12-03 MED ORDER — ONDANSETRON HCL 4 MG/2ML IJ SOLN
INTRAMUSCULAR | Status: DC | PRN
  Administered 2019-12-03: 4 mg via INTRAVENOUS

## 2019-12-03 MED ORDER — SENNOSIDES-DOCUSATE SODIUM 8.6-50 MG PO TABS
2.0000 | ORAL_TABLET | ORAL | Status: DC
  Administered 2019-12-03 – 2019-12-05 (×2): 2 via ORAL
  Filled 2019-12-03 (×2): qty 2

## 2019-12-03 MED ORDER — FENTANYL CITRATE (PF) 100 MCG/2ML IJ SOLN
INTRAMUSCULAR | Status: AC
  Filled 2019-12-03: qty 2

## 2019-12-03 MED ORDER — SIMETHICONE 80 MG PO CHEW: 80.0000 mg | CHEWABLE_TABLET | ORAL | Status: DC | PRN

## 2019-12-03 MED ORDER — ZOLPIDEM TARTRATE 5 MG PO TABS: 5.0000 mg | ORAL_TABLET | Freq: Every evening | ORAL | Status: DC | PRN

## 2019-12-03 MED ORDER — ACETAMINOPHEN 10 MG/ML IV SOLN
INTRAVENOUS | Status: AC
  Filled 2019-12-03: qty 100

## 2019-12-03 MED ORDER — DIPHENHYDRAMINE HCL 25 MG PO CAPS: 25.0000 mg | ORAL_CAPSULE | Freq: Four times a day (QID) | ORAL | Status: DC | PRN

## 2019-12-03 MED ORDER — MAGNESIUM SULFATE BOLUS VIA INFUSION
4.0000 g | Freq: Once | INTRAVENOUS | Status: AC
  Administered 2019-12-03: 4 g via INTRAVENOUS
  Filled 2019-12-03: qty 1000

## 2019-12-03 MED ORDER — PRENATAL MULTIVITAMIN CH: 1.0000 | ORAL_TABLET | Freq: Every day | ORAL | Status: DC

## 2019-12-03 MED ORDER — CEFAZOLIN SODIUM-DEXTROSE 2-4 GM/100ML-% IV SOLN: 2.0000 g | Freq: Three times a day (TID) | INTRAVENOUS | Status: DC

## 2019-12-03 MED ORDER — DIPHENHYDRAMINE HCL 50 MG/ML IJ SOLN: 12.5000 mg | INTRAMUSCULAR | Status: DC | PRN

## 2019-12-03 MED ORDER — MAGNESIUM SULFATE 40 GM/1000ML IV SOLN
2.0000 g/h | INTRAVENOUS | Status: DC
  Filled 2019-12-03: qty 1000

## 2019-12-03 MED ORDER — HYDROMORPHONE HCL 1 MG/ML IJ SOLN
0.2500 mg | INTRAMUSCULAR | Status: DC | PRN
  Administered 2019-12-03 (×4): 0.5 mg via INTRAVENOUS

## 2019-12-03 MED ORDER — CALCIUM CARBONATE ANTACID 500 MG PO CHEW: 2.0000 | CHEWABLE_TABLET | ORAL | Status: DC | PRN

## 2019-12-03 MED ORDER — ONDANSETRON HCL 4 MG/2ML IJ SOLN
INTRAMUSCULAR | Status: AC
  Filled 2019-12-03: qty 2

## 2019-12-03 MED ORDER — OXYCODONE HCL 5 MG PO TABS
5.0000 mg | ORAL_TABLET | ORAL | Status: DC | PRN
  Administered 2019-12-03: 10 mg via ORAL
  Filled 2019-12-03: qty 2

## 2019-12-03 MED ORDER — SOD CITRATE-CITRIC ACID 500-334 MG/5ML PO SOLN
30.0000 mL | Freq: Once | ORAL | Status: AC
  Administered 2019-12-03: 30 mL via ORAL
  Filled 2019-12-03: qty 15

## 2019-12-03 MED ORDER — NALOXONE HCL 0.4 MG/ML IJ SOLN: 0.4000 mg | INTRAMUSCULAR | Status: DC | PRN

## 2019-12-03 MED ORDER — SODIUM CHLORIDE 0.9 % IV SOLN: INTRAVENOUS | Status: DC | PRN

## 2019-12-03 MED ORDER — LACTATED RINGERS IV SOLN: INTRAVENOUS | Status: DC

## 2019-12-03 MED ORDER — KETOROLAC TROMETHAMINE 30 MG/ML IJ SOLN
30.0000 mg | Freq: Four times a day (QID) | INTRAMUSCULAR | Status: AC | PRN
  Filled 2019-12-03: qty 1

## 2019-12-03 MED ORDER — SOD CITRATE-CITRIC ACID 500-334 MG/5ML PO SOLN
ORAL | Status: AC
  Filled 2019-12-03: qty 15

## 2019-12-03 MED ORDER — CEFAZOLIN SODIUM-DEXTROSE 2-3 GM-%(50ML) IV SOLR
INTRAVENOUS | Status: DC | PRN
  Administered 2019-12-03: 2 g via INTRAVENOUS

## 2019-12-03 MED ORDER — KETOROLAC TROMETHAMINE 30 MG/ML IJ SOLN
30.0000 mg | Freq: Once | INTRAMUSCULAR | Status: AC | PRN
  Administered 2019-12-03: 30 mg via INTRAVENOUS

## 2019-12-03 MED ORDER — LACTATED RINGERS IV SOLN: INTRAVENOUS | Status: DC | PRN

## 2019-12-03 MED ORDER — NIFEDIPINE 10 MG PO CAPS
10.0000 mg | ORAL_CAPSULE | Freq: Four times a day (QID) | ORAL | Status: DC | PRN
  Filled 2019-12-03: qty 1

## 2019-12-03 MED ORDER — OXYCODONE HCL 5 MG PO TABS
5.0000 mg | ORAL_TABLET | ORAL | Status: AC | PRN
  Administered 2019-12-04: 5 mg via ORAL
  Filled 2019-12-03: qty 1

## 2019-12-03 MED ORDER — DEXMEDETOMIDINE (PRECEDEX) IN NS 20 MCG/5ML (4 MCG/ML) IV SYRINGE
PREFILLED_SYRINGE | INTRAVENOUS | Status: DC | PRN
  Administered 2019-12-03 (×2): 4 ug via INTRAVENOUS

## 2019-12-03 MED ORDER — NIFEDIPINE 10 MG PO CAPS: 20.0000 mg | ORAL_CAPSULE | Freq: Once | ORAL | Status: DC

## 2019-12-03 MED ORDER — LACTATED RINGERS IV BOLUS
1000.0000 mL | Freq: Once | INTRAVENOUS | Status: AC
  Administered 2019-12-03: 1000 mL via INTRAVENOUS

## 2019-12-03 MED ORDER — WITCH HAZEL-GLYCERIN EX PADS: 1.0000 "application " | MEDICATED_PAD | CUTANEOUS | Status: DC | PRN

## 2019-12-03 MED ORDER — SIMETHICONE 80 MG PO CHEW
80.0000 mg | CHEWABLE_TABLET | Freq: Three times a day (TID) | ORAL | Status: DC
  Administered 2019-12-04 – 2019-12-05 (×3): 80 mg via ORAL
  Filled 2019-12-03 (×3): qty 1

## 2019-12-03 MED ORDER — MORPHINE SULFATE (PF) 2 MG/ML IV SOLN: 1.0000 mg | INTRAVENOUS | Status: DC | PRN

## 2019-12-03 MED ORDER — SIMETHICONE 80 MG PO CHEW
80.0000 mg | CHEWABLE_TABLET | ORAL | Status: DC
  Administered 2019-12-03 – 2019-12-05 (×2): 80 mg via ORAL
  Filled 2019-12-03 (×2): qty 1

## 2019-12-03 MED ORDER — ACETAMINOPHEN 325 MG PO TABS: 650.0000 mg | ORAL_TABLET | ORAL | Status: DC | PRN

## 2019-12-03 MED ORDER — DEXAMETHASONE SODIUM PHOSPHATE 10 MG/ML IJ SOLN
INTRAMUSCULAR | Status: DC | PRN
  Administered 2019-12-03: 10 mg via INTRAVENOUS

## 2019-12-03 MED ORDER — DEXAMETHASONE SODIUM PHOSPHATE 10 MG/ML IJ SOLN
INTRAMUSCULAR | Status: AC
  Filled 2019-12-03: qty 1

## 2019-12-03 SURGICAL SUPPLY — 32 items
BENZOIN TINCTURE PRP APPL 2/3 (GAUZE/BANDAGES/DRESSINGS) ×3 IMPLANT
CHLORAPREP W/TINT 26ML (MISCELLANEOUS) ×3 IMPLANT
CLAMP CORD UMBIL (MISCELLANEOUS) IMPLANT
CLOSURE WOUND 1/2 X4 (GAUZE/BANDAGES/DRESSINGS) ×1
CLOTH BEACON ORANGE TIMEOUT ST (SAFETY) ×3 IMPLANT
DRAPE C SECTION CLR SCREEN (DRAPES) ×3 IMPLANT
DRSG OPSITE POSTOP 4X10 (GAUZE/BANDAGES/DRESSINGS) ×3 IMPLANT
ELECT REM PT RETURN 9FT ADLT (ELECTROSURGICAL) ×3
ELECTRODE REM PT RTRN 9FT ADLT (ELECTROSURGICAL) ×1 IMPLANT
EXTRACTOR VACUUM KIWI (MISCELLANEOUS) IMPLANT
GLOVE BIOGEL M 7.0 STRL (GLOVE) ×3 IMPLANT
GLOVE BIOGEL PI IND STRL 7.0 (GLOVE) ×3 IMPLANT
GLOVE BIOGEL PI INDICATOR 7.0 (GLOVE) ×6
GOWN STRL REUS W/TWL LRG LVL3 (GOWN DISPOSABLE) ×9 IMPLANT
KIT ABG SYR 3ML LUER SLIP (SYRINGE) IMPLANT
NEEDLE HYPO 25X5/8 SAFETYGLIDE (NEEDLE) IMPLANT
NS IRRIG 1000ML POUR BTL (IV SOLUTION) ×3 IMPLANT
PACK C SECTION WH (CUSTOM PROCEDURE TRAY) ×3 IMPLANT
PAD OB MATERNITY 4.3X12.25 (PERSONAL CARE ITEMS) ×3 IMPLANT
PENCIL SMOKE EVAC W/HOLSTER (ELECTROSURGICAL) ×3 IMPLANT
RTRCTR C-SECT PINK 25CM LRG (MISCELLANEOUS) IMPLANT
STRIP CLOSURE SKIN 1/2X4 (GAUZE/BANDAGES/DRESSINGS) ×2 IMPLANT
SUT MNCRL 0 VIOLET CTX 36 (SUTURE) ×2 IMPLANT
SUT MONOCRYL 0 CTX 36 (SUTURE) ×4
SUT PDS AB 0 CTX 60 (SUTURE) ×3 IMPLANT
SUT PLAIN 0 NONE (SUTURE) IMPLANT
SUT VIC AB 2-0 CT1 27 (SUTURE)
SUT VIC AB 2-0 CT1 TAPERPNT 27 (SUTURE) IMPLANT
SUT VIC AB 4-0 KS 27 (SUTURE) ×3 IMPLANT
TOWEL OR 17X24 6PK STRL BLUE (TOWEL DISPOSABLE) ×3 IMPLANT
TRAY FOLEY W/BAG SLVR 14FR LF (SET/KITS/TRAYS/PACK) ×3 IMPLANT
WATER STERILE IRR 1000ML POUR (IV SOLUTION) ×3 IMPLANT

## 2019-12-03 NOTE — H&P (Signed)
History of Present Illness: Anita Robinson is a 24 y.o. G3P1011 at [redacted]w[redacted]d admitted for PPROM.  Patient states she felt a "pop" and started leaking fluid around 0330.  She said that her clothes were saturated and that she "keep pushing and water kept coming out."  Patient endorses fetal movement and denies cramping or contractions.  However, she does report some tenderness below her umbilicus that started after the leaking.  Patient endorses sexual activity today.  She denies vaginal bleeding or discharge prior to the leaking.  Patient reports the fetal movement as active. Patient reports uterine contraction  activity as none perceived. Patient reports  vaginal bleeding as none. Patient describes fluid per vagina as Clear.   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 Preterm premature rupture of membranes (PPROM) antepartum 12/03/2019  . Sepsis secondary to UTI (HCC) 02/17/2018  . TOA (tubo-ovarian abscess) 11/21/2014  . Trichomoniasis 08/08/2013    Past Medical History:  Diagnosis Date  . Anemia    on Iron  . Complication of anesthesia    has never had any type of anesthesia.  . Low iron     Past Surgical History:  Procedure Laterality Date  . NO PAST SURGERIES      OB History  Gravida Para Term Preterm AB Living  3 1 1  0 1 1  SAB TAB Ectopic Multiple Live Births  1 0 0 0 1    # Outcome Date GA Lbr Len/2nd Weight Sex Delivery Anes PTL Lv  3 Current           2 Term 12/23/13 [redacted]w[redacted]d -16:59 / 01:15 3189 g M Vag-Spont EPI  LIV  1 SAB  [redacted]w[redacted]d            Social History   Socioeconomic History  . Marital status: Single    Spouse name: Not on file  . Number of children: Not on file  . Years of education: Not on file  . Highest education level: Not on file  Occupational History  . Not on file  Tobacco Use  . Smoking status: Never Smoker  . Smokeless tobacco: Never Used  Vaping Use  . Vaping Use: Never used  Substance and Sexual Activity  . Alcohol use: Not Currently    Comment: social drinker  . Drug use: No  . Sexual activity: Yes    Birth control/protection: None  Other Topics Concern  . Not on file  Social History Narrative  . Not on file   Social Determinants of Health   Financial Resource Strain:   . Difficulty of Paying Living Expenses: Not on file  Food Insecurity:   . Worried About [redacted]w[redacted]d in the Last Year: Not on file  . Ran Out of Food in the Last Year: Not on file  Transportation Needs:   . Lack of Transportation (Medical): Not on file  . Lack of Transportation (Non-Medical): Not on file  Physical Activity:   . Days of Exercise per Week: Not on file  . Minutes of Exercise per  Session: Not on file  Stress:   . Feeling of Stress : Not on file  Social Connections:   . Frequency of Communication with Friends and Family: Not on file  . Frequency of Social Gatherings with Friends and Family: Not on file  . Attends Religious Services: Not on file  . Active Member of Clubs or Organizations: Not on file  . Attends Banker Meetings: Not on file  . Marital Status: Not on file    Family History  Problem Relation Age of Onset  . Asthma Mother   . Asthma Sister   . Asthma Brother   . Alcohol abuse Neg Hx   . Arthritis Neg Hx   . Birth defects Neg Hx   . Cancer Neg Hx   . COPD Neg Hx   . Depression Neg Hx   . Diabetes Neg Hx   . Drug abuse Neg Hx   . Early death Neg Hx   . Hearing loss Neg Hx   . Heart disease Neg Hx   . Hyperlipidemia Neg Hx   . Hypertension Neg Hx   .  Kidney disease Neg Hx   . Learning disabilities Neg Hx   . Mental illness Neg Hx   . Mental retardation Neg Hx   . Miscarriages / Stillbirths Neg Hx   . Stroke Neg Hx   . Vision loss Neg Hx   . Varicose Veins Neg Hx     Allergies  Allergen Reactions  . Eggs Or Egg-Derived Products     Pt says she get bumps     Medications Prior to Admission  Medication Sig Dispense Refill Last Dose  . Prenatal Vit-Fe Fumarate-FA (PRENATAL MULTIVITAMIN) TABS tablet Take 1 tablet by mouth daily at 12 noon.   12/03/2019 at Unknown time  . acetaminophen (TYLENOL) 325 MG tablet Take 2 tablets (650 mg total) by mouth every 4 (four) hours as needed. 100 tablet 2   . Doxylamine-Pyridoxine 10-10 MG TBEC Start with 2 tablets every evening. If symptoms persist, add 1 tablet every morning. If symptoms persist, add 1 tablet at mid-day. 120 tablet 0     Review of Systems - Negative except pelvic pressure and LOF.  Vitals:  BP 114/68   Pulse (!) 122   Temp 98.3 F (36.8 C)   Resp 16   Ht 5\' 4"  (1.626 m)   Wt 81.6 kg   LMP 05/02/2019   SpO2 100%   BMI 30.90 kg/m    Physical Examination: CONSTITUTIONAL: Well-developed, well-nourished female in no acute distress.  HENT:  Normocephalic, atraumatic, External right and left ear normal. Oropharynx is clear and moist EYES: Conjunctivae and EOM are normal. Pupils are equal, round, and reactive to light. No scleral icterus.  NECK: Normal range of motion, supple, no masses SKIN: Skin is warm and dry. No rash noted. Not diaphoretic. No erythema. No pallor. NEUROLGIC: Alert and oriented to person, place, and time. Normal reflexes, muscle tone coordination. No cranial nerve deficit noted. PSYCHIATRIC: Normal mood and affect. Normal behavior. Normal judgment and thought content. CARDIOVASCULAR: Normal heart rate noted, regular rhythm RESPIRATORY: Effort and breath sounds normal, no problems with respiration noted ABDOMEN: Soft, nontender, nondistended,  gravid. MUSCULOSKELETAL: Normal range of motion. No edema and no tenderness. 2+ distal pulses.  Cervix: per [facility] provider exam and found to be 1-2 cm/ 50%/Floating and fetal presentation is unsure. Membranes:intact, ruptured, clear fluid, sterile speculum exam per FP provider Fetal Monitoring:Baseline: 150 bpm, Variability: Good {> 6 bpm),  Accelerations: Non-reactive but appropriate for gestational age and Decelerations: Absent Tocometer: q 2-4 min, mild to palp.  Labs:  Results for orders placed or performed during the hospital encounter of 12/03/19 (from the past 24 hour(s))  Urinalysis, Routine w reflex microscopic Urine, Clean Catch   Collection Time: 12/03/19  3:33 AM  Result Value Ref Range   Color, Urine YELLOW YELLOW   APPearance CLEAR CLEAR   Specific Gravity, Urine 1.016 1.005 - 1.030   pH 6.0 5.0 - 8.0   Glucose, UA NEGATIVE NEGATIVE mg/dL   Hgb urine dipstick SMALL (A) NEGATIVE   Bilirubin Urine NEGATIVE NEGATIVE   Ketones, ur NEGATIVE NEGATIVE mg/dL   Protein, ur NEGATIVE NEGATIVE mg/dL   Nitrite NEGATIVE NEGATIVE   Leukocytes,Ua NEGATIVE NEGATIVE   RBC / HPF 11-20 0 - 5 RBC/hpf   WBC, UA 0-5 0 - 5 WBC/hpf   Bacteria, UA NONE SEEN NONE SEEN   Squamous Epithelial / LPF 0-5 0 - 5   Mucus PRESENT   Fern Test   Collection Time: 12/03/19  4:00 AM  Result Value Ref Range   POCT Fern Test Positive = ruptured amniotic membanes   Wet prep, genital   Collection Time: 12/03/19  4:11 AM  Result Value Ref Range   Yeast Wet Prep HPF POC NONE SEEN NONE SEEN   Trich, Wet Prep NONE SEEN NONE SEEN   Clue Cells Wet Prep HPF POC NONE SEEN NONE SEEN   WBC, Wet Prep HPF POC FEW (A) NONE SEEN   Sperm NONE SEEN      Assessment and Plan: G3P1011 at [redacted]w[redacted]d  PPROM Preterm contractions FHT category 1 Monosomy X  Admit to Antenatal Unit Betamethasone x 2 doses ABX triple coverage per protocol pending cultures Nifedipine protocol tocolysis LR 1 L bolus now MFM and NICU consults  pending Magnesium sulfate for CP prophylaxis if active labor ensues Will check presentation with growth sono today and repeat if she does progress in preterm labor to determine route of delivery Routine antenatal care  POC in consult w/ Dr. Maureen Chatters, MSN, CNM 12/03/2019, 6:16 AM

## 2019-12-03 NOTE — Progress Notes (Signed)
ANTEPARTUM - Hospital Day 0  CNM was called to room for bright red vaginal bleeding. Upon entering the room, patient was in the bed in no apparent distress with FOB at the bedside. Underpad and pad showed moderate amount of bright red bleeding.   Subjective: Reports feeling mild cramping and continued leaking of fluid Report bright red vaginal bleeding Fetal activity perceived as normal    Objective: Vital signs: VS: Blood pressure 133/82, pulse (!) 116, temperature 98.2 F (36.8 C), temperature source Oral, resp. rate 18, height 5\' 4"  (1.626 m), weight 81.6 kg, last menstrual period 05/02/2019, SpO2 100 %, unknown if currently breastfeeding.  Labs: Results for orders placed or performed during the hospital encounter of 12/03/19 (from the past 24 hour(s))  Urinalysis, Routine w reflex microscopic Urine, Clean Catch     Status: Abnormal   Collection Time: 12/03/19  3:33 AM  Result Value Ref Range   Color, Urine YELLOW YELLOW   APPearance CLEAR CLEAR   Specific Gravity, Urine 1.016 1.005 - 1.030   pH 6.0 5.0 - 8.0   Glucose, UA NEGATIVE NEGATIVE mg/dL   Hgb urine dipstick SMALL (A) NEGATIVE   Bilirubin Urine NEGATIVE NEGATIVE   Ketones, ur NEGATIVE NEGATIVE mg/dL   Protein, ur NEGATIVE NEGATIVE mg/dL   Nitrite NEGATIVE NEGATIVE   Leukocytes,Ua NEGATIVE NEGATIVE   RBC / HPF 11-20 0 - 5 RBC/hpf   WBC, UA 0-5 0 - 5 WBC/hpf   Bacteria, UA NONE SEEN NONE SEEN   Squamous Epithelial / LPF 0-5 0 - 5   Mucus PRESENT   Fern Test     Status: Normal   Collection Time: 12/03/19  4:00 AM  Result Value Ref Range   POCT Fern Test Positive = ruptured amniotic membanes   Wet prep, genital     Status: Abnormal   Collection Time: 12/03/19  4:11 AM  Result Value Ref Range   Yeast Wet Prep HPF POC NONE SEEN NONE SEEN   Trich, Wet Prep NONE SEEN NONE SEEN   Clue Cells Wet Prep HPF POC NONE SEEN NONE SEEN   WBC, Wet Prep HPF POC FEW (A) NONE SEEN   Sperm NONE SEEN   Respiratory Panel by RT PCR  (Flu A&B, Covid) - Nasopharyngeal Swab     Status: None   Collection Time: 12/03/19  4:35 AM   Specimen: Nasopharyngeal Swab  Result Value Ref Range   SARS Coronavirus 2 by RT PCR NEGATIVE NEGATIVE   Influenza A by PCR NEGATIVE NEGATIVE   Influenza B by PCR NEGATIVE NEGATIVE  Type and screen [facility]     Status: None   Collection Time: 12/03/19  4:40 AM  Result Value Ref Range   ABO/RH(D) O POS    Antibody Screen NEG    Sample Expiration      12/06/2019,2359 Performed at [facility], [address] [place], [place]   RPR     Status: None   Collection Time: 12/03/19  5:37 AM  Result Value Ref Range   RPR Ser Ql NON REACTIVE NON REACTIVE  CBC on admission     Status: Abnormal   Collection Time: 12/03/19  5:37 AM  Result Value Ref Range   WBC 8.5 4.0 - 10.5 K/uL   RBC 3.95 3.87 - 5.11 MIL/uL   Hemoglobin 9.6 (L) 12.0 - 15.0 g/dL   HCT 02/02/20 (L) 36 - 46 %   MCV 79.0 (L) 80.0 - 100.0 fL   MCH  24.3 (L) 26.0 - 34.0 pg   MCHC 30.8 30.0 - 36.0 g/dL   RDW 00.7 62.2 - 63.3 %   Platelets 284 150 - 400 K/uL   nRBC 0.0 0.0 - 0.2 %  CBC     Status: Abnormal   Collection Time: 12/03/19 11:49 AM  Result Value Ref Range   WBC 9.2 4.0 - 10.5 K/uL   RBC 4.01 3.87 - 5.11 MIL/uL   Hemoglobin 9.7 (L) 12.0 - 15.0 g/dL   HCT 35.4 (L) 36 - 46 %   MCV 78.1 (L) 80.0 - 100.0 fL   MCH 24.2 (L) 26.0 - 34.0 pg   MCHC 31.0 30.0 - 36.0 g/dL   RDW 56.2 56.3 - 89.3 %   Platelets 274 150 - 400 K/uL   nRBC 0.0 0.0 - 0.2 %  Fibrinogen     Status: Abnormal   Collection Time: 12/03/19 11:49 AM  Result Value Ref Range   Fibrinogen 554 (H) 210 - 475 mg/dL  Fibrinogen (coagulopathy lab panel)     Status: Abnormal   Collection Time: 12/03/19 12:18 PM  Result Value Ref Range   Fibrinogen 580 (H) 210 - 475 mg/dL  Protime-INR (coagulopathy lab panel)     Status: None   Collection Time: 12/03/19 12:18 PM  Result Value Ref Range   Prothrombin Time 13.2 11.4 - 15.2 seconds    INR 1.0 0.8 - 1.2  APTT (coagulopathy lab panel)     Status: None   Collection Time: 12/03/19 12:18 PM  Result Value Ref Range   aPTT 25 24 - 36 seconds  CBC     Status: Abnormal   Collection Time: 12/03/19 12:18 PM  Result Value Ref Range   WBC 10.2 4.0 - 10.5 K/uL   RBC 4.14 3.87 - 5.11 MIL/uL   Hemoglobin 9.5 (L) 12.0 - 15.0 g/dL   HCT 73.4 (L) 36 - 46 %   MCV 77.5 (L) 80.0 - 100.0 fL   MCH 22.9 (L) 26.0 - 34.0 pg   MCHC 29.6 (L) 30.0 - 36.0 g/dL   RDW 28.7 68.1 - 15.7 %   Platelets 253 150 - 400 K/uL   nRBC 0.0 0.0 - 0.2 %   SVE: 1/50/-2       Fetal Assessment: FHR: Baseline 135bpm, moderate variability TOCO 1.5-4 minutes  Assessment: 28.[redacted] weeks gestation FHR category 1 New onset bright red vaginal bleeding Contractions every 1.5-4 minutes  Plan:  Dr. Connye Burkitt updated on bright red bleeding and new onset contractions. En route to hospital STAT complete OB ultrasound Start magnesium sulfate for neuro prophylaxis CBC, KB stain, and fibrinogen ordered  June Leap 12/03/2019, 1:04 PM

## 2019-12-03 NOTE — Progress Notes (Signed)
OB Progress Note  In with Dorisann Frames, CNM to evaluate patient for bleeding.  Concern for placental abruption.  FHT reviewed and variables.  Contractions every 1-4 minutes.  Patient visibly uncomfortable.   I have explained to the patient that this surgery is performed to deliver their baby or babies through an incision in the abdomen and incision in the uterus.  Prior to surgery, the risks and benefits of the surgery, as well as alternative treatments were discussed.  The risks include, but are not limited to, possible need for cesarean delivery for all future pregnancies, bleeding at the time of surgery that could necessitate a blood transfusion and/or hysterectomy, rupture of the uterus during a future pregnancy that could cause a preterm delivery and/or requiring hysterectomy, infection, damage to surrounding organs and tissues, damage to bladder, damage to ureters, causing kidney damage, and requiring additional procedures, damage to bowels, resulting in further surgery, postoperative pain, short-term and long-term, scarring on the abdominal wall and intra-abdominally, need for further surgery, development of an incisional hernia, deep vein thrombosis and/or pulmonary embolism, wound infection and/or separation, painful intercourse, urinary leakage, impact on future pregnancies including but not limited to, abnormal location or attachment of the placenta to the uterus, such as placenta previa or accreta, that may necessitate a blood transfusion and/or hysterectomy, impact on total family size, complications the course of which cannot be predicted or prevented, and death. Patient was consented for blood products.  The patient is aware that bleeding may result in the need for a blood transfusion which includes risk of transmission of HIV (1:2 million), Hepatitis C (1:2 million), and Hepatitis B (1:200 thousand) and transfusion reaction.  Patient voiced understanding of the above risks as well as  understanding of indications for blood transfusion.  Steva Ready, DO

## 2019-12-03 NOTE — Anesthesia Preprocedure Evaluation (Signed)
Anesthesia Evaluation  Patient identified by MRN, date of birth, ID band Patient awake    Reviewed: Allergy & Precautions, NPO status , Patient's Chart, lab work & pertinent test results  Airway Mallampati: II  TM Distance: >3 FB Neck ROM: Full    Dental no notable dental hx. (+) Teeth Intact, Dental Advisory Given   Pulmonary neg pulmonary ROS,    Pulmonary exam normal breath sounds clear to auscultation       Cardiovascular negative cardio ROS Normal cardiovascular exam Rhythm:Regular Rate:Normal     Neuro/Psych negative neurological ROS  negative psych ROS   GI/Hepatic negative GI ROS, Neg liver ROS,   Endo/Other  negative endocrine ROS  Renal/GU negative Renal ROS  negative genitourinary   Musculoskeletal negative musculoskeletal ROS (+)   Abdominal   Peds  Hematology  (+) Blood dyscrasia (Hgb 9.5), anemia ,   Anesthesia Other Findings Presented with PPROM at [redacted]w[redacted]d gestation now with vaginal bleeding c/f abruption  Reproductive/Obstetrics (+) Pregnancy                             Anesthesia Physical Anesthesia Plan  ASA: III and emergent  Anesthesia Plan: General   Post-op Pain Management:    Induction: Intravenous  PONV Risk Score and Plan: 3 and Midazolam, Dexamethasone and Ondansetron  Airway Management Planned: Oral ETT  Additional Equipment:   Intra-op Plan:   Post-operative Plan: Extubation in OR  Informed Consent: I have reviewed the patients History and Physical, chart, labs and discussed the procedure including the risks, benefits and alternatives for the proposed anesthesia with the patient or authorized representative who has indicated his/her understanding and acceptance.     Dental advisory given  Plan Discussed with: CRNA  Anesthesia Plan Comments:         Anesthesia Quick Evaluation

## 2019-12-03 NOTE — MAU Note (Signed)
.   Anita Robinson is a 24 y.o. at [redacted]w[redacted]d here in MAU reporting: she starting having clear leakage of fluid 30 mins ago. Denies any VB. +FM  Onset of complaint: ago Pain score: 2 Vitals:   12/03/19 0340  BP: 114/68  Pulse: (!) 122  Resp: 16  Temp: 98.3 F (36.8 C)  SpO2: 100%     FHT:150 Lab orders placed from triage: UA

## 2019-12-03 NOTE — Transfer of Care (Signed)
Immediate Anesthesia Transfer of Care Note  Patient: Anita Robinson  Procedure(s) Performed: CESAREAN SECTION (N/A )  Patient Location: PACU  Anesthesia Type:General  Level of Consciousness: awake, alert  and oriented  Airway & Oxygen Therapy: Patient Spontanous Breathing  Post-op Assessment: Report given to RN and Post -op Vital signs reviewed and stable  Post vital signs: Reviewed and stable  Last Vitals:  Vitals Value Taken Time  BP 126/70 12/03/19 1301  Temp 36.7 C 12/03/19 1301  Pulse 114 12/03/19 1308  Resp 20 12/03/19 1308  SpO2 98 % 12/03/19 1308  Vitals shown include unvalidated device data.  Last Pain:  Vitals:   12/03/19 1301  TempSrc: Oral  PainSc:          Complications: No complications documented.

## 2019-12-03 NOTE — Lactation Note (Signed)
This note was copied from a baby's chart. Lactation Consultation Note  Patient Name: Anita Robinson IOEVO'J Date: 12/03/2019 Reason for consult: Initial assessment;NICU baby;Preterm <34wks;Infant < 6lbs LC to room for initial consult with pt who delivered by c/s at 28 weeks. This patient is p2 and bf her last baby for about 2 mo. She has normal breast development and denies hx of breast surgery. Baby is currently in NICU. With patient's consent, LC set up pump and assisted with initial pumping. Taught HE and encouraged her to use pump and/or HE q 2-3 hours. Brief teaching r/t pt was very sleepy. She will benefit from reinforcement of ed at next visit. Referral to [place]WIC was completed.    Interventions Interventions: Breast feeding basics reviewed;Support pillows;Skin to skin;Breast massage;Hand express;DEBP  Lactation Tools Discussed/Used Tools: Pump;Flanges Flange Size: 24 Breast pump type: Double-Electric Breast Pump WIC Program: Yes Pump Review: Setup, frequency, and cleaning;Milk Storage Initiated by:: LMiller Date initiated:: 12/03/19   Consult Status Consult Status: Follow-up Date: 12/04/19 Follow-up type: In-patient    Elder Negus 12/03/2019, 4:59 PM

## 2019-12-03 NOTE — Anesthesia Postprocedure Evaluation (Signed)
Anesthesia Post Note  Patient: Anita Robinson  Procedure(s) Performed: CESAREAN SECTION (N/A )     Patient location during evaluation: PACU Anesthesia Type: General Level of consciousness: awake and alert Pain management: pain level controlled Vital Signs Assessment: post-procedure vital signs reviewed and stable Respiratory status: spontaneous breathing, nonlabored ventilation, respiratory function stable and patient connected to nasal cannula oxygen Cardiovascular status: blood pressure returned to baseline and stable Postop Assessment: no apparent nausea or vomiting Anesthetic complications: no   No complications documented.  Last Vitals:  Vitals:   12/03/19 1445 12/03/19 1530  BP: 94/82 116/68  Pulse: 97 76  Resp: 20 16  Temp:  37.3 C  SpO2: 96% 98%    Last Pain:  Vitals:   12/03/19 1530  TempSrc: Oral  PainSc: 5    Pain Goal:                Epidural/Spinal Function Cutaneous sensation: Normal sensation (12/03/19 1530), Patient able to flex knees: Yes (12/03/19 1530), Patient able to lift hips off bed: Yes (12/03/19 1530), Back pain beyond tenderness at insertion site: No (12/03/19 1530), Progressively worsening motor and/or sensory loss: No (12/03/19 1530), Bowel and/or bladder incontinence post epidural: No (12/03/19 1530)  [name]

## 2019-12-03 NOTE — Op Note (Signed)
Cesarean Section Procedure Note  Indications: abruptio placenta and non-reassuring fetal status  Pre-operative Diagnosis: 28 week 1 day pregnancy.  Post-operative Diagnosis: same  Surgeon: Jesenia Spera   Assistants: Amanda Jones, CNM  Anesthesia: General endotracheal anesthesia  Indications:  24 y.o. G3P1011 admitted on 10/10 at [redacted]w[redacted]d for PPROM.  Patient began to experience heavy bright red vaginal bleeding, contractions, and abdominal pain.  Cervix was unchanged from admission wand was 1/50/high.  FHT with variable decelerations.  Decision was made to proceed with cesarean section due to concern for placental abruption.  Procedure Details  The patient was seen in the Holding Room. The risks, benefits, complications, treatment options, and expected outcomes were discussed with the patient.  The patient concurred with the proposed plan, giving informed consent.  The site of surgery properly noted/marked. The patient was taken to Operating Room, identified as Derya M Francis and the procedure verified as C-Section Delivery. A Time Out was held and the above information confirmed.  After induction of anesthesia, the patient was draped and prepped in the usual sterile manner. A Pfannenstiel incision was made and carried down through the subcutaneous tissue to the fascia. Fascial incision was made and extended bluntly.  The peritoneum was identified and entered. Peritoneal incision was extended longitudinally. A low transverse uterine incision was made. Delivered from cephalic presentation was a 1020 gram Female with Apgar scores of 2 at one minute and 7 at five minutes. After the umbilical cord was clamped and cut cord blood was obtained for evaluation. The placenta was removed intact and there was a ~30% abruption noted.. The uterine outline, tubes and ovaries appeared normal with the exception of filmy adhesions over the tubes and ovaries. The uterine incision was closed with running locked  sutures of 0-Monocryl.  A second imbricating layer was used to close the uterus using 0-Monocryl. Hemostasis was observed. Lavage was carried out until clear.  The peritoneum was closed using 2-0 Vicryl. The fascia was then reapproximated with running sutures of 0-PDS.  The subcutaneous tissues were irrigated and closed using 3-0 Monocryl.  The skin was reapproximated with 4-0 Vicryl.  Instrument, sponge, and needle counts were correct prior the abdominal closure and at the conclusion of the case.   Findings: Normal-appearing uterus and bilateral fallopian tubes and ovaries.  Filmy adhesions over fallopian tubes and ovaries.  Fetus in cephalic position.  A ~30% placental abruption was noted.  Estimated Blood Loss:  198cc         Drains: Foley         Total IV Fluids:  2500ml         Specimens: Placenta and Disposition:  Sent to Pathology          Implants: None         Complications:  None; patient tolerated the procedure well.         Disposition: PACU - hemodynamically stable.         Condition: stable  Danielly Ackerley, DO      

## 2019-12-03 NOTE — MAU Provider Note (Signed)
History     CSN: [id number]  Arrival date and time: 12/03/19 0307   First Provider Initiated Contact with Patient 12/03/19 0417      Chief Complaint  Patient presents with  . Vaginal Discharge  . Pelvic Pain   Anita Robinson is a 24 y.o. G3P1011 at [redacted]w[redacted]d who receives care at [facility].  She presents today for Vaginal Discharge and Pelvic Pain.  Patient states she felt a "pop" and started leaking fluid around 0330.  She said that her clothes were saturated and that she "keep pushing and water kept coming out."  Patient endorses fetal movement and denies cramping or contractions.  However, she does report some tenderness below her umbilicus that started after the leaking.  Patient endorses sexual activity today.  She denies vaginal bleeding or discharge prior to the leaking.    OB History    Gravida  3   Para  1   Term  1   Preterm  0   AB  1   Living  1     SAB  1   TAB  0   Ectopic  0   Multiple  0   Live Births  1           Past Medical History:  Diagnosis Date  . Anemia    on Iron  . Complication of anesthesia    has never had any type of anesthesia.  . Low iron     Past Surgical History:  Procedure Laterality Date  . NO PAST SURGERIES      Family History  Problem Relation Age of Onset  . Asthma Mother   . Asthma Sister   . Asthma Brother   . Alcohol abuse Neg Hx   . Arthritis Neg Hx   . Birth defects Neg Hx   . Cancer Neg Hx   . COPD Neg Hx   . Depression Neg Hx   . Diabetes Neg Hx   . Drug abuse Neg Hx   . Early death Neg Hx   . Hearing loss Neg Hx   . Heart disease Neg Hx   . Hyperlipidemia Neg Hx   . Hypertension Neg Hx   . Kidney disease Neg Hx   . Learning disabilities Neg Hx   . Mental illness Neg Hx   . Mental retardation Neg Hx   . Miscarriages / Stillbirths Neg Hx   . Stroke Neg Hx   . Vision loss Neg Hx   . Varicose Veins Neg Hx     Social History   Tobacco Use  . Smoking status: Never Smoker  . Smokeless tobacco:  Never Used  Vaping Use  . Vaping Use: Never used  Substance Use Topics  . Alcohol use: Not Currently    Comment: social drinker  . Drug use: No    Allergies:  Allergies  Allergen Reactions  . Eggs Or Egg-Derived Products     Pt says she get bumps     Medications Prior to Admission  Medication Sig Dispense Refill Last Dose  . Prenatal Vit-Fe Fumarate-FA (PRENATAL MULTIVITAMIN) TABS tablet Take 1 tablet by mouth daily at 12 noon.   12/03/2019 at Unknown time  . acetaminophen (TYLENOL) 325 MG tablet Take 2 tablets (650 mg total) by mouth every 4 (four) hours as needed. 100 tablet 2   . Doxylamine-Pyridoxine 10-10 MG TBEC Start with 2 tablets every evening. If symptoms persist, add 1 tablet every morning. If symptoms persist, add 1 tablet  at mid-day. 120 tablet 0     Review of Systems  Genitourinary: Negative for difficulty urinating, dysuria and vaginal bleeding.   Physical Exam   Blood pressure 114/68, pulse (!) 122, temperature 98.3 F (36.8 C), resp. rate 16, height 5\' 4"  (1.626 m), weight 81.6 kg, last menstrual period 05/02/2019, SpO2 100 %, unknown if currently breastfeeding.  Physical Exam Constitutional:      Appearance: Normal appearance.  HENT:     Head: Normocephalic and atraumatic.  Eyes:     Conjunctiva/sclera: Conjunctivae normal.  Cardiovascular:     Rate and Rhythm: Normal rate.  Pulmonary:     Effort: Pulmonary effort is normal. No respiratory distress.  Abdominal:     Palpations: Abdomen is soft.     Comments: Gravid, Soft, Mild tenderness ~3FB below umbilicus, but otherwise nontender.   Genitourinary:    Comments: Sterile Speculum Exam: -Normal External Genitalia: Non tender, no apparent discharge at introitus.  -Vaginal Vault: Pink mucosa with good rugae. Copious amt clear fluid noted -wet prep collected -Cervix:Pink, no lesions, cysts, or polyps.  Appears closed. No active bleeding from os-Leaking noted with valsalva maneuver GC/CT  collected -Bimanual Exam: Dilation: 1.5 Effacement (%): 50 Station: Ballotable Exam by:: [email or web address] CNM  Musculoskeletal:        General: Normal range of motion.     Cervical back: Normal range of motion.  Skin:    General: Skin is warm and dry.  Neurological:     Mental Status: She is alert and oriented to person, place, and time.  Psychiatric:        Mood and Affect: Mood normal.        Behavior: Behavior normal.        Thought Content: Thought content normal.     Fetal Assessment 145 bpm, Mod Var, -Decels, +Accels Toco: Irregular with irritability  MAU Course   Results for orders placed or performed during the hospital encounter of 12/03/19 (from the past 24 hour(s))  Urinalysis, Routine w reflex microscopic Urine, Clean Catch     Status: Abnormal   Collection Time: 12/03/19  3:33 AM  Result Value Ref Range   Color, Urine YELLOW YELLOW   APPearance CLEAR CLEAR   Specific Gravity, Urine 1.016 1.005 - 1.030   pH 6.0 5.0 - 8.0   Glucose, UA NEGATIVE NEGATIVE mg/dL   Hgb urine dipstick SMALL (A) NEGATIVE   Bilirubin Urine NEGATIVE NEGATIVE   Ketones, ur NEGATIVE NEGATIVE mg/dL   Protein, ur NEGATIVE NEGATIVE mg/dL   Nitrite NEGATIVE NEGATIVE   Leukocytes,Ua NEGATIVE NEGATIVE   RBC / HPF 11-20 0 - 5 RBC/hpf   WBC, UA 0-5 0 - 5 WBC/hpf   Bacteria, UA NONE SEEN NONE SEEN   Squamous Epithelial / LPF 0-5 0 - 5   Mucus PRESENT   Fern Test     Status: Normal   Collection Time: 12/03/19  4:00 AM  Result Value Ref Range   POCT Fern Test Positive = ruptured amniotic membanes   Wet prep, genital     Status: Abnormal   Collection Time: 12/03/19  4:11 AM  Result Value Ref Range   Yeast Wet Prep HPF POC NONE SEEN NONE SEEN   Trich, Wet Prep NONE SEEN NONE SEEN   Clue Cells Wet Prep HPF POC NONE SEEN NONE SEEN   WBC, Wet Prep HPF POC FEW (A) NONE SEEN   Sperm NONE SEEN   Respiratory Panel by RT PCR (Flu A&B, Covid) - Nasopharyngeal Swab  Status: None   Collection Time:  12/03/19  4:35 AM   Specimen: Nasopharyngeal Swab  Result Value Ref Range   SARS Coronavirus 2 by RT PCR NEGATIVE NEGATIVE   Influenza A by PCR NEGATIVE NEGATIVE   Influenza B by PCR NEGATIVE NEGATIVE  Type and screen [facility]     Status: None   Collection Time: 12/03/19  4:40 AM  Result Value Ref Range   ABO/RH(D) O POS    Antibody Screen NEG    Sample Expiration      12/06/2019,2359 Performed at [facility], [address] [place], [place] [address]   CBC on admission     Status: Abnormal   Collection Time: 12/03/19  5:37 AM  Result Value Ref Range   WBC 8.5 4.0 - 10.5 K/uL   RBC 3.95 3.87 - 5.11 MIL/uL   Hemoglobin 9.6 (L) 12.0 - 15.0 g/dL   HCT 32.4 (L) 36 - 46 %   MCV 79.0 (L) 80.0 - 100.0 fL   MCH 24.3 (L) 26.0 - 34.0 pg   MCHC 30.8 30.0 - 36.0 g/dL   RDW 40.1 02.7 - 25.3 %   Platelets 284 150 - 400 K/uL   nRBC 0.0 0.0 - 0.2 %   No results found.  MDM PE Labs: Wet Prep, GC/CT, Fern EFM BMZ Assessment and Plan  24 year old G3P1011  SIUP at 28.1weeks Cat I FT pPROM  -Patient informed that [place] reviewed and significant for pPROM. -Discussed POC to include pelvic exam, cultures, and BMZ. -Exam performed and findings discussed. Confirmed pPROM. Cultures collected.  -Educated on BMZ including what it is, why it is used, and benefits. -Patient agreeable and will order. -Reassured that if no labor patient would be allowed to go to at least [redacted] weeks GA before delivery. -Patient with questions regarding infant with delivery prior to term.  Informed that these questions are best addressed by neonatologist and that she will likely have a consult.  -Reviewed potential [place] to confirm presentation. -Comfort and reassurance given.  -D. Renae Fickle CNM contacted and informed of patient status, exam, and recommendation.   -Care assumed by [facility].   [name] MSN, CNM 12/03/2019, 4:17 AM

## 2019-12-03 NOTE — Anesthesia Procedure Notes (Signed)
Procedure Name: Intubation Date/Time: 12/03/2019 12:44 PM Performed by: Rica Records, CRNA Pre-anesthesia Checklist: Patient identified, Emergency Drugs available, Suction available and Patient being monitored Patient Re-evaluated:Patient Re-evaluated prior to induction Oxygen Delivery Method: Circle system utilized Preoxygenation: Pre-oxygenation with 100% oxygen Induction Type: IV induction, Rapid sequence and Cricoid Pressure applied Laryngoscope Size: Glidescope Grade View: Grade I Tube type: Oral Tube size: 7.0 mm Number of attempts: 1 Airway Equipment and Method: Video-laryngoscopy Placement Confirmation: ETT inserted through vocal cords under direct vision Secured at: 22 cm Tube secured with: Tape Dental Injury: Teeth and Oropharynx as per pre-operative assessment

## 2019-12-03 NOTE — Progress Notes (Signed)
EFM off for ultrasound.

## 2019-12-04 DIAGNOSIS — O99019 Anemia complicating pregnancy, unspecified trimester: Secondary | ICD-10-CM | POA: Diagnosis present

## 2019-12-04 DIAGNOSIS — O9902 Anemia complicating childbirth: Secondary | ICD-10-CM | POA: Diagnosis present

## 2019-12-04 LAB — GC/CHLAMYDIA PROBE AMP (~~LOC~~) NOT AT ARMC
Chlamydia: NEGATIVE
Comment: NEGATIVE
Comment: NORMAL
Neisseria Gonorrhea: NEGATIVE

## 2019-12-04 LAB — CBC
HCT: 25.6 % — ABNORMAL LOW (ref 36.0–46.0)
Hemoglobin: 7.9 g/dL — ABNORMAL LOW (ref 12.0–15.0)
MCH: 24.2 pg — ABNORMAL LOW (ref 26.0–34.0)
MCHC: 30.9 g/dL (ref 30.0–36.0)
MCV: 78.3 fL — ABNORMAL LOW (ref 80.0–100.0)
Platelets: 251 10*3/uL (ref 150–400)
RBC: 3.27 MIL/uL — ABNORMAL LOW (ref 3.87–5.11)
RDW: 13.6 % (ref 11.5–15.5)
WBC: 17.3 10*3/uL — ABNORMAL HIGH (ref 4.0–10.5)
nRBC: 0 % (ref 0.0–0.2)

## 2019-12-04 MED ORDER — MAGNESIUM OXIDE 400 (241.3 MG) MG PO TABS
400.0000 mg | ORAL_TABLET | Freq: Every day | ORAL | Status: DC
  Administered 2019-12-04 – 2019-12-05 (×2): 400 mg via ORAL
  Filled 2019-12-04 (×2): qty 1

## 2019-12-04 MED ORDER — IBUPROFEN 600 MG PO TABS
600.0000 mg | ORAL_TABLET | Freq: Four times a day (QID) | ORAL | Status: DC
  Administered 2019-12-04 – 2019-12-05 (×4): 600 mg via ORAL
  Filled 2019-12-04 (×4): qty 1

## 2019-12-04 MED ORDER — POLYSACCHARIDE IRON COMPLEX 150 MG PO CAPS
150.0000 mg | ORAL_CAPSULE | Freq: Every day | ORAL | Status: DC
  Administered 2019-12-04 – 2019-12-05 (×2): 150 mg via ORAL
  Filled 2019-12-04 (×2): qty 1

## 2019-12-04 MED ORDER — SODIUM CHLORIDE 0.9 % IV SOLN
500.0000 mg | Freq: Once | INTRAVENOUS | Status: AC
  Administered 2019-12-04: 500 mg via INTRAVENOUS
  Filled 2019-12-04: qty 25

## 2019-12-04 MED ORDER — SODIUM CHLORIDE 0.9 % IV SOLN: 510.0000 mg | Freq: Once | INTRAVENOUS | Status: DC

## 2019-12-04 NOTE — Lactation Note (Signed)
This note was copied from a baby's chart. Lactation Consultation Note  Patient Name: Anita Robinson [id number] Date: 12/04/2019 Reason for consult: Follow-up assessment;NICU baby LC f/u with patient who has not pumped since yesterday's consult. Reinforced earlier ed and offered to assist with pumping. Patient agreed. Patient provided with opportunity to ask questions. No questions or concerns at this time. Encouraged pt to pump q 3 hours for breast stimulation and to provide colostrum for baby. Pt pumping when LC left room.   Maternal Data  Pt with low hemoglobin.    Interventions Interventions: Breast feeding basics reviewed;Breast compression;Expressed milk;DEBP   Consult Status Consult Status: Follow-up Date: 12/05/19 Follow-up type: In-patient    Elder Negus 12/04/2019, 2:25 PM

## 2019-12-04 NOTE — Progress Notes (Signed)
CSW completed chart review and attempted to meet with MOB to complete psychosocial assessment however, MOB requested that CSW come back at a later time due to her inability to stay awake. CSW agreed to meet with MOB tomorrow, MOB agreeable.   Hurbert Duran, LCSW Clinical Social Worker [facility] Cell#: [phone] 

## 2019-12-04 NOTE — Progress Notes (Signed)
Subjective: POD# 1 Live born female  Birth Weight: 2 lb 4 oz (1020 g) APGAR: 2, 7  Newborn Delivery   Birth date/time: 12/03/2019 12:06:00 Delivery type: C-Section, Low Transverse Trial of labor: No C-section categorization: Primary     Baby name: Kamoura Delivering provider: DAVIES, MELISSA    Feeding: breast/pumping  Pain control at delivery: General   Reports feeling sore but well, just returned from NICU visit.  Baby doing well, stable.  Patient reports tolerating PO.   Breast symptoms:none Pain controlled with acetaminophen, ibuprofen (OTC) and prescription NSAID's including Toradol Denies HA/SOB/C/P/N/V/dizziness. Flatus absent. She reports vaginal bleeding as normal, without clots.  She is ambulating, urinating without difficulty.     Objective:   VS:    Vitals:   12/03/19 1641 12/03/19 1853 12/03/19 2346 12/04/19 0549  BP: 110/72 120/69 109/68 104/73  Pulse: 72 86 64 66  Resp: 20 18 18 18   Temp: 98.8 F (37.1 C) 98.9 F (37.2 C) 99 F (37.2 C) 97.7 F (36.5 C)  TempSrc:   Oral Oral  SpO2: 99% 98%    Weight:      Height:          Intake/Output Summary (Last 24 hours) at 12/04/2019 02/03/2020 Last data filed at 12/04/2019 0400 Gross per 24 hour  Intake 2680 ml  Output 2748 ml  Net -68 ml        Recent Labs    12/03/19 1218 12/04/19 0533  WBC 10.2 17.3*  HGB 9.5* 7.9*  HCT 32.1* 25.6*  PLT 253 251     Blood type: --/--/O POS (10/10 0440)  Rubella:   immune    Physical Exam:  General: alert, cooperative and no distress CV: Regular rate and rhythm Resp: clear Abdomen: soft, nontender, normal bowel sounds Incision: dry, intact and serous drainage present / small amount Uterine Fundus: firm, below umbilicus, nontender Lochia: minimal Ext: no edema, redness or tenderness in the calves or thighs      Assessment/Plan: 24 y.o.   POD# 1. 30                  Principal Problem:   Postpartum care following cesarean delivery  10/10 Active Problems:   Preterm premature rupture of membranes (PPROM) antepartum   Placental abruption   Status post primary low transverse cesarean section 10/10   Maternal anemia, with delivery - IDA w/ superimposed ABL anemia  - symptomatic - fatigue  - IV Venofer today  - Start oral Fe and Mag ox  Doing well, stable.               Advance diet as tolerated Encourage rest when baby rests Breastfeeding support Encourage to ambulate, warm fluids for gut motility Routine post-op care  12/10, CNM, MSN 12/04/2019, 8:12 AM

## 2019-12-05 LAB — CULTURE, BETA STREP (GROUP B ONLY)

## 2019-12-05 LAB — SURGICAL PATHOLOGY

## 2019-12-05 MED ORDER — SERTRALINE HCL 50 MG PO TABS
50.0000 mg | ORAL_TABLET | Freq: Every day | ORAL | 2 refills | Status: DC
Start: 2019-12-05 — End: 2020-10-09

## 2019-12-05 MED ORDER — SERTRALINE HCL 50 MG PO TABS
50.0000 mg | ORAL_TABLET | Freq: Every day | ORAL | Status: DC
  Administered 2019-12-05: 50 mg via ORAL
  Filled 2019-12-05 (×2): qty 1

## 2019-12-05 MED ORDER — POLYSACCHARIDE IRON COMPLEX 150 MG PO CAPS
150.0000 mg | ORAL_CAPSULE | Freq: Every day | ORAL | 0 refills | Status: DC
Start: 2019-12-05 — End: 2020-10-09

## 2019-12-05 MED ORDER — OXYCODONE HCL 5 MG PO TABS: 5.0000 mg | ORAL_TABLET | ORAL | 0 refills | Status: DC | PRN

## 2019-12-05 MED ORDER — MAGNESIUM OXIDE 400 (241.3 MG) MG PO TABS
400.0000 mg | ORAL_TABLET | Freq: Every day | ORAL | 1 refills | Status: DC
Start: 2019-12-05 — End: 2020-10-09

## 2019-12-05 MED ORDER — IBUPROFEN 600 MG PO TABS
600.0000 mg | ORAL_TABLET | Freq: Four times a day (QID) | ORAL | 0 refills | Status: DC
Start: 2019-12-05 — End: 2020-02-29

## 2019-12-05 NOTE — Clinical Social Work Maternal (Signed)
CLINICAL SOCIAL WORK MATERNAL/CHILD NOTE  Patient Details  Name: Kurtis Bushman MRN: [id number] Date of Birth: 02/21/1996  Date:  11-Sep-2019  Clinical Social Worker Initiating Note:  Abundio Miu, [place] Date/Time: Initiated:  12/05/19/1057     Child's Name:  Caroleen Hamman   Biological Parents:  Mother, Father (Father: Starr Urias)   Need for Interpreter:  None   Reason for Referral:  Behavioral Health Concerns, Other (Comment) (NICU Admission)   Address:  [address] [place] [address] Mailing Address CSW asked for physical address, MOB reported that she is unaware how much longer she is going to be at current physical address.   Phone number:  [phone] (home)     Additional phone number:   Household Members/Support Persons (HM/SP):   Household Member/Support Person 1   HM/SP Name Relationship DOB or Age  HM/SP -1 Illene Silver son 12/23/13  HM/SP -2        HM/SP -3        HM/SP -4        HM/SP -5        HM/SP -6        HM/SP -7        HM/SP -8          Natural Supports (not living in the home):  Spouse/significant other, Immediate Family   Professional Supports: None   Employment: Animator   Type of Work: Careers information officer   Education:  Fate arranged:    Museum/gallery curator Resources:  [employer]   Other Resources:  [employer], Physicist, medical    Cultural/Religious Considerations Which May Impact Care:    Strengths:  Ability to meet basic needs , Understanding of illness   Psychotropic Medications:         Pediatrician:       Pediatrician List:   [place]      Pediatrician Fax Number:    Risk Factors/Current Problems:  Mental Health Concerns    Cognitive State:  Able to Concentrate , Alert , Goal Oriented , Linear Thinking , Insightful    Mood/Affect:  Calm , Interested , Flat    CSW Assessment: CSW met with MOB at bedside to  discuss consult for behavioral health concerns (edinburgh score 15) and infant's NICU admission. CSW reintroduced self and explained reason for consult. MOB presented calm and remained engaged during assessment. 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 MOB reported that she receives both [facility] and food stamps. MOB reported that she has started to shop for infant and does not anticipate needing any assistance obtaining items for infant. MOB reported that she has a car seat already. CSW inquired about MOB's support system, MOB reported that FOB, her grandma, parents and brother are supports.   CSW inquired about MOB's mental health history. MOB reported that she was diagnosed with depression this year and prescribed Zoloft. MOB reported that she has not been taking it due to it causing nausea. MOB reported that she restarted the Zoloft today. CSW inquired about MOB's depressive symptoms, MOB reported that she just feels sad. CSW acknowledged MOB's feelings and positively affirmed MOB restarting medication. MOB denied any other mental health history. CSW and MOB discussed edinburgh score 15. MOB reported that she has been stressed and overwhelmed over the past seven days due  to her pregnancy. CSW asked if MOB was still feeling stressed and overwhelmed, MOB reported that she didn't know. CSW inquired about how MOB was feeling emotionally after giving birth, MOB reported that she was feeling bitter sweet. MOB elaborated and reported that she is happy that infant is alive and getting healthy. MOB reported that she is bitter about not being able to take infant home. CSW acknowledged and validated MOB's feelings. MOB presented calm and did not demonstrate any acute mental health signs/symptoms. MOB possessed some insight about her mental health. CSW assessed for safety, MOB denied SI, HI and domestic violence.   CSW provided education regarding the baby blues period vs.  perinatal mood disorders, discussed treatment and gave resources for mental health follow up if concerns arise.  CSW recommends self-evaluation during the postpartum time period using the New Mom Checklist from Postpartum Progress and encouraged MOB to contact a medical professional if symptoms are noted at any time. CSW encouraged MOB to keep a close track of any postpartum depression signs/symptoms.   CSW provided review of Sudden Infant Death Syndrome (SIDS) precautions. MOB verbalized understanding and reported that infant would sleep in a crib.   CSW and MOB discussed infant's NICU admission. MOB reported that she feels well informed about infant's care. CSW informed MOB about the NICU, what to expect and resources/supports available while infant is admitted to the NICU. MOB reported that meal vouchers will be helpful, CSW agreed to leave meal vouchers at infant's bedside. MOB denied any transportation barriers with visiting infant in the NICU. MOB denied any questions/concerns regarding the NICU. CSW informed MOB that infant meets the criteria to apply for SSI benefits and explained the benefits/process. MOB reported that she was interested, CSW provided SSI paperwork. MOB denied any additional questions.   CSW will continue to offer resources/supports while infant is admitted to the NICU.   CSW Plan/Description:  Sudden Infant Death Syndrome (SIDS) Education, Perinatal Mood and Anxiety Disorder (PMADs) Education, Other Patient/Family Education, Supplemental Security Income (SSI) Information    Esme Freund L Georgianne Gritz, LCSW 12/05/2019, 11:13 AM 

## 2019-12-05 NOTE — Discharge Summary (Signed)
Postpartum Discharge Summary  Date of Service updated 12/05/19     Patient Name: Anita Robinson DOB: 1995-07-08 MRN: [id number]  Date of admission: 12/03/2019 Delivery date:12/03/2019  Delivering provider: Drema Dallas  Date of discharge: 12/05/2019  Admitting diagnosis: Preterm premature rupture of membranes (PPROM) with onset of labor after 24 hours of rupture in second trimester, antepartum [O42.112] Intrauterine pregnancy: [redacted]w[redacted]d     Secondary diagnosis:  Principal Problem:   Postpartum care following cesarean delivery 10/10 Active Problems:   Preterm premature rupture of membranes (PPROM) antepartum   Placental abruption   Status post primary low transverse cesarean section 10/10   Maternal anemia, with delivery - IDA w/ superimposed ABL anemia  Additional problems: none    Discharge diagnosis: Preterm Pregnancy Delivered and Anemia                                              Post partum procedures:IV Venofer for symptomatic anemia Augmentation: N/A Complications: None  Hospital course: Onset of Labor With Unplanned C/S   24 y.o. yo [id number] at [redacted]w[redacted]d was admitted for premature preterm rupture of membranes on 12/03/2019. Patient had a labor course significant for bright red vaginal bleeding and abdominal pain. The patient went for cesarean section due to vaginal bleeding suspicious of placental abruption and remote from delivery. Delivery details as follows: Membrane Rupture Time/Date: 3:30 AM ,12/03/2019   Delivery Method:C-Section, Low Transverse  Details of operation can be found in separate operative note. Patient had an uncomplicated postpartum course.  She is ambulating,tolerating a regular diet, passing flatus, and urinating well.  Patient is discharged home in stable condition 12/05/19.  Newborn Data: Birth date:12/03/2019  Birth time:12:06 PM  Gender:Female  Living status:Living  Apgars:2 ,7  Weight:1020 g   Magnesium Sulfate received: Yes:  Neuroprotection BMZ received: Yes Rhophylac:N/A MMR:N/A Transfusion:No  Physical exam  Vitals:   12/04/19 0549 12/04/19 1438 12/04/19 2105 12/05/19 0538  BP: 104/73 103/67 106/61 109/72  Pulse: 66 61 66 (!) 57  Resp: $Remo'18 17  18  'MlFUl$ Temp: 97.7 F (36.5 C) 98.6 F (37 C) 98.7 F (37.1 C) 98.1 F (36.7 C)  TempSrc: Oral Oral Oral Oral  SpO2:  100%  100%  Weight:      Height:       General: alert, cooperative and no distress Lochia: appropriate Uterine Fundus: firm Incision: Dressing is clean, dry, and intact DVT Evaluation: No evidence of DVT seen on physical exam. No cords or calf tenderness. No significant calf/ankle edema. Labs: Lab Results  Component Value Date   WBC 17.3 (H) 12/04/2019   HGB 7.9 (L) 12/04/2019   HCT 25.6 (L) 12/04/2019   MCV 78.3 (L) 12/04/2019   PLT 251 12/04/2019   CMP Latest Ref Rng & Units 06/17/2019  Glucose 70 - 99 mg/dL 99  BUN 6 - 20 mg/dL 11  Creatinine 0.44 - 1.00 mg/dL 0.69  Sodium 135 - 145 mmol/L 137  Potassium 3.5 - 5.1 mmol/L 3.7  Chloride 98 - 111 mmol/L 102  CO2 22 - 32 mmol/L 24  Calcium 8.9 - 10.3 mg/dL 9.3  Total Protein 6.5 - 8.1 g/dL 6.9  Total Bilirubin 0.3 - 1.2 mg/dL 0.4  Alkaline Phos 38 - 126 U/L 50  AST 15 - 41 U/L 19  ALT 0 - 44 U/L 12   Edinburgh Score: No flowsheet data found.  After visit meds:  Allergies as of 12/05/2019      Reactions   Eggs Or Egg-derived Products Rash      Medication List    STOP taking these medications   Doxylamine-Pyridoxine 10-10 MG Tbec     TAKE these medications   acetaminophen 325 MG tablet Commonly known as: Tylenol Take 2 tablets (650 mg total) by mouth every 4 (four) hours as needed.   ibuprofen 600 MG tablet Commonly known as: ADVIL Take 1 tablet (600 mg total) by mouth every 6 (six) hours.   iron polysaccharides 150 MG capsule Commonly known as: NIFEREX Take 1 capsule (150 mg total) by mouth daily.   magnesium oxide 400 (241.3 Mg) MG tablet Commonly  known as: MAG-OX Take 1 tablet (400 mg total) by mouth daily.   oxyCODONE 5 MG immediate release tablet Commonly known as: Oxy IR/ROXICODONE Take 1-2 tablets (5-10 mg total) by mouth every 4 (four) hours as needed for moderate pain.   prenatal multivitamin Tabs tablet Take 1 tablet by mouth daily at 12 noon.   sertraline 50 MG tablet Commonly known as: ZOLOFT Take 1 tablet (50 mg total) by mouth daily.        Discharge home in stable condition Infant Feeding: Breast Infant Disposition:NICU Discharge instruction: per After Visit Summary and Postpartum booklet. Activity: Advance as tolerated. Pelvic rest for 6 weeks.  Diet: routine diet Anticipated Birth Control: undecided, Depo and COC in the past Postpartum Appointment:6 weeks Additional Postpartum F/U: Postpartum Depression checkup, will start Zoloft prior to discharge Future Appointments:No future appointments. Follow up Visit:  [facility]. Go in 6 week(s).   Specialty: Obstetrics and Gynecology Contact information: [address] [place] [address] [phone]                  12/05/2019 Arrie Eastern, CNM

## 2019-12-05 NOTE — Discharge Instructions (Signed)

## 2019-12-06 ENCOUNTER — Ambulatory Visit: Payer: Self-pay

## 2019-12-06 DIAGNOSIS — R011 Cardiac murmur, unspecified: Secondary | ICD-10-CM | POA: Diagnosis not present

## 2019-12-06 NOTE — Lactation Note (Signed)
This note was copied from a baby's chart. Lactation Consultation Note  Patient Name: Girl Meredyth Hornung RFXJO'I Date: 12/06/2019 Reason for consult: Follow-up assessment  P2 mother whose infant is now 40 hours old.  This is a preterm baby born at 28+1 weeks with a CGA of 28+4 weeks weighing < 3 lbs and in the NICU.  Mother had no questions/concerns regarding pumping; denies pain with pumping.  She has been obtaining approximately 40 mls/session and has EBM in refrigerator.  Encouraged mother to continue pumping every three hours. She will include hand expression before pumping to aid in milk supply.    Mother has a Medela DEBP for home use.  She has not been using our hospital grade pump at all and I encouraged her to do so when visiting her baby.  Offered to obtain pump parts and review set up as needed.  Mother not interested at this time, however, she will let her RN know if she wants to begin pumping in the NICU.  Father present.    Maternal Data    Feeding Feeding Type: Breast Milk  LATCH Score                   Interventions    Lactation Tools Discussed/Used     Consult Status Consult Status: PRN Date: 12/06/19 Follow-up type: Call as needed    Manas Hickling R Reegan Mctighe 12/06/2019, 4:00 PM

## 2019-12-25 DIAGNOSIS — Z419 Encounter for procedure for purposes other than remedying health state, unspecified: Secondary | ICD-10-CM | POA: Diagnosis not present

## 2020-01-01 DIAGNOSIS — Z113 Encounter for screening for infections with a predominantly sexual mode of transmission: Secondary | ICD-10-CM | POA: Diagnosis not present

## 2020-01-01 DIAGNOSIS — N760 Acute vaginitis: Secondary | ICD-10-CM | POA: Diagnosis not present

## 2020-01-16 DIAGNOSIS — D649 Anemia, unspecified: Secondary | ICD-10-CM | POA: Diagnosis not present

## 2020-01-18 ENCOUNTER — Ambulatory Visit: Payer: Self-pay

## 2020-01-18 NOTE — Lactation Note (Signed)
This note was copied from a baby's chart. Lactation Consultation Note  Patient Name: Anita Robinson [id number] Date: 01/18/2020   LC attempted to visit with infant's mother. Per RN, she was here earlier and is coming back tonight to deliver EBM. LC will attempt to visit again this evening.    Elder Negus 01/18/2020, 5:14 PM

## 2020-01-24 DIAGNOSIS — Z419 Encounter for procedure for purposes other than remedying health state, unspecified: Secondary | ICD-10-CM | POA: Diagnosis not present

## 2020-01-25 ENCOUNTER — Ambulatory Visit: Payer: Self-pay

## 2020-01-25 NOTE — Lactation Note (Addendum)
This note was copied from a baby's chart. Lactation Consultation Note  Patient Name: Anita Robinson [id number] Date: 01/25/2020 Reason for consult: Follow-up assessment;NICU baby;Preterm <34wks;Infant < 6lbs  Lactation follow up with Anita Robinson today and her 27 week old daughter Anita Robinson (39 and 5 PMA). Her last in person consult was 12/06/19, but she did receive a phone call from lactation about two weeks ago.  Anita Robinson states that overall pumping is going well. She states that she does not typically pump as often as every three hours. In the am, she can pump up to 4 ounces from her right breast and up to 1 ounce from her left. She states that she never pumps more than one ounce from her left.  Upon visual inspection of the breasts, I noted the the left nipple appeared slightly larger than the right. She showed me her DEBP flanges, and they were size 24. She states that she uses vasoline to help with pumping comfort. I stated that 24 appeared to be too small for her tissue. I provided her with 27 flanges to try.  Anita Robinson was about to leave at this time. I did not have the opportunity to observe her pump. However, she is interested in a follow up pumping consult, and we made an appointment for her for 01/26/20 at 1100. We discussed that when we see her pump for several minutes, we can determine if 27 is the correct size. She describes her nipples as looking like "sausages" squeezed in the flanges.  Appointment made for 12/3. I recommended that she try to increase her frequency of pumping sessions in anticipation of meeting baby's intake needs as she grows. At this time, Anita Robinson states that she is having to dip into her frozen breast milk supply. I suggested that she track her pumping sessions (times/amount) for several days so we have an overall picture of how much she expresses.  Feeding Feeding Type: Breast Milk  Interventions Interventions: Breast feeding basics  reviewed;DEBP  Lactation Tools Discussed/Used Tools: Flanges Flange Size: 27 Breast pump type: Double-Electric Breast Pump Pump Review: Setup, frequency, and cleaning   Consult Status Consult Status: Follow-up Date: 01/26/20 Follow-up type: In-patient    Walker Shadow 01/25/2020, 1:47 PM

## 2020-01-26 ENCOUNTER — Ambulatory Visit: Payer: Self-pay

## 2020-01-26 NOTE — Lactation Note (Signed)
This note was copied from a baby's chart. Lactation Consultation Note  LC to room for 1100 visit. Mom not present. LC spoke with RN for update. Baby scoring mostly 3's on feeding readiness. Mom continues to pump and bring milk for baby. RN will notify LC if mom presents in infant's room. Will plan f/u visit.    Elder Negus 01/26/2020, 11:00 AM

## 2020-01-26 NOTE — Lactation Note (Signed)
This note was copied from a baby's chart. Lactation Consultation Note  Patient Name: Anita Robinson GUYQI'H Date: 01/26/2020 Reason for consult: Follow-up assessment;Mother's request;NICU baby  LC to room to assist with pumping. Mom currently pumps 3-4 x day with 15oz output. Reviewed need to increase pumping to 8xday. Also reviewed pumping strategies and observed pumping. Consult completed abruptly because baby had desat and needed care. Will plan f/u visit.  Consult Status Consult Status: Follow-up Date: 01/27/20 Follow-up type: In-patient    Elder Negus 01/26/2020, 5:55 PM

## 2020-01-28 ENCOUNTER — Ambulatory Visit: Payer: Self-pay

## 2020-01-28 NOTE — Lactation Note (Signed)
This note was copied from a baby's chart. Lactation Consultation Note  Patient Name: Anita Robinson RCVEL'F Date: 01/28/2020 Reason for consult: Mother's request;NICU baby P66, 4 week old premature infant ( [redacted]w[redacted]d) gestation in NICU. Mom requested LC services due to soreness when pumping with 28 mm breast flange.  Per mom, she use DEBP every 4 hours and receives 3 ounces of EBM from her right breast and only 1 ounce from her left breast when pumping. Mom had recently pumped prior to [facility] entering the room, LC re-fitted mom with 30 mm breast flange which was a good fit. Mom will stop using vaseline on breast and use organic coconut oil instead. Mom will use DEBP every 3 hours instead of 4 hours to keep milk supply up. LC suggested mom hand express after using DEBP for her left breast, mom wanted LC to review with her hand expression. Mom taught back hand expression  on her  left breast she easily expressed 10 mls of colostrum and was still expressing when LC left the room.  LC discussed with mom to attend the [facility] Breastfeeding Support Group that meets weekly and free within the local community. LC gave mom another pamphlet "Breastfeeding Consultant Services [place].    Maternal Data    Feeding Feeding Type: Breast Milk  LATCH Score                   Interventions    Lactation Tools Discussed/Used     Consult Status Consult Status: Follow-up Date: 01/29/20 Follow-up type: In-patient    Danelle Earthly 01/28/2020, 9:56 PM

## 2020-01-30 ENCOUNTER — Ambulatory Visit: Payer: Self-pay

## 2020-01-30 NOTE — Lactation Note (Signed)
This note was copied from a baby's chart. LC made 2 follow up consult attempts today. Will plan f/u visit next week. No charge.     Elder Negus, MA IBCLC 01/30/2020, 3:49 PM

## 2020-02-02 ENCOUNTER — Ambulatory Visit: Payer: Self-pay

## 2020-02-02 NOTE — Lactation Note (Addendum)
This note was copied from a baby's chart. Lactation Consultation Note LC to room for f/u visit. Baby is now 36+6 CGA and demonstrating feeding readiness cues. Mom continues to pump q 4 hours and yields about 5-6 oz per pumping. Mom would like to "practice" bf tomorrow at 1100 feeding. She will pre- pump at 1000-1030. LC to return for assistance. ok'ed with RN.  Patient Name: Anita Robinson HFSFS'E Date: 02/02/2020 Reason for consult: Follow-up assessment;NICU baby   Consult Status Consult Status: Follow-up Date: 02/03/20 (1100 bf feeding assistance appointment scheduled) Follow-up type: In-patient    Elder Negus, MA IBCLC 02/02/2020, 12:05 PM

## 2020-02-03 ENCOUNTER — Ambulatory Visit: Payer: Self-pay

## 2020-02-03 NOTE — Lactation Note (Signed)
This note was copied from a baby's chart. Lactation Consultation Note  Patient Name: Anita Robinson [id number] Date: 02/03/2020 Reason for consult: Follow-up assessment;NICU baby;Early term 37-38.6wks   P2, Visit with 2 mos old infant in NICU.  CGA 37 weeks. Request for appointment to lick and learn.  Infant demonstrated feeding cues. Guided infant to breast and infant opened wide with a short sucking burst x 3.  Infant tired.  Session ended. Encouraged mother to continue pumping at least 8 times a day and before feeding time if possible place baby STS.      Maternal Data    Feeding Feeding Type: Breast Fed  LATCH Score Latch: Repeated attempts needed to sustain latch, nipple held in mouth throughout feeding, stimulation needed to elicit sucking reflex.  Audible Swallowing: None  Type of Nipple: Everted at rest and after stimulation  Comfort (Breast/Nipple): Soft / non-tender  Hold (Positioning): Assistance needed to correctly position infant at breast and maintain latch.  LATCH Score: 6  Interventions Interventions: Assisted with latch;Hand express;DEBP  Lactation Tools Discussed/Used Tools: Pump   Consult Status Consult Status: Follow-up Date: 02/03/20 Follow-up type: In-patient    Dahlia Byes [facility] 02/03/2020, 11:54 AM

## 2020-02-05 ENCOUNTER — Ambulatory Visit: Payer: Self-pay

## 2020-02-05 NOTE — Lactation Note (Signed)
This note was copied from a baby's chart. Lactation Consultation Note LC to room for feeding assistance. Mom pre-pumped and offered empty breast using 35mm shield. LC assisted with slight position adjustments but mom was able to feed independently after that. Baby came off breast q about 10 minutes and showed no further interest in feeding. Left mom and baby sts. Mother was provided with the opportunity to ask questions. All concerns were addressed. Relayed feeding results to RN.   Patient Name: Anita Robinson Date: 02/05/2020 Reason for consult: Follow-up assessment;NICU baby   Feeding Feeding Type: Breast Fed  LATCH Score Latch: Grasps breast easily, tongue down, lips flanged, rhythmical sucking.  Audible Swallowing: A few with stimulation  Type of Nipple: Everted at rest and after stimulation  Comfort (Breast/Nipple): Soft / non-tender  Hold (Positioning): Assistance needed to correctly position infant at breast and maintain latch.  LATCH Score: 8   Consult Status Consult Status: Follow-up Follow-up type: In-patient   Elder Negus, MA IBCLC 02/05/2020, 11:57 AM

## 2020-02-11 ENCOUNTER — Ambulatory Visit: Payer: Self-pay

## 2020-02-11 NOTE — Lactation Note (Signed)
This note was copied from a baby's chart. Lactation Consultation Note  Patient Name: Anita Robinson IHWTU'U Date: 02/11/2020 Reason for consult: Follow-up assessment;NICU baby (IDF)  LC to room for f/u visit. Mom and baby continue to work on po feeding. Mom continues to pump q 3-4 hours with 2-3 oz per pumping yield. Mom requests LC appointment on Tuesday at 1400 for observed bf without nipple shield.    Consult Status Consult Status: Follow-up Follow-up type: In-patient    Elder Negus, MA IBCLC 02/11/2020, 2:35 PM

## 2020-02-14 ENCOUNTER — Ambulatory Visit: Payer: Self-pay

## 2020-02-14 NOTE — Lactation Note (Signed)
This note was copied from a baby's chart. Lactation Consultation Note  Patient Name: Anita Robinson LSLHT'D Date: 02/14/2020 Reason for consult: Follow-up assessment;Difficult latch;NICU baby;Term  LC in to assist/assess with latching baby to the breast.  Baby is AGA [redacted]w[redacted]d and exclusively gavage fed with emerging breastfeeding.  Mom has been pumping consistently as best she can (she is back working FT at her job) and expressing 2-3 oz per pumping.    Mom desires 72 hr protected BFing.    Baby waking and showing feeding cues.  Baby is gavage fed every 90 minutes due to reflux.  Breast massage and hand expressed drops of milk onto nipple.  Baby positioned in football hold on left breast, using pillow support for baby to be at breast height.  Baby showing cues and LC assisted Mom to try to latch without nipple shield.  Baby sucking, but not deeply on breast.  Mom stated "it doesn't hurt too much".  Took baby off and nipple pinched slightly.    Initiated a 24 mm nipple shield, inverting it first to pull nipple well into shield.  Baby a bit fussy, but after a couple attempts, baby latched onto nipple shield.  Adjusted Mom to support baby's head from ear to ear and support breast firmly at base of breast.  Mom taught to use alternate breast compression to increase milk transfer.  Baby noted to have deep jaw extensions, some tucking of lower lip that resolved with gentle chin tugs.  Baby sucked consistently with jaw extensions.  Unable to identify regular swallows.   Pre and post weight done for 10 ml pc weight gain. 1/3 feeding started by gavage.  Mom encouraged to increase pumping frequency to support her milk supply.  Power-pumping reviewed to stimulate her milk supply.    Mom encouraged to offer breast with cues.   Feeding Feeding Type: Breast Fed  LATCH Score Latch: Grasps breast easily, tongue down, lips flanged, rhythmical sucking.  Audible Swallowing: Spontaneous and  intermittent  Type of Nipple: Everted at rest and after stimulation  Comfort (Breast/Nipple): Soft / non-tender  Hold (Positioning): Assistance needed to correctly position infant at breast and maintain latch.  LATCH Score: 9  Interventions Interventions: Breast feeding basics reviewed;Assisted with latch;Skin to skin;Breast massage;Hand express;Reverse pressure;Breast compression;Adjust position;Support pillows;Position options;Expressed milk;DEBP  Lactation Tools Discussed/Used Tools: Pump;Flanges;Nipple Shields Nipple shield size: 24 Flange Size: 24 Breast pump type: Double-Electric Breast Pump   Consult Status Consult Status: Follow-up Date: 02/16/20 Follow-up type: In-patient    Judee Clara 02/14/2020, 8:48 AM

## 2020-02-24 DIAGNOSIS — Z419 Encounter for procedure for purposes other than remedying health state, unspecified: Secondary | ICD-10-CM | POA: Diagnosis not present

## 2020-02-24 NOTE — L&D Delivery Note (Signed)
Delivery Note Labor onset: 10/07/2020  Labor Onset Time: 0200 Complete dilation at 9:09 AM  Onset of pushing at 0913 FHR second stage Cat 1 Analgesia/Anesthesia intrapartum: epidural  Guided pushing with maternal urge. Delivery of a viable female at 33. Fetal head delivered in LOA position.  Nuchal cord: none.  Infant placed on maternal abd, dried, and tactile stim.  Cord double clamped after 2 min and cut by pt's grandmother.   Cord blood sample collected: yes Arterial cord blood sample collected: no  Placenta delivered Sch[place], intact, with 3 VC.  Placenta to L&D. Uterine tone firm, bleeding small  No laceration identified.  Anesthesia: N/A Repair: N/A EBL (mL): 250 Complications: none APGAR: APGAR (1 MIN):  9 APGAR (5 MINS):  9 APGAR (10 MINS):   Mom to postpartum.  Baby to Couplet care / Skin to Skin. Baby boy "[place]", mother requests in-pt circ.   Roma Schanz MSN, CNM 10/07/2020, 9:38 AM

## 2020-02-29 ENCOUNTER — Other Ambulatory Visit: Payer: Self-pay

## 2020-02-29 ENCOUNTER — Encounter (HOSPITAL_COMMUNITY): Payer: Self-pay | Admitting: Obstetrics & Gynecology

## 2020-02-29 ENCOUNTER — Ambulatory Visit: Payer: Self-pay

## 2020-02-29 ENCOUNTER — Inpatient Hospital Stay (HOSPITAL_COMMUNITY)
Admission: AD | Admit: 2020-02-29 | Discharge: 2020-02-29 | Disposition: A | Discharge status: Home / Self Care | Payer: Medicaid Other | Attending: Obstetrics & Gynecology | Admitting: Obstetrics & Gynecology

## 2020-02-29 ENCOUNTER — Inpatient Hospital Stay (HOSPITAL_COMMUNITY): Payer: Medicaid Other

## 2020-02-29 DIAGNOSIS — R109 Unspecified abdominal pain: Secondary | ICD-10-CM

## 2020-02-29 DIAGNOSIS — O208 Other hemorrhage in early pregnancy: Secondary | ICD-10-CM | POA: Insufficient documentation

## 2020-02-29 DIAGNOSIS — N854 Malposition of uterus: Secondary | ICD-10-CM | POA: Diagnosis not present

## 2020-02-29 DIAGNOSIS — O4691 Antepartum hemorrhage, unspecified, first trimester: Secondary | ICD-10-CM | POA: Diagnosis not present

## 2020-02-29 DIAGNOSIS — Z3A01 Less than 8 weeks gestation of pregnancy: Secondary | ICD-10-CM | POA: Insufficient documentation

## 2020-02-29 DIAGNOSIS — O209 Hemorrhage in early pregnancy, unspecified: Secondary | ICD-10-CM

## 2020-02-29 DIAGNOSIS — R102 Pelvic and perineal pain: Secondary | ICD-10-CM | POA: Diagnosis not present

## 2020-02-29 DIAGNOSIS — O26899 Other specified pregnancy related conditions, unspecified trimester: Secondary | ICD-10-CM

## 2020-02-29 DIAGNOSIS — O26891 Other specified pregnancy related conditions, first trimester: Secondary | ICD-10-CM | POA: Diagnosis not present

## 2020-02-29 DIAGNOSIS — O3680X0 Pregnancy with inconclusive fetal viability, not applicable or unspecified: Secondary | ICD-10-CM

## 2020-02-29 LAB — COMPREHENSIVE METABOLIC PANEL
ALT: 16 U/L (ref 0–44)
AST: 18 U/L (ref 15–41)
Albumin: 3.4 g/dL — ABNORMAL LOW (ref 3.5–5.0)
Alkaline Phosphatase: 54 U/L (ref 38–126)
Anion gap: 9 (ref 5–15)
BUN: 9 mg/dL (ref 6–20)
CO2: 22 mmol/L (ref 22–32)
Calcium: 8.6 mg/dL — ABNORMAL LOW (ref 8.9–10.3)
Chloride: 98 mmol/L (ref 98–111)
Creatinine, Ser: 0.77 mg/dL (ref 0.44–1.00)
GFR, Estimated: >60 mL/min (ref >60)
Glucose, Bld: 96 mg/dL (ref 70–99)
Potassium: 3.2 mmol/L — ABNORMAL LOW (ref 3.5–5.1)
Sodium: 129 mmol/L — ABNORMAL LOW (ref 135–145)
Total Bilirubin: 0.3 mg/dL (ref 0.3–1.2)
Total Protein: 6.9 g/dL (ref 6.5–8.1)

## 2020-02-29 LAB — CBC
HCT: 34.5 % — ABNORMAL LOW (ref 36.0–46.0)
Hemoglobin: 10.3 g/dL — ABNORMAL LOW (ref 12.0–15.0)
MCH: 23.3 pg — ABNORMAL LOW (ref 26.0–34.0)
MCHC: 29.9 g/dL — ABNORMAL LOW (ref 30.0–36.0)
MCV: 77.9 fL — ABNORMAL LOW (ref 80.0–100.0)
Platelets: 299 10*3/uL (ref 150–400)
RBC: 4.43 MIL/uL (ref 3.87–5.11)
RDW: 13.5 % (ref 11.5–15.5)
WBC: 7.1 10*3/uL (ref 4.0–10.5)
nRBC: 0 % (ref 0.0–0.2)

## 2020-02-29 LAB — POCT PREGNANCY, URINE: Preg Test, Ur: POSITIVE — AB

## 2020-02-29 LAB — HCG, QUANTITATIVE, PREGNANCY: hCG, Beta Chain, Quant, S: 41167 m[IU]/mL — ABNORMAL HIGH (ref <5)

## 2020-02-29 NOTE — MAU Note (Addendum)
Took upt on 02/19/20 and was positive.Couple days later had some bleeding and then stopped. Started having cramping on Tues. Marland Kitchen LMP 01/26/20

## 2020-02-29 NOTE — Discharge Instructions (Signed)

## 2020-02-29 NOTE — MAU Provider Note (Signed)
Chief Complaint: Abdominal Pain   Event Date/Time   First Provider Initiated Contact with Patient 02/29/20 0155        SUBJECTIVE HPI: Anita Robinson is a 25 y.o. [id number] at [redacted]w[redacted]d by LMP who presents to maternity admissions reporting vaginal bleeding and pelvic cramping since Tuesday.  Had a Cesarean Delivery on 12/03/19.  Never started contraception.  Thinks LMP 12/3 but it only lasted 3 days.  She denies vaginal itching/burning, urinary symptoms, h/a, dizziness, n/v, or fever/chills.     Abdominal Pain This is a new problem. The current episode started yesterday. The onset quality is gradual. The problem occurs intermittently. The problem has been unchanged. The pain is located in the suprapubic region. The abdominal pain does not radiate. Pertinent negatives include no constipation, diarrhea, dysuria, fever, headaches or myalgias. Nothing aggravates the pain. The pain is relieved by nothing. She has tried nothing for the symptoms.   RN Note: Took upt on 02/19/20 and was positive.Couple days later had some bleeding and then stopped. Started having cramping on Tues. Marland Kitchen LMP 01/26/20  Past Medical History:  Diagnosis Date  . Anemia    on Iron  . Complication of anesthesia    has never had any type of anesthesia.  . Low iron    Past Surgical History:  Procedure Laterality Date  . CESAREAN SECTION N/A 12/03/2019   Procedure: CESAREAN SECTION;  Surgeon: Steva Ready, DO;  Location: [facility]S;  Service: Obstetrics;  Laterality: N/A;  . NO PAST SURGERIES     Social History   Socioeconomic History  . Marital status: Single    Spouse name: Not on file  . Number of children: Not on file  . Years of education: Not on file  . Highest education level: Not on file  Occupational History  . Not on file  Tobacco Use  . Smoking status: Never Smoker  . Smokeless tobacco: Never Used  Vaping Use  . Vaping Use: Never used  Substance and Sexual Activity  . Alcohol use: Not Currently     Comment: social drinker  . Drug use: No  . Sexual activity: Yes    Birth control/protection: None  Other Topics Concern  . Not on file  Social History Narrative  . Not on file   Social Determinants of Health   Financial Resource Strain: Not on file  Food Insecurity: Not on file  Transportation Needs: Not on file  Physical Activity: Not on file  Stress: Not on file  Social Connections: Not on file  Intimate Partner Violence: Not on file   No current facility-administered medications on file prior to encounter.   Current Outpatient Medications on File Prior to Encounter  Medication Sig Dispense Refill  . acetaminophen (TYLENOL) 325 MG tablet Take 2 tablets (650 mg total) by mouth every 4 (four) hours as needed. 100 tablet 2  . ibuprofen (ADVIL) 600 MG tablet Take 1 tablet (600 mg total) by mouth every 6 (six) hours. 30 tablet 0  . iron polysaccharides (NIFEREX) 150 MG capsule Take 1 capsule (150 mg total) by mouth daily. 90 capsule 0  . magnesium oxide (MAG-OX) 400 (241.3 Mg) MG tablet Take 1 tablet (400 mg total) by mouth daily. 30 tablet 1  . oxyCODONE (OXY IR/ROXICODONE) 5 MG immediate release tablet Take 1-2 tablets (5-10 mg total) by mouth every 4 (four) hours as needed for moderate pain. 30 tablet 0  . Prenatal Vit-Fe Fumarate-FA (PRENATAL MULTIVITAMIN) TABS tablet Take 1 tablet by mouth daily  at 12 noon.    . sertraline (ZOLOFT) 50 MG tablet Take 1 tablet (50 mg total) by mouth daily. 30 tablet 2   Allergies  Allergen Reactions  . Eggs Or Egg-Derived Products Rash    I have reviewed patient's Past Medical Hx, Surgical Hx, Family Hx, Social Hx, medications and allergies.   ROS:  Review of Systems  Constitutional: Negative for fever.  Gastrointestinal: Positive for abdominal pain. Negative for constipation and diarrhea.  Genitourinary: Negative for dysuria.  Musculoskeletal: Negative for myalgias.  Neurological: Negative for headaches.   Review of Systems  Other  systems negative   Physical Exam  Physical Exam Patient Vitals for the past 24 hrs:  BP Temp Pulse Resp Height Weight  02/29/20 0046 109/60 98.7 F (37.1 C) 63 16 5\' 5"  (1.651 m) 74.4 kg   Constitutional: Well-developed, well-nourished female in no acute distress.  Cardiovascular: normal rate Respiratory: normal effort GI: Abd soft, non-tender.  MS: Extremities nontender, no edema, normal ROM Neurologic: Alert and oriented x 4.  GU: Neg CVAT.  PELVIC EXAM: evaluation begun in waiting room due to high patient volumes.  By the time she got to room, blood work and [place] had been done, so pelvic deferred in light of no current bleeding or severe pain.Marland Kitchen  LAB RESULTS Results for orders placed or performed during the hospital encounter of 02/29/20 (from the past 24 hour(s))  CBC     Status: Abnormal   Collection Time: 02/29/20 12:56 AM  Result Value Ref Range   WBC 7.1 4.0 - 10.5 K/uL   RBC 4.43 3.87 - 5.11 MIL/uL   Hemoglobin 10.3 (L) 12.0 - 15.0 g/dL   HCT 34.5 (L) 36.0 - 46.0 %   MCV 77.9 (L) 80.0 - 100.0 fL   MCH 23.3 (L) 26.0 - 34.0 pg   MCHC 29.9 (L) 30.0 - 36.0 g/dL   RDW 13.5 11.5 - 15.5 %   Platelets 299 150 - 400 K/uL   nRBC 0.0 0.0 - 0.2 %  Comprehensive metabolic panel     Status: Abnormal   Collection Time: 02/29/20 12:56 AM  Result Value Ref Range   Sodium 129 (L) 135 - 145 mmol/L   Potassium 3.2 (L) 3.5 - 5.1 mmol/L   Chloride 98 98 - 111 mmol/L   CO2 22 22 - 32 mmol/L   Glucose, Bld 96 70 - 99 mg/dL   BUN 9 6 - 20 mg/dL   Creatinine, Ser 0.77 0.44 - 1.00 mg/dL   Calcium 8.6 (L) 8.9 - 10.3 mg/dL   Total Protein 6.9 6.5 - 8.1 g/dL   Albumin 3.4 (L) 3.5 - 5.0 g/dL   AST 18 15 - 41 U/L   ALT 16 0 - 44 U/L   Alkaline Phosphatase 54 38 - 126 U/L   Total Bilirubin 0.3 0.3 - 1.2 mg/dL   GFR, Estimated >60 >60 mL/min   Anion gap 9 5 - 15  hCG, quantitative, pregnancy     Status: Abnormal   Collection Time: 02/29/20 12:56 AM  Result Value Ref Range   hCG, Beta  Chain, Quant, S 41,167 (H) <5 mIU/mL  Pregnancy, urine POC     Status: Abnormal   Collection Time: 02/29/20  1:32 AM  Result Value Ref Range   Preg Test, Ur POSITIVE (A) NEGATIVE    --/--/O POS (10/10 0440)  IMAGING US OB LESS THAN 14 WEEKS WITH OB TRANSVAGINAL  Result Date: 02/29/2020 CLINICAL DATA:  Pregnant, vaginal bleeding, LMP 01/26/2020 EXAM: OBSTETRIC <  14 WK [place] AND TRANSVAGINAL OB US TECHNIQUE: Both transabdominal and transvaginal ultrasound examinations were performed for complete evaluation of the gestation as well as the maternal uterus, adnexal regions, and pelvic cul-de-sac. Transvaginal technique was performed to assess early pregnancy. COMPARISON:  None. FINDINGS: Intrauterine gestational sac: Present, single Yolk sac:  Present, single, normal-appearing Embryo:  Not visualized Cardiac Activity: Not applicable MSD: 17 mm   6 w   3 d Subchorionic hemorrhage: A small subchorionic hemorrhage is present, best seen on image # 39. Maternal uterus/adnexae: The uterus is retroverted. A heterogeneously hypoechoic hypervascular mass measuring 2.6 x 3.1 x 4.2 cm is seen within the left uterine fundus compatible with a uterine fibroid. The cervix is closed and is unremarkable. There is no free fluid within the cul-de-sac. The maternal left ovary is unremarkable. The right ovary is not visualized. IMPRESSION: Intrauterine gestational sac. Identified single yolk sac. This roughly estimates the gestational between 5.5 and 6.0 weeks. Follow-up sonography is recommended in 10-14 days to document appropriate progression and better age the gestation. Small subchorionic hemorrhage Electronically Signed   By: Helyn Numbers MD   On: 02/29/2020 03:50     MAU Management/MDM: Ordered usual first trimester r/o ectopic labs.   Pelvic exam and cultures deferred due to recent delivery Will check baseline Ultrasound to rule out ectopic.  This bleeding/pain can represent a normal pregnancy with bleeding,  spontaneous abortion or even an ectopic which can be life-threatening.  The process as listed above helps to determine which of these is present.  Results reviewed Discussed probable gestational age and status of pregnancy so far Have not seen fetus yet so accurate EDC cannot be established yet Pt plans care with [facility]  ASSESSMENT Pregnancy at approximately 5.5wks ([redacted]w[redacted]d by LMP) Bleeding and cramping in early pregnancy Yolk sac seen inside gestational sac  PLAN Discharge home Recommend close followup by primary OB Bleeding and SAB precautions  Pt stable at time of discharge. Encouraged to return here if she develops worsening of symptoms, increase in pain, fever, or other concerning symptoms.    Wynelle Bourgeois CNM, MSN Certified Nurse-Midwife 02/29/2020  1:55 AM

## 2020-02-29 NOTE — Lactation Note (Signed)
This note was copied from a baby's chart. Lactation Consultation Note  Patient Name: Anita Robinson VIFBP'P Date: 02/29/2020   Age:28 m.o.  Mother has discontinued pumping breast milk. Lactation services are complete.    Elder Negus, MA IBCLC 02/29/2020, 1:25 PM

## 2020-03-25 DIAGNOSIS — Z113 Encounter for screening for infections with a predominantly sexual mode of transmission: Secondary | ICD-10-CM | POA: Diagnosis not present

## 2020-03-25 DIAGNOSIS — Z369 Encounter for antenatal screening, unspecified: Secondary | ICD-10-CM | POA: Diagnosis not present

## 2020-03-25 DIAGNOSIS — O3680X9 Pregnancy with inconclusive fetal viability, other fetus: Secondary | ICD-10-CM | POA: Diagnosis not present

## 2020-03-25 DIAGNOSIS — Z3A09 9 weeks gestation of pregnancy: Secondary | ICD-10-CM | POA: Diagnosis not present

## 2020-03-26 DIAGNOSIS — Z419 Encounter for procedure for purposes other than remedying health state, unspecified: Secondary | ICD-10-CM | POA: Diagnosis not present

## 2020-04-22 DIAGNOSIS — N390 Urinary tract infection, site not specified: Secondary | ICD-10-CM | POA: Diagnosis not present

## 2020-04-23 DIAGNOSIS — Z419 Encounter for procedure for purposes other than remedying health state, unspecified: Secondary | ICD-10-CM | POA: Diagnosis not present

## 2020-05-20 DIAGNOSIS — N390 Urinary tract infection, site not specified: Secondary | ICD-10-CM | POA: Diagnosis not present

## 2020-05-24 DIAGNOSIS — Z419 Encounter for procedure for purposes other than remedying health state, unspecified: Secondary | ICD-10-CM | POA: Diagnosis not present

## 2020-06-20 DIAGNOSIS — N390 Urinary tract infection, site not specified: Secondary | ICD-10-CM | POA: Diagnosis not present

## 2020-06-23 DIAGNOSIS — Z419 Encounter for procedure for purposes other than remedying health state, unspecified: Secondary | ICD-10-CM | POA: Diagnosis not present

## 2020-06-27 DIAGNOSIS — Z363 Encounter for antenatal screening for malformations: Secondary | ICD-10-CM | POA: Diagnosis not present

## 2020-06-27 DIAGNOSIS — N898 Other specified noninflammatory disorders of vagina: Secondary | ICD-10-CM | POA: Diagnosis not present

## 2020-07-24 DIAGNOSIS — Z419 Encounter for procedure for purposes other than remedying health state, unspecified: Secondary | ICD-10-CM | POA: Diagnosis not present

## 2020-07-26 ENCOUNTER — Inpatient Hospital Stay (HOSPITAL_BASED_OUTPATIENT_CLINIC_OR_DEPARTMENT_OTHER): Payer: Medicaid Other

## 2020-07-26 ENCOUNTER — Other Ambulatory Visit: Payer: Self-pay

## 2020-07-26 ENCOUNTER — Encounter (HOSPITAL_COMMUNITY): Payer: Self-pay | Admitting: Obstetrics and Gynecology

## 2020-07-26 ENCOUNTER — Inpatient Hospital Stay (HOSPITAL_COMMUNITY)
Admission: AD | Admit: 2020-07-26 | Discharge: 2020-07-26 | Disposition: A | Discharge status: Home / Self Care | Payer: Medicaid Other | Attending: Obstetrics and Gynecology | Admitting: Obstetrics and Gynecology

## 2020-07-26 DIAGNOSIS — O359XX0 Maternal care for (suspected) fetal abnormality and damage, unspecified, not applicable or unspecified: Secondary | ICD-10-CM | POA: Diagnosis not present

## 2020-07-26 DIAGNOSIS — N930 Postcoital and contact bleeding: Secondary | ICD-10-CM

## 2020-07-26 DIAGNOSIS — Z363 Encounter for antenatal screening for malformations: Secondary | ICD-10-CM

## 2020-07-26 DIAGNOSIS — Z79899 Other long term (current) drug therapy: Secondary | ICD-10-CM | POA: Diagnosis not present

## 2020-07-26 DIAGNOSIS — O4692 Antepartum hemorrhage, unspecified, second trimester: Secondary | ICD-10-CM

## 2020-07-26 DIAGNOSIS — R109 Unspecified abdominal pain: Secondary | ICD-10-CM | POA: Diagnosis not present

## 2020-07-26 DIAGNOSIS — O09212 Supervision of pregnancy with history of pre-term labor, second trimester: Secondary | ICD-10-CM

## 2020-07-26 DIAGNOSIS — O26892 Other specified pregnancy related conditions, second trimester: Secondary | ICD-10-CM | POA: Diagnosis not present

## 2020-07-26 DIAGNOSIS — Z3A26 26 weeks gestation of pregnancy: Secondary | ICD-10-CM | POA: Diagnosis not present

## 2020-07-26 DIAGNOSIS — O34219 Maternal care for unspecified type scar from previous cesarean delivery: Secondary | ICD-10-CM | POA: Diagnosis not present

## 2020-07-26 DIAGNOSIS — Z3689 Encounter for other specified antenatal screening: Secondary | ICD-10-CM

## 2020-07-26 DIAGNOSIS — O99891 Other specified diseases and conditions complicating pregnancy: Secondary | ICD-10-CM

## 2020-07-26 LAB — URINALYSIS, ROUTINE W REFLEX MICROSCOPIC
Bilirubin Urine: NEGATIVE
Glucose, UA: NEGATIVE mg/dL
Ketones, ur: NEGATIVE mg/dL
Nitrite: NEGATIVE
Protein, ur: 30 mg/dL — AB
Specific Gravity, Urine: 1.017 (ref 1.005–1.030)
pH: 6 (ref 5.0–8.0)

## 2020-07-26 LAB — WET PREP, GENITAL
Clue Cells Wet Prep HPF POC: NONE SEEN
Sperm: NONE SEEN
Trich, Wet Prep: NONE SEEN
Yeast Wet Prep HPF POC: NONE SEEN

## 2020-07-26 LAB — GC/CHLAMYDIA PROBE AMP (~~LOC~~) NOT AT ARMC
Chlamydia: NEGATIVE
Comment: NEGATIVE
Comment: NORMAL
Neisseria Gonorrhea: NEGATIVE

## 2020-07-26 NOTE — MAU Provider Note (Signed)
History     CSN: [id number]  Arrival date and time: 07/26/20 0423   Event Date/Time   First Provider Initiated Contact with Patient 07/26/20 [phone]      Chief Complaint  Patient presents with  . Vaginal Bleeding   Anita Robinson is a 25 y.o. [id number] at [redacted]w[redacted]d who receives care at [facility].  She presents today for Vaginal Bleeding.  Patient states she had a "gush of blood" after sexual intercourse around 63. She states she also noted blood on the bed.  Patient reports "streaks" upon arrival with wiping. She endorses cramping that was "in the middle, but has moved to the right side" of her abdomen.  She states the cramping is constant and rates it a 4/10.  Patient endorses fetal movement and denies problems during the pregnancy.  She states she is being monitored for "preterm labor," but has not experienced any and has a history of preterm delivery.  Patient reports next appt is scheduled for June 9th.   OB History    Gravida  4   Para  2   Term  1   Preterm  1   AB  1   Living  2     SAB  1   IAB  0   Ectopic  0   Multiple  0   Live Births  2           Past Medical History:  Diagnosis Date  . Anemia    on Iron  . Complication of anesthesia    has never had any type of anesthesia.  . Low iron     Past Surgical History:  Procedure Laterality Date  . CESAREAN SECTION N/A 12/03/2019   Procedure: CESAREAN SECTION;  Surgeon: Steva Ready, DO;  Location: [facility];  Service: Obstetrics;  Laterality: N/A;  . NO PAST SURGERIES      Family History  Problem Relation Age of Onset  . Asthma Mother   . Asthma Sister   . Asthma Brother   . Alcohol abuse Neg Hx   . Arthritis Neg Hx   . Birth defects Neg Hx   . Cancer Neg Hx   . COPD Neg Hx   . Depression Neg Hx   . Diabetes Neg Hx   . Drug abuse Neg Hx   . Early death Neg Hx   . Hearing loss Neg Hx   . Heart disease Neg Hx   . Hyperlipidemia Neg Hx   . Hypertension Neg Hx   . Kidney disease Neg Hx   .  Learning disabilities Neg Hx   . Mental illness Neg Hx   . Mental retardation Neg Hx   . Miscarriages / Stillbirths Neg Hx   . Stroke Neg Hx   . Vision loss Neg Hx   . Varicose Veins Neg Hx     Social History   Tobacco Use  . Smoking status: Never Smoker  . Smokeless tobacco: Never Used  Vaping Use  . Vaping Use: Never used  Substance Use Topics  . Alcohol use: Not Currently    Comment: social drinker  . Drug use: No    Allergies:  Allergies  Allergen Reactions  . Eggs Or Egg-Derived Products Rash    Medications Prior to Admission  Medication Sig Dispense Refill Last Dose  . Prenatal Vit-Fe Fumarate-FA (PRENATAL MULTIVITAMIN) TABS tablet Take 1 tablet by mouth daily at 12 noon.   07/25/2020 at Unknown time  . acetaminophen (TYLENOL) 325 MG  tablet Take 2 tablets (650 mg total) by mouth every 4 (four) hours as needed. 100 tablet 2   . iron polysaccharides (NIFEREX) 150 MG capsule Take 1 capsule (150 mg total) by mouth daily. 90 capsule 0   . magnesium oxide (MAG-OX) 400 (241.3 Mg) MG tablet Take 1 tablet (400 mg total) by mouth daily. 30 tablet 1   . sertraline (ZOLOFT) 50 MG tablet Take 1 tablet (50 mg total) by mouth daily. 30 tablet 2     Review of Systems  Gastrointestinal: Positive for abdominal pain (Cramping). Negative for constipation and diarrhea.  Genitourinary: Positive for vaginal bleeding. Negative for difficulty urinating, dyspareunia, dysuria and vaginal discharge.   Physical Exam   Blood pressure 112/70, pulse 100, temperature 98.6 F (37 C), resp. rate 18, height 5\' 5"  (1.651 m), weight 78 kg, last menstrual period 01/26/2020, not currently breastfeeding.  Physical Exam Constitutional:      Appearance: Normal appearance.  HENT:     Head: Normocephalic and atraumatic.  Eyes:     Conjunctiva/sclera: Conjunctivae normal.  Pulmonary:     Effort: Pulmonary effort is normal.  Genitourinary:    Comments: Speculum Exam: -Normal External Genitalia: Non  tender, no apparent discharge at introitus.  -Vaginal Vault: Pink mucosa with good rugae. Scant amt pinkish red blood in vault removed with faux swab x 1 -wet prep collected -Cervix:Pink, no lesions, cysts, or polyps.  Appears closed. No active bleeding from os-GC/CT collected -Bimanual Exam:  Closed  Musculoskeletal:        General: Normal range of motion.     Cervical back: Normal range of motion.  Skin:    General: Skin is warm and dry.  Neurological:     Mental Status: She is alert and oriented to person, place, and time.  Psychiatric:        Mood and Affect: Mood normal.        Behavior: Behavior normal.        Thought Content: Thought content normal.     Fetal Assessment 150 bpm, Mod Var, +One Decel, +10x10Accels Toco: Q7 min  MAU Course   Results for orders placed or performed during the hospital encounter of 07/26/20 (from the past 24 hour(s))  Urinalysis, Routine w reflex microscopic Urine, Clean Catch     Status: Abnormal   Collection Time: 07/26/20  5:12 AM  Result Value Ref Range   Color, Urine YELLOW YELLOW   APPearance HAZY (A) CLEAR   Specific Gravity, Urine 1.017 1.005 - 1.030   pH 6.0 5.0 - 8.0   Glucose, UA NEGATIVE NEGATIVE mg/dL   Hgb urine dipstick MODERATE (A) NEGATIVE   Bilirubin Urine NEGATIVE NEGATIVE   Ketones, ur NEGATIVE NEGATIVE mg/dL   Protein, ur 30 (A) NEGATIVE mg/dL   Nitrite NEGATIVE NEGATIVE   Leukocytes,Ua LARGE (A) NEGATIVE   RBC / HPF 21-50 0 - 5 RBC/hpf   WBC, UA 21-50 0 - 5 WBC/hpf   Bacteria, UA RARE (A) NONE SEEN   Squamous Epithelial / LPF 0-5 0 - 5   Mucus PRESENT   Wet prep, genital     Status: Abnormal   Collection Time: 07/26/20  5:43 AM  Result Value Ref Range   Yeast Wet Prep HPF POC NONE SEEN NONE SEEN   Trich, Wet Prep NONE SEEN NONE SEEN   Clue Cells Wet Prep HPF POC NONE SEEN NONE SEEN   WBC, Wet Prep HPF POC MODERATE (A) NONE SEEN   Sperm NONE SEEN  MDM PE Labs: UA, Wet Prep,  GC/CT EFM Ultrasound-Anatomy Assessment and Plan  25 year old [id number]  SIUP at 21 weeks Cat I FT Vaginal Spotting s/p Coitus   -POC Reviewed -Exam performed and findings discussed. -Cultures collected and pending.  -Reassured that is not uncommon to have some vaginal spotting/bleeding after sex d/t increased sensitivity of the cervix. -Will await lab results and reassess. -Patient offered and declines pain medication. -Patient reports that she has not received an anatomy scan. -Informed that will order today b/c we are unsure of placental location and status which is important considering h/o abruption.  -Patient without questions. -Will await results.  -NST Reactive  [name] MSN, CNM 07/26/2020, 5:20 AM   Reassessment (6:53 AM)  -Preliminary report returns and negative for placental abruption or previa. 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 -Provider to bedside and results discussed with patient and SO. -Informed that report is preliminary and MFM MD will review and give final findings later today. -Reassured that all other findings are normal. -Discussed need to have follow up ultrasound. -Patient to follow up with primary ob as scheduled. -Instructed to abstain from sex for at least 72 hours after bleeding incident.  -Encouraged to call or return to MAU if symptoms worsen or with the onset of new symptoms. -Discharged to home in stable condition.  [name] MSN, CNM Advanced Practice Provider, [facility]

## 2020-07-26 NOTE — MAU Note (Signed)
Pt reports she started bleeding after intercourse tonight and having some slight cramping as well.  Good fetal movement felt.

## 2020-07-26 NOTE — Discharge Instructions (Signed)
Vaginal Bleeding During Pregnancy, Second Trimester A small amount of bleeding from the vagina, or spotting, is common during early pregnancy. Some bleeding may be related to the pregnancy, and some may not. In many cases, the bleeding is normal and is not a problem. However, bleeding can also be a sign of something serious. Normal things may cause bleeding during the second trimester. They include:  Rapid changes in blood vessels. This is caused by changes that are happening to the body during pregnancy.  Sex.  Pelvic exams. Abnormal things that may cause bleeding during the second trimester include:  Infection, inflammation, or growths on the cervix. The growths on the cervix are also called polyps.  A condition in which the placenta partially or completely covers the opening of the cervix (placenta previa).  The placenta separating from the uterus (placenta abruption).  Miscarriage.  Early labor, also called preterm labor.  The cervix opening and thinning before pregnancy is at term and before labor starts (cervical insufficiency).  A mass of tissue developing in the uterus due to an egg being fertilized incorrectly (molar pregnancy). Tell your health care provider about any vaginal bleeding right away. Follow these instructions at home: Monitoring your bleeding Monitor your bleeding.  Pay attention to any changes in your symptoms. Let your health care provider know about any concerns.  Try to understand when the bleeding occurs. Does the bleeding start on its own, or does it start after something is done, such as sex or a pelvic exam?  Use a diary to record the things you see about your bleeding, including: ? The kind of bleeding you are having. Does the bleeding start and stop irregularly, or is it a constant flow? ? The severity of your bleeding. Is the bleeding heavy or light? ? The number of pads you use each day, how often you change them, and how soaked they are.  Tell  your health care provider if you pass tissue. He or she may want to see it.   Activity  Follow instructions from your health care provider about limiting your activity. Ask what activities are safe for you.  Do not exercise or do activities that take a lot of effort unless your health care provider approves.  Do not have sex until your health care provider says that this is safe.  Do not lift anything that is heavier than 10 lb (4.5 kg), or the limit that you are told, until your health care provider says that it is safe.  If needed, make plans for someone to help with your regular activities. Medicines  Take over-the-counter and prescription medicines only as told by your health care provider.  Do not take aspirin because it can cause bleeding. General instructions  Do not use tampons or douche.  Keep all follow-up visits. This is important. Contact a health care provider if:  You have vaginal bleeding during any time of your pregnancy.  You have cramps or labor pains.  You have a fever that does not get better when you take medicines. Get help right away if:  You have severe cramps in your back or abdomen.  You have contractions.  You have chills.  Your bleeding increases or you pass large clots or a large amount of tissue from your vagina.  You feel light-headed or weak, or you faint.  You are leaking fluid or have a gush of fluid from your vagina. Summary  A small amount of bleeding, or spotting, from the vagina is   common during early pregnancy.  Many things can cause vaginal bleeding during pregnancy. Tell your health care provider about any bleeding right away.  Try to understand when bleeding occurs. Does bleeding occur on its own, or does it occur after something is done, such as sex or pelvic exams?  Follow instructions from your health care provider about limiting your activity. Ask what activities are safe for you.  Keep all follow-up visits. This is  important. This information is not intended to replace advice given to you by your health care provider. Make sure you discuss any questions you have with your health care provider. Document Revised: 11/02/2019 Document Reviewed: 11/02/2019 Elsevier Patient Education  2021 Elsevier Inc.  

## 2020-07-27 ENCOUNTER — Other Ambulatory Visit: Payer: Self-pay

## 2020-07-27 ENCOUNTER — Encounter (HOSPITAL_COMMUNITY): Payer: Self-pay | Admitting: Obstetrics and Gynecology

## 2020-07-27 ENCOUNTER — Inpatient Hospital Stay (HOSPITAL_COMMUNITY)
Admission: AD | Admit: 2020-07-27 | Discharge: 2020-07-27 | Disposition: A | Discharge status: Home / Self Care | Payer: Medicaid Other | Attending: Obstetrics and Gynecology | Admitting: Obstetrics and Gynecology

## 2020-07-27 DIAGNOSIS — Z79899 Other long term (current) drug therapy: Secondary | ICD-10-CM | POA: Insufficient documentation

## 2020-07-27 DIAGNOSIS — O479 False labor, unspecified: Secondary | ICD-10-CM

## 2020-07-27 DIAGNOSIS — Z3A26 26 weeks gestation of pregnancy: Secondary | ICD-10-CM | POA: Insufficient documentation

## 2020-07-27 DIAGNOSIS — O3442 Maternal care for other abnormalities of cervix, second trimester: Secondary | ICD-10-CM | POA: Diagnosis not present

## 2020-07-27 DIAGNOSIS — O4692 Antepartum hemorrhage, unspecified, second trimester: Secondary | ICD-10-CM | POA: Diagnosis present

## 2020-07-27 DIAGNOSIS — O4702 False labor before 37 completed weeks of gestation, second trimester: Secondary | ICD-10-CM | POA: Diagnosis not present

## 2020-07-27 DIAGNOSIS — N888 Other specified noninflammatory disorders of cervix uteri: Secondary | ICD-10-CM

## 2020-07-27 NOTE — MAU Note (Signed)
Was seen here yesterday for post coital bleeding.  States did not see anymore  Bleeding until around 2:15 today.  Orange/tan color spotting and noted when wiped.  Having RLQ pain, started about 20 min after seeing blood.

## 2020-07-27 NOTE — MAU Provider Note (Signed)
History     CSN: [id number]  Arrival date and time: 07/27/20 1544   Event Date/Time   First Provider Initiated Contact with Patient 07/27/20 1611      Chief Complaint  Patient presents with  . Vaginal Bleeding  . Abdominal Pain   HPI Anita Robinson is a 25 y.o. [id number] at [redacted]w[redacted]d who presents stating she is bleeding. She states she was at work and felt damp. When she looked, she states she saw an orange discharge in her underwear. She reports intermittent pain that is sometimes worse than others. She denies any leaking of fluid. Reports normal fetal movement.   OB History    Gravida  4   Para  2   Term  1   Preterm  1   AB  1   Living  2     SAB  1   IAB  0   Ectopic  0   Multiple  0   Live Births  2           Past Medical History:  Diagnosis Date  . Anemia    on Iron  . Complication of anesthesia    has never had any type of anesthesia.  . Low iron     Past Surgical History:  Procedure Laterality Date  . CESAREAN SECTION N/A 12/03/2019   Procedure: CESAREAN SECTION;  Surgeon: Steva Ready, DO;  Location: [facility];  Service: Obstetrics;  Laterality: N/A;  . NO PAST SURGERIES      Family History  Problem Relation Age of Onset  . Asthma Mother   . Asthma Sister   . Asthma Brother   . Alcohol abuse Neg Hx   . Arthritis Neg Hx   . Birth defects Neg Hx   . Cancer Neg Hx   . COPD Neg Hx   . Depression Neg Hx   . Diabetes Neg Hx   . Drug abuse Neg Hx   . Early death Neg Hx   . Hearing loss Neg Hx   . Heart disease Neg Hx   . Hyperlipidemia Neg Hx   . Hypertension Neg Hx   . Kidney disease Neg Hx   . Learning disabilities Neg Hx   . Mental illness Neg Hx   . Mental retardation Neg Hx   . Miscarriages / Stillbirths Neg Hx   . Stroke Neg Hx   . Vision loss Neg Hx   . Varicose Veins Neg Hx     Social History   Tobacco Use  . Smoking status: Never Smoker  . Smokeless tobacco: Never Used  Vaping Use  . Vaping Use: Never used   Substance Use Topics  . Alcohol use: Not Currently    Comment: social drinker  . Drug use: No    Allergies:  Allergies  Allergen Reactions  . Eggs Or Egg-Derived Products Rash    Medications Prior to Admission  Medication Sig Dispense Refill Last Dose  . acetaminophen (TYLENOL) 325 MG tablet Take 2 tablets (650 mg total) by mouth every 4 (four) hours as needed. 100 tablet 2   . iron polysaccharides (NIFEREX) 150 MG capsule Take 1 capsule (150 mg total) by mouth daily. 90 capsule 0   . magnesium oxide (MAG-OX) 400 (241.3 Mg) MG tablet Take 1 tablet (400 mg total) by mouth daily. 30 tablet 1   . Prenatal Vit-Fe Fumarate-FA (PRENATAL MULTIVITAMIN) TABS tablet Take 1 tablet by mouth daily at 12 noon.     . sertraline (  ZOLOFT) 50 MG tablet Take 1 tablet (50 mg total) by mouth daily. 30 tablet 2     Review of Systems  Constitutional: Negative.  Negative for fatigue and fever.  HENT: Negative.   Respiratory: Negative.  Negative for shortness of breath.   Cardiovascular: Negative.  Negative for chest pain.  Gastrointestinal: Positive for abdominal pain. Negative for constipation, diarrhea, nausea and vomiting.  Genitourinary: Positive for vaginal bleeding. Negative for dysuria.  Neurological: Negative.  Negative for dizziness and headaches.   Physical Exam   Blood pressure 109/65, pulse 98, temperature 98.1 F (36.7 C), temperature source Oral, resp. rate 16, height 5\' 5"  (1.651 m), weight 79 kg, last menstrual period 01/26/2020, SpO2 97 %, not currently breastfeeding.  Physical Exam Vitals and nursing note reviewed.  Constitutional:      General: She is not in acute distress.    Appearance: She is well-developed.  HENT:     Head: Normocephalic.  Eyes:     Pupils: Pupils are equal, round, and reactive to light.  Cardiovascular:     Rate and Rhythm: Normal rate and regular rhythm.     Heart sounds: Normal heart sounds.  Pulmonary:     Effort: Pulmonary effort is normal. No  respiratory distress.     Breath sounds: Normal breath sounds.  Abdominal:     General: Bowel sounds are normal. There is no distension.     Palpations: Abdomen is soft.     Tenderness: There is no abdominal tenderness.  Genitourinary:    Comments: SSE: thick white creamy discharge in vault, cervical os friable when touched with swab, cervix visually closed Skin:    General: Skin is warm and dry.  Neurological:     Mental Status: She is alert and oriented to person, place, and time.  Psychiatric:        Behavior: Behavior normal.        Thought Content: Thought content normal.        Judgment: Judgment normal.    Fetal Tracing:  Baseline: 150 Variability: moderate Accels: 10x10 Decels: none  Toco: 1 UC with UI   MAU Course  Procedures  MDM Swabs and UA deferred since it was done 24 hours ago Unable to do FFN due to recent exam  No bleeding on exam Cervix closed/thick/posterior UI noted on fetal monitoring and NST reassuing for gestational age  Low suspicison for preterm labor or abruption at this time. Warning signs of both reviewed at length. Encouraged patient to return to MAU if she sees bright red bleeding or has consistent abdominal pain. Patient verbalized understanding.  Assessment and Plan   1. Friable cervix   2. [redacted] weeks gestation of pregnancy   3. Braxton Hick's contraction    -Discharge home in stable condition -Preterm labor precautions discussed -Patient advised to follow-up with OB as scheduled for prenatal care -Patient may return to MAU as needed or if her condition were to change or worsen   14/04/2019 CNM 07/27/2020, 4:11 PM

## 2020-07-27 NOTE — Discharge Instructions (Signed)
Safe Medications in Pregnancy   Acne: Benzoyl Peroxide Salicylic Acid  Backache/Headache: Tylenol: 2 regular strength every 4 hours OR              2 Extra strength every 6 hours  Colds/Coughs/Allergies: Benadryl (alcohol free) 25 mg every 6 hours as needed Breath right strips Claritin Cepacol throat lozenges Chloraseptic throat spray Cold-Eeze- up to three times per day Cough drops, alcohol free Flonase (by prescription only) Guaifenesin Mucinex Robitussin DM (plain only, alcohol free) Saline nasal spray/drops Sudafed (pseudoephedrine) & Actifed ** use only after [redacted] weeks gestation and if you do not have high blood pressure Tylenol Vicks Vaporub Zinc lozenges Zyrtec   Constipation: Colace Ducolax suppositories Fleet enema Glycerin suppositories Metamucil Milk of magnesia Miralax Senokot Smooth move tea  Diarrhea: Kaopectate Imodium A-D  *NO pepto Bismol  Hemorrhoids: Anusol Anusol HC Preparation H Tucks  Indigestion: Tums Maalox Mylanta Zantac  Pepcid  Insomnia: Benadryl (alcohol free) 25mg every 6 hours as needed Tylenol PM Unisom, no Gelcaps  Leg Cramps: Tums MagGel  Nausea/Vomiting:  Bonine Dramamine Emetrol Ginger extract Sea bands Meclizine  Nausea medication to take during pregnancy:  Unisom (doxylamine succinate 25 mg tablets) Take one tablet daily at bedtime. If symptoms are not adequately controlled, the dose can be increased to a maximum recommended dose of two tablets daily (1/2 tablet in the morning, 1/2 tablet mid-afternoon and one at bedtime). Vitamin B6 100mg tablets. Take one tablet twice a day (up to 200 mg per day).  Skin Rashes: Aveeno products Benadryl cream or 25mg every 6 hours as needed Calamine Lotion 1% cortisone cream  Yeast infection: Gyne-lotrimin 7 Monistat 7   **If taking multiple medications, please check labels to avoid duplicating the same active ingredients **take medication as directed on  the label ** Do not exceed 4000 mg of tylenol in 24 hours **Do not take medications that contain aspirin or ibuprofen     Rosen's Emergency Medicine: Concepts and Clinical Practice (9th ed., pp. 2296- 2312). Elsevier.">  Braxton Hicks Contractions Contractions of the uterus can occur throughout pregnancy, but they are not always a sign that you are in labor. You may have practice contractions called Braxton Hicks contractions. These false labor contractions are sometimes confused with true labor. What are Braxton Hicks contractions? Braxton Hicks contractions are tightening movements that occur in the muscles of the uterus before labor. Unlike true labor contractions, these contractions do not result in opening (dilation) and thinning of the cervix. Toward the end of pregnancy (32-34 weeks), Braxton Hicks contractions can happen more often and may become stronger. These contractions are sometimes difficult to tell apart from true labor because they can be very uncomfortable. You should not feel embarrassed if you go to the hospital with false labor. Sometimes, the only way to tell if you are in true labor is for your health care provider to look for changes in the cervix. The health care provider will do a physical exam and may monitor your contractions. If you are not in true labor, the exam should show that your cervix is not dilating and your water has not broken. If there are no other health problems associated with your pregnancy, it is completely safe for you to be sent home with false labor. You may continue to have Braxton Hicks contractions until you go into true labor. How to tell the difference between true labor and false labor True labor  Contractions last 30-70 seconds.  Contractions become very regular.  Discomfort   is usually felt in the top of the uterus, and it spreads to the lower abdomen and low back.  Contractions do not go away with walking.  Contractions usually become  more intense and increase in frequency.  The cervix dilates and gets thinner. False labor  Contractions are usually shorter and not as strong as true labor contractions.  Contractions are usually irregular.  Contractions are often felt in the front of the lower abdomen and in the groin.  Contractions may go away when you walk around or change positions while lying down.  Contractions get weaker and are shorter-lasting as time goes on.  The cervix usually does not dilate or become thin. Follow these instructions at home:  Take over-the-counter and prescription medicines only as told by your health care provider.  Keep up with your usual exercises and follow other instructions from your health care provider.  Eat and drink lightly if you think you are going into labor.  If Braxton Hicks contractions are making you uncomfortable: ? Change your position from lying down or resting to walking, or change from walking to resting. ? Sit and rest in a tub of warm water. ? Drink enough fluid to keep your urine pale yellow. Dehydration may cause these contractions. ? Do slow and deep breathing several times an hour.  Keep all follow-up prenatal visits as told by your health care provider. This is important.   Contact a health care provider if:  You have a fever.  You have continuous pain in your abdomen. Get help right away if:  Your contractions become stronger, more regular, and closer together.  You have fluid leaking or gushing from your vagina.  You pass blood-tinged mucus (bloody show).  You have bleeding from your vagina.  You have low back pain that you never had before.  You feel your baby's head pushing down and causing pelvic pressure.  Your baby is not moving inside you as much as it used to. Summary  Contractions that occur before labor are called Braxton Hicks contractions, false labor, or practice contractions.  Braxton Hicks contractions are usually shorter,  weaker, farther apart, and less regular than true labor contractions. True labor contractions usually become progressively stronger and regular, and they become more frequent.  Manage discomfort from [facility] contractions by changing position, resting in a warm bath, drinking plenty of water, or practicing deep breathing. This information is not intended to replace advice given to you by your health care provider. Make sure you discuss any questions you have with your health care provider. Document Revised: 01/22/2017 Document Reviewed: 06/25/2016 Elsevier Patient Education  [place].

## 2020-07-29 ENCOUNTER — Other Ambulatory Visit: Payer: Self-pay | Admitting: *Deleted

## 2020-07-29 DIAGNOSIS — Z362 Encounter for other antenatal screening follow-up: Secondary | ICD-10-CM

## 2020-08-01 DIAGNOSIS — Z3A27 27 weeks gestation of pregnancy: Secondary | ICD-10-CM | POA: Diagnosis not present

## 2020-08-01 DIAGNOSIS — O4692 Antepartum hemorrhage, unspecified, second trimester: Secondary | ICD-10-CM | POA: Diagnosis not present

## 2020-08-01 DIAGNOSIS — O359XX0 Maternal care for (suspected) fetal abnormality and damage, unspecified, not applicable or unspecified: Secondary | ICD-10-CM | POA: Diagnosis not present

## 2020-08-14 DIAGNOSIS — Z3493 Encounter for supervision of normal pregnancy, unspecified, third trimester: Secondary | ICD-10-CM | POA: Diagnosis not present

## 2020-08-19 ENCOUNTER — Ambulatory Visit: Payer: Medicaid Other

## 2020-08-20 ENCOUNTER — Encounter: Payer: Self-pay | Admitting: *Deleted

## 2020-08-20 ENCOUNTER — Other Ambulatory Visit: Payer: Self-pay | Admitting: *Deleted

## 2020-08-20 ENCOUNTER — Other Ambulatory Visit: Payer: Self-pay

## 2020-08-20 ENCOUNTER — Ambulatory Visit: Payer: Medicaid Other | Admitting: *Deleted

## 2020-08-20 ENCOUNTER — Ambulatory Visit: Payer: Medicaid Other | Attending: Obstetrics | Admitting: Obstetrics

## 2020-08-20 ENCOUNTER — Ambulatory Visit: Payer: Medicaid Other | Attending: Obstetrics and Gynecology

## 2020-08-20 VITALS — BP 103/55 | HR 95

## 2020-08-20 DIAGNOSIS — Z362 Encounter for other antenatal screening follow-up: Secondary | ICD-10-CM | POA: Insufficient documentation

## 2020-08-20 DIAGNOSIS — O283 Abnormal ultrasonic finding on antenatal screening of mother: Secondary | ICD-10-CM

## 2020-08-20 DIAGNOSIS — O358XX0 Maternal care for other (suspected) fetal abnormality and damage, not applicable or unspecified: Secondary | ICD-10-CM | POA: Diagnosis not present

## 2020-08-20 DIAGNOSIS — Z3A30 30 weeks gestation of pregnancy: Secondary | ICD-10-CM | POA: Diagnosis not present

## 2020-08-20 DIAGNOSIS — O35EXX0 Maternal care for other (suspected) fetal abnormality and damage, fetal genitourinary anomalies, not applicable or unspecified: Secondary | ICD-10-CM

## 2020-08-20 DIAGNOSIS — O09213 Supervision of pregnancy with history of pre-term labor, third trimester: Secondary | ICD-10-CM

## 2020-08-20 DIAGNOSIS — O9902 Anemia complicating childbirth: Secondary | ICD-10-CM

## 2020-08-20 DIAGNOSIS — Z3A29 29 weeks gestation of pregnancy: Secondary | ICD-10-CM

## 2020-08-20 DIAGNOSIS — O34219 Maternal care for unspecified type scar from previous cesarean delivery: Secondary | ICD-10-CM | POA: Diagnosis not present

## 2020-08-20 DIAGNOSIS — O359XX0 Maternal care for (suspected) fetal abnormality and damage, unspecified, not applicable or unspecified: Secondary | ICD-10-CM

## 2020-08-20 NOTE — Progress Notes (Signed)
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  She reports that her vaginal bleeding has resolved.  She denies any significant past medical history and denies any problems in her current pregnancy.    She had a cell free DNA test earlier in her pregnancy which indicated a low risk for trisomy 75, 23, and 13. A female fetus is predicted.   She was informed that the fetal growth and amniotic fluid level were appropriate for her gestational age.   On today's exam, an absent left fetal kidney was noted.  There did not appear to be a left renal artery branching off of the aorta. The right fetal kidney was present. A normal-appearing filled fetal bladder along with normal amniotic fluid were noted today indicating that the single kidney is functioning normally.  She was advised that her baby will require further imaging studies after birth to determine if the left kidney is truly absent. The patient was reassured that a person just needs one functioning kidney to live a normal life.  We will continue to follow her to assess this finding.   The patient was informed that anomalies may be missed due to technical limitations. If the fetus is in a suboptimal position or maternal habitus is increased, visualization of the fetus in the maternal uterus may be impaired.  A follow-up exam was scheduled in 4 weeks to assess the fetal growth.    A total of 20 minutes was spent counseling and coordinating the care for this patient.  Greater than 50% of the time was spent in direct face-to-face contact.

## 2020-08-23 DIAGNOSIS — Z419 Encounter for procedure for purposes other than remedying health state, unspecified: Secondary | ICD-10-CM | POA: Diagnosis not present

## 2020-08-29 ENCOUNTER — Other Ambulatory Visit: Payer: Self-pay

## 2020-09-04 ENCOUNTER — Other Ambulatory Visit: Payer: Self-pay

## 2020-09-09 ENCOUNTER — Other Ambulatory Visit: Payer: Self-pay | Admitting: Obstetrics and Gynecology

## 2020-09-09 DIAGNOSIS — L299 Pruritus, unspecified: Secondary | ICD-10-CM | POA: Diagnosis not present

## 2020-09-09 DIAGNOSIS — N390 Urinary tract infection, site not specified: Secondary | ICD-10-CM | POA: Diagnosis not present

## 2020-09-17 ENCOUNTER — Ambulatory Visit: Payer: Medicaid Other

## 2020-09-23 DIAGNOSIS — Z419 Encounter for procedure for purposes other than remedying health state, unspecified: Secondary | ICD-10-CM | POA: Diagnosis not present

## 2020-09-25 ENCOUNTER — Ambulatory Visit: Payer: Medicaid Other | Attending: Obstetrics

## 2020-09-25 ENCOUNTER — Ambulatory Visit: Payer: Medicaid Other

## 2020-09-28 ENCOUNTER — Inpatient Hospital Stay (HOSPITAL_COMMUNITY)
Admission: AD | Admit: 2020-09-28 | Discharge: 2020-09-28 | Disposition: A | Discharge status: Home / Self Care | Payer: Medicaid Other | Attending: Obstetrics & Gynecology | Admitting: Obstetrics & Gynecology

## 2020-09-28 ENCOUNTER — Encounter (HOSPITAL_COMMUNITY): Payer: Self-pay | Admitting: Obstetrics & Gynecology

## 2020-09-28 ENCOUNTER — Other Ambulatory Visit: Payer: Self-pay

## 2020-09-28 DIAGNOSIS — O99891 Other specified diseases and conditions complicating pregnancy: Secondary | ICD-10-CM | POA: Diagnosis not present

## 2020-09-28 DIAGNOSIS — N760 Acute vaginitis: Secondary | ICD-10-CM

## 2020-09-28 DIAGNOSIS — Z3A35 35 weeks gestation of pregnancy: Secondary | ICD-10-CM | POA: Diagnosis not present

## 2020-09-28 DIAGNOSIS — O4703 False labor before 37 completed weeks of gestation, third trimester: Secondary | ICD-10-CM | POA: Diagnosis not present

## 2020-09-28 DIAGNOSIS — O23593 Infection of other part of genital tract in pregnancy, third trimester: Secondary | ICD-10-CM | POA: Insufficient documentation

## 2020-09-28 DIAGNOSIS — N898 Other specified noninflammatory disorders of vagina: Secondary | ICD-10-CM | POA: Diagnosis present

## 2020-09-28 DIAGNOSIS — B9689 Other specified bacterial agents as the cause of diseases classified elsewhere: Secondary | ICD-10-CM | POA: Diagnosis not present

## 2020-09-28 DIAGNOSIS — O479 False labor, unspecified: Secondary | ICD-10-CM

## 2020-09-28 LAB — URINALYSIS, ROUTINE W REFLEX MICROSCOPIC
Bilirubin Urine: NEGATIVE
Glucose, UA: NEGATIVE mg/dL
Hgb urine dipstick: NEGATIVE
Ketones, ur: NEGATIVE mg/dL
Nitrite: NEGATIVE
Protein, ur: NEGATIVE mg/dL
Specific Gravity, Urine: 1.012 (ref 1.005–1.030)
pH: 7 (ref 5.0–8.0)

## 2020-09-28 LAB — WET PREP, GENITAL
Sperm: NONE SEEN
Trich, Wet Prep: NONE SEEN
Yeast Wet Prep HPF POC: NONE SEEN

## 2020-09-28 MED ORDER — METRONIDAZOLE 500 MG PO TABS: 500.0000 mg | ORAL_TABLET | Freq: Two times a day (BID) | ORAL | 0 refills | Status: DC

## 2020-09-28 NOTE — MAU Note (Signed)
..  Anita Robinson is a 25 y.o. at [redacted]w[redacted]d here in MAU reporting: Braxton hicks that are 5-6 times in an hour and occasionally are so painful she has to breathe through them. As well as vaginal discharge that is has a foul odor and milky white. +FM. Denies vaginal bleeding.  Pain score: 8/10 Lab orders placed from triage: UA

## 2020-09-28 NOTE — MAU Provider Note (Signed)
Patient Anita Robinson is a 25 y.o. [id number] At [redacted]w[redacted]d here with complaints of contractions and vaginal discharge. She denies vaginal bleeding, no SOB, no fever. She denies decreased fetal movements, LOF. She had an NSVD in 2015 and then an emergency c/section in 2021, suspected abruption. She has a scheduled repeat C/S on 10/21/2020. She denies any current or past history of blood sugar issues, high blood pressure. She has not history of pre-term delivery. She has had 3 contractions in the past hour. She came in because she just wanted to make sure everything is ok and because she is concerned about her vaginal discharge.  History     CSN: [id number]  Arrival date and time: 09/28/20 2001   Event Date/Time   First Provider Initiated Contact with Patient 09/28/20 2111      Chief Complaint  Patient presents with   Contractions   Vaginal Discharge   Vaginal Discharge The patient's primary symptoms include vaginal discharge. The current episode started in the past 7 days. The problem occurs 2 to 4 times per day. Associated symptoms include abdominal pain. Pertinent negatives include no diarrhea, dysuria, fever, nausea or vomiting. The vaginal discharge was white and thick. There has been no bleeding. She has not been passing clots. She has not been passing tissue. Her past medical history is significant for a Cesarean section.  Abdominal Pain This is a new problem. The current episode started in the past 7 days. Pain location: sometimes underneath belly button and sometimes on top of belly button. The pain is at a severity of 8/10. The quality of the pain is cramping. The abdominal pain does not radiate. Pertinent negatives include no diarrhea, dysuria, fever, nausea or vomiting.  The pain started about three weeks ago but got stronger over the past three days. She states "I do not think I am in labor; I think I have BV" OB History     Gravida  4   Para  2   Term  1   Preterm  1   AB  1    Living  2      SAB  1   IAB  0   Ectopic  0   Multiple  0   Live Births  2           Past Medical History:  Diagnosis Date   Anemia    on Iron   Complication of anesthesia    has never had any type of anesthesia.   Low iron     Past Surgical History:  Procedure Laterality Date   CESAREAN SECTION N/A 12/03/2019   Procedure: CESAREAN SECTION;  Surgeon: Steva Ready, DO;  Location: [facility];  Service: Obstetrics;  Laterality: N/A;   NO PAST SURGERIES      Family History  Problem Relation Age of Onset   Asthma Mother    Asthma Sister    Asthma Brother    Alcohol abuse Neg Hx    Arthritis Neg Hx    Birth defects Neg Hx    Cancer Neg Hx    COPD Neg Hx    Depression Neg Hx    Diabetes Neg Hx    Drug abuse Neg Hx    Early death Neg Hx    Hearing loss Neg Hx    Heart disease Neg Hx    Hyperlipidemia Neg Hx    Hypertension Neg Hx    Kidney disease Neg Hx    Learning disabilities Neg Hx  Mental illness Neg Hx    Mental retardation Neg Hx    Miscarriages / Stillbirths Neg Hx    Stroke Neg Hx    Vision loss Neg Hx    Varicose Veins Neg Hx     Social History   Tobacco Use   Smoking status: Never   Smokeless tobacco: Never  Vaping Use   Vaping Use: Never used  Substance Use Topics   Alcohol use: Not Currently    Comment: social drinker   Drug use: No    Allergies: No Known Allergies  Medications Prior to Admission  Medication Sig Dispense Refill Last Dose   iron polysaccharides (NIFEREX) 150 MG capsule Take 1 capsule (150 mg total) by mouth daily. 90 capsule 0 Past Week   Prenatal Vit-Fe Fumarate-FA (PRENATAL MULTIVITAMIN) TABS tablet Take 1 tablet by mouth daily at 12 noon.   Past Week   magnesium oxide (MAG-OX) 400 (241.3 Mg) MG tablet Take 1 tablet (400 mg total) by mouth daily. (Patient not taking: Reported on 09/28/2020) 30 tablet 1 Not Taking   sertraline (ZOLOFT) 50 MG tablet Take 1 tablet (50 mg total) by mouth daily. (Patient not  taking: Reported on 09/28/2020) 30 tablet 2 Not Taking    Review of Systems  Constitutional: Negative.  Negative for fever.  Gastrointestinal:  Positive for abdominal pain. Negative for diarrhea, nausea and vomiting.  Endocrine: Negative.   Genitourinary:  Positive for vaginal discharge. Negative for dysuria.  Musculoskeletal: Negative.   Skin: Negative.   Neurological: Negative.   Psychiatric/Behavioral: Negative.    Physical Exam   Blood pressure 113/69, pulse (!) 106, temperature 99.5 F (37.5 C), temperature source Oral, resp. rate 18, height 5\' 5"  (1.651 m), weight 85 kg, last menstrual period 01/26/2020, not currently breastfeeding.  Physical Exam Constitutional:      Appearance: Normal appearance.  Cardiovascular:     Rate and Rhythm: Normal rate.  Pulmonary:     Effort: Pulmonary effort is normal.  Abdominal:     General: Abdomen is flat.  Musculoskeletal:        General: Normal range of motion.  Skin:    General: Skin is warm and dry.  Neurological:     General: No focal deficit present.     Mental Status: She is alert.  Cervix is 3cm/60/-3  MAU Course  Procedures  MDM -NST 150 bpm, mod var, present acel, no decels, uterine irratability and occasional contractions. She reports that she has about 3 ctx in 1 hour; denies that they are stronger or getting closer together.  -Cervix rechecked after 90 minutes; cervix is unchanged and patient desires discharge -swabs shows BV -UA shows leuks but no nitrites or Hgb   Assessment and Plan   1. Bacterial vaginosis   2. Braxton Hick's contraction    -patient to keep upcoming appt at [facility] -reviewed strict return precautions, especially given that patient has history of c/section. She should return to MAU if she feels like contractions are increasing or getting stronger/closer together.  -RX for metronidazole given -All questions answered  -UA shows leuks, will send for culture -GC CT pending  14/04/2019 09/28/2020, 9:25 PM

## 2020-09-30 ENCOUNTER — Other Ambulatory Visit: Payer: Self-pay

## 2020-09-30 ENCOUNTER — Inpatient Hospital Stay (HOSPITAL_COMMUNITY)
Admission: AD | Admit: 2020-09-30 | Discharge: 2020-09-30 | Disposition: A | Discharge status: Home / Self Care | Payer: Medicaid Other | Attending: Obstetrics and Gynecology | Admitting: Obstetrics and Gynecology

## 2020-09-30 ENCOUNTER — Encounter (HOSPITAL_COMMUNITY): Payer: Self-pay | Admitting: Obstetrics and Gynecology

## 2020-09-30 DIAGNOSIS — O4703 False labor before 37 completed weeks of gestation, third trimester: Secondary | ICD-10-CM | POA: Insufficient documentation

## 2020-09-30 DIAGNOSIS — Z3A35 35 weeks gestation of pregnancy: Secondary | ICD-10-CM | POA: Insufficient documentation

## 2020-09-30 DIAGNOSIS — O26893 Other specified pregnancy related conditions, third trimester: Secondary | ICD-10-CM

## 2020-09-30 DIAGNOSIS — Z79899 Other long term (current) drug therapy: Secondary | ICD-10-CM | POA: Diagnosis not present

## 2020-09-30 DIAGNOSIS — N898 Other specified noninflammatory disorders of vagina: Secondary | ICD-10-CM | POA: Diagnosis not present

## 2020-09-30 LAB — URINALYSIS, ROUTINE W REFLEX MICROSCOPIC
Bilirubin Urine: NEGATIVE
Glucose, UA: NEGATIVE mg/dL
Hgb urine dipstick: NEGATIVE
Ketones, ur: NEGATIVE mg/dL
Nitrite: NEGATIVE
Protein, ur: NEGATIVE mg/dL
Specific Gravity, Urine: 1.014 (ref 1.005–1.030)
pH: 7 (ref 5.0–8.0)

## 2020-09-30 LAB — POCT FERN TEST: POCT Fern Test: NEGATIVE

## 2020-09-30 LAB — GC/CHLAMYDIA PROBE AMP (~~LOC~~) NOT AT ARMC
Chlamydia: NEGATIVE
Comment: NEGATIVE
Comment: NORMAL
Neisseria Gonorrhea: NEGATIVE

## 2020-09-30 NOTE — MAU Provider Note (Signed)
Chief Complaint:  Rupture of Membranes   None    HPI: Anita Robinson is a 25 y.o. [id number] at [redacted]w[redacted]d who presents to maternity admissions reporting leaking fluid. Patient reports at 2pm she had 2 gushes of cream colored fluid. Did not have to wear a pad and has not had any more gushes since. She was seen 2 days ago and diagnosed with BV for which she is taking medication for. She denies vaginal bleeding. Reports some braxton hicks contractions, approximately 4 in 1 hour. Endorses active fetal movement.   Pregnancy Course:   Past Medical History:  Diagnosis Date   Anemia    on Iron   Low iron    OB History  Gravida Para Term Preterm AB Living  4 2 1 1 1 2   SAB IAB Ectopic Multiple Live Births  1 0 0 0 2    # Outcome Date GA Lbr Len/2nd Weight Sex Delivery Anes PTL Lv  4 Current           3 Preterm 12/03/19 [redacted]w[redacted]d  1020 g F CS-LTranv Gen  LIV  2 Term 12/23/13 [redacted]w[redacted]d -16:59 / 01:15 3189 g M Vag-Spont EPI  LIV  1 SAB  [redacted]w[redacted]d          Past Surgical History:  Procedure Laterality Date   CESAREAN SECTION N/A 12/03/2019   Procedure: CESAREAN SECTION;  Surgeon: 02/02/2020, DO;  Location: [facility];  Service: Obstetrics;  Laterality: N/A;   NO PAST SURGERIES     Family History  Problem Relation Age of Onset   Asthma Mother    Asthma Sister    Asthma Brother    Alcohol abuse Neg Hx    Arthritis Neg Hx    Birth defects Neg Hx    Cancer Neg Hx    COPD Neg Hx    Depression Neg Hx    Diabetes Neg Hx    Drug abuse Neg Hx    Early death Neg Hx    Hearing loss Neg Hx    Heart disease Neg Hx    Hyperlipidemia Neg Hx    Hypertension Neg Hx    Kidney disease Neg Hx    Learning disabilities Neg Hx    Mental illness Neg Hx    Mental retardation Neg Hx    Miscarriages / Stillbirths Neg Hx    Stroke Neg Hx    Vision loss Neg Hx    Varicose Veins Neg Hx    Social History   Tobacco Use   Smoking status: Never   Smokeless tobacco: Never  Vaping Use   Vaping Use: Never used   Substance Use Topics   Alcohol use: Not Currently    Comment: social drinker   Drug use: No   No Known Allergies Medications Prior to Admission  Medication Sig Dispense Refill Last Dose   iron polysaccharides (NIFEREX) 150 MG capsule Take 1 capsule (150 mg total) by mouth daily. 90 capsule 0 09/30/2020   metroNIDAZOLE (FLAGYL) 500 MG tablet Take 1 tablet (500 mg total) by mouth 2 (two) times daily. 14 tablet 0 09/30/2020   Prenatal Vit-Fe Fumarate-FA (PRENATAL MULTIVITAMIN) TABS tablet Take 1 tablet by mouth daily at 12 noon.   09/30/2020   magnesium oxide (MAG-OX) 400 (241.3 Mg) MG tablet Take 1 tablet (400 mg total) by mouth daily. (Patient not taking: Reported on 09/28/2020) 30 tablet 1    sertraline (ZOLOFT) 50 MG tablet Take 1 tablet (50 mg total) by mouth daily. (Patient  not taking: Reported on 09/28/2020) 30 tablet 2     I have reviewed patient's Past Medical Hx, Surgical Hx, Family Hx, Social Hx, medications and allergies.   ROS:  Review of Systems  Constitutional: Negative.   Respiratory: Negative.    Cardiovascular: Negative.   Gastrointestinal: Negative.   Genitourinary:  Positive for vaginal discharge. Negative for vaginal bleeding.  Musculoskeletal: Negative.   Neurological: Negative.    Physical Exam  Patient Vitals for the past 24 hrs:  BP Temp Pulse Resp Height Weight  09/30/20 1641 109/63 98.6 F (37 C) (!) 101 18 5\' 5"  (1.651 m) 83 kg   Constitutional: well-developed, well-nourished female in no acute distress.  Cardiovascular: normal rate Respiratory: normal effort GI: abd soft, non-tender, gravid  MS: extremities nontender, no edema, normal ROM Neurologic: alert and oriented x 4.  GU: neg CVAT. Pelvic: NEFG, thin cream colored discharge, no pooling of amniotic fluid, no blood, cervix clean without masses/lesions, no CMT  Dilation: 3 Effacement (%): 50 Station: Ballotable Presentation: Undeterminable Exam by:: [email or web address], CNM  FHT: Baseline 140 bpm,  moderate variability, 15x15 accelerations present, no decelerations Toco: occasional   Labs: Results for orders placed or performed during the hospital encounter of 09/30/20 (from the past 24 hour(s))  Fern Test     Status: Normal   Collection Time: 09/30/20  5:30 PM  Result Value Ref Range   POCT Fern Test Negative = intact amniotic membranes     Imaging:  No results found.  MAU Course: Orders Placed This Encounter  Procedures   Urinalysis, Routine w reflex microscopic Urine, Clean Catch   Fern Test   Discharge patient   No orders of the defined types were placed in this encounter.   MDM: Sterile speculum exam without pooling of amniotic fluid, negative fern, small amount of cream-colored discharge. Patient currently taking metronidazole for BV. I suspect the gush patient had earlier likely related to BV NST reactive Toco with occasional contraction Cervix unchanged from previous exam   Assessment: 1. [redacted] weeks gestation of pregnancy   2. Vaginal discharge during pregnancy in third trimester     Plan: Discharge home in stable condition  Labor precautions and fetal kick counts Patient to keep appointment as scheduled  Return to MAU as needed    Follow-up Information     [facility] Follow up.   Specialty: Obstetrics and Gynecology Why: as scheduled. Return to MAU as needed Contact information: [address] [employer] [place] [phone]                Allergies as of 09/30/2020   No Known Allergies      Medication List     TAKE these medications    iron polysaccharides 150 MG capsule Commonly known as: NIFEREX Take 1 capsule (150 mg total) by mouth daily.   metroNIDAZOLE 500 MG tablet Commonly known as: FLAGYL Take 1 tablet (500 mg total) by mouth 2 (two) times daily.   prenatal multivitamin Tabs tablet Take 1 tablet by mouth daily at 12 noon.       ASK your doctor about  these medications    magnesium oxide 400 (241.3 Mg) MG tablet Commonly known as: MAG-OX Take 1 tablet (400 mg total) by mouth daily.   sertraline 50 MG tablet Commonly known as: ZOLOFT Take 1 tablet (50 mg total) by mouth daily.         11/30/2020, MSN, CNM 09/30/2020 5:42 PM

## 2020-09-30 NOTE — MAU Note (Signed)
Pt reprots she had a gush of fluid around 2pm today but no other leaking since. C/o mild cramping on and off as well. Good fetal movement felt. no vag bleeding reported.

## 2020-10-01 LAB — CULTURE, OB URINE: Culture: 80000 — AB

## 2020-10-03 ENCOUNTER — Encounter: Payer: Self-pay | Admitting: Family Medicine

## 2020-10-03 ENCOUNTER — Other Ambulatory Visit: Payer: Self-pay | Admitting: Family Medicine

## 2020-10-03 DIAGNOSIS — R8271 Bacteriuria: Secondary | ICD-10-CM | POA: Insufficient documentation

## 2020-10-03 DIAGNOSIS — O2343 Unspecified infection of urinary tract in pregnancy, third trimester: Secondary | ICD-10-CM

## 2020-10-03 MED ORDER — CEFADROXIL 500 MG PO CAPS: 500.0000 mg | ORAL_CAPSULE | Freq: Two times a day (BID) | ORAL | 0 refills | Status: DC

## 2020-10-03 NOTE — Progress Notes (Signed)
UTI treatment, see mychart note

## 2020-10-07 ENCOUNTER — Other Ambulatory Visit: Payer: Self-pay

## 2020-10-07 ENCOUNTER — Inpatient Hospital Stay (HOSPITAL_COMMUNITY)
Admission: AD | Admit: 2020-10-07 | Discharge: 2020-10-09 | DRG: 807 | Disposition: A | Discharge status: Home / Self Care | Payer: Medicaid Other | Attending: Obstetrics and Gynecology | Admitting: Obstetrics and Gynecology

## 2020-10-07 ENCOUNTER — Inpatient Hospital Stay (HOSPITAL_COMMUNITY): Payer: Medicaid Other | Admitting: Anesthesiology

## 2020-10-07 ENCOUNTER — Encounter (HOSPITAL_COMMUNITY): Payer: Self-pay | Admitting: Obstetrics and Gynecology

## 2020-10-07 DIAGNOSIS — O323XX9 Maternal care for face, brow and chin presentation, other fetus: Secondary | ICD-10-CM | POA: Diagnosis not present

## 2020-10-07 DIAGNOSIS — O358XX0 Maternal care for other (suspected) fetal abnormality and damage, not applicable or unspecified: Secondary | ICD-10-CM | POA: Diagnosis not present

## 2020-10-07 DIAGNOSIS — Z20822 Contact with and (suspected) exposure to covid-19: Secondary | ICD-10-CM | POA: Diagnosis present

## 2020-10-07 DIAGNOSIS — D649 Anemia, unspecified: Secondary | ICD-10-CM | POA: Diagnosis not present

## 2020-10-07 DIAGNOSIS — Z3A36 36 weeks gestation of pregnancy: Secondary | ICD-10-CM

## 2020-10-07 DIAGNOSIS — O34219 Maternal care for unspecified type scar from previous cesarean delivery: Secondary | ICD-10-CM | POA: Diagnosis not present

## 2020-10-07 DIAGNOSIS — Z3490 Encounter for supervision of normal pregnancy, unspecified, unspecified trimester: Secondary | ICD-10-CM

## 2020-10-07 DIAGNOSIS — O9902 Anemia complicating childbirth: Secondary | ICD-10-CM | POA: Diagnosis present

## 2020-10-07 DIAGNOSIS — D509 Iron deficiency anemia, unspecified: Secondary | ICD-10-CM | POA: Diagnosis not present

## 2020-10-07 LAB — CBC
HCT: 30 % — ABNORMAL LOW (ref 36.0–46.0)
Hemoglobin: 8.7 g/dL — ABNORMAL LOW (ref 12.0–15.0)
MCH: 21.9 pg — ABNORMAL LOW (ref 26.0–34.0)
MCHC: 29 g/dL — ABNORMAL LOW (ref 30.0–36.0)
MCV: 75.6 fL — ABNORMAL LOW (ref 80.0–100.0)
Platelets: 345 10*3/uL (ref 150–400)
RBC: 3.97 MIL/uL (ref 3.87–5.11)
RDW: 15.6 % — ABNORMAL HIGH (ref 11.5–15.5)
WBC: 9.1 10*3/uL (ref 4.0–10.5)
nRBC: 0.2 % (ref 0.0–0.2)

## 2020-10-07 LAB — RESP PANEL BY RT-PCR (FLU A&B, COVID) ARPGX2
Influenza A by PCR: NEGATIVE
Influenza B by PCR: NEGATIVE
SARS Coronavirus 2 by RT PCR: NEGATIVE

## 2020-10-07 LAB — TYPE AND SCREEN
ABO/RH(D): O POS
Antibody Screen: NEGATIVE

## 2020-10-07 LAB — RPR: RPR Ser Ql: NONREACTIVE

## 2020-10-07 LAB — GROUP B STREP BY PCR: Group B strep by PCR: NEGATIVE

## 2020-10-07 MED ORDER — DIPHENHYDRAMINE HCL 25 MG PO CAPS: 25.0000 mg | ORAL_CAPSULE | Freq: Four times a day (QID) | ORAL | Status: DC | PRN

## 2020-10-07 MED ORDER — SIMETHICONE 80 MG PO CHEW: 80.0000 mg | CHEWABLE_TABLET | ORAL | Status: DC | PRN

## 2020-10-07 MED ORDER — DIBUCAINE (PERIANAL) 1 % EX OINT: 1.0000 "application " | TOPICAL_OINTMENT | CUTANEOUS | Status: DC | PRN

## 2020-10-07 MED ORDER — OXYTOCIN-SODIUM CHLORIDE 30-0.9 UT/500ML-% IV SOLN
2.5000 [IU]/h | INTRAVENOUS | Status: DC
  Filled 2020-10-07: qty 500

## 2020-10-07 MED ORDER — OXYCODONE-ACETAMINOPHEN 5-325 MG PO TABS
1.0000 | ORAL_TABLET | ORAL | Status: DC | PRN
Start: 2020-10-07 — End: 2020-10-07

## 2020-10-07 MED ORDER — LACTATED RINGERS IV SOLN: 500.0000 mL | INTRAVENOUS | Status: DC | PRN

## 2020-10-07 MED ORDER — OXYCODONE-ACETAMINOPHEN 5-325 MG PO TABS: 2.0000 | ORAL_TABLET | ORAL | Status: DC | PRN

## 2020-10-07 MED ORDER — SODIUM CHLORIDE 0.9 % IV SOLN
2.0000 g | Freq: Four times a day (QID) | INTRAVENOUS | Status: DC
  Administered 2020-10-07: 2 g via INTRAVENOUS
  Filled 2020-10-07: qty 2000

## 2020-10-07 MED ORDER — OXYTOCIN BOLUS FROM INFUSION: 333.0000 mL | Freq: Once | INTRAVENOUS | Status: DC

## 2020-10-07 MED ORDER — FENTANYL-BUPIVACAINE-NACL 0.5-0.125-0.9 MG/250ML-% EP SOLN
12.0000 mL/h | EPIDURAL | Status: DC | PRN
Start: 2020-10-07 — End: 2020-10-07
  Administered 2020-10-07: 12 mL/h via EPIDURAL

## 2020-10-07 MED ORDER — ACETAMINOPHEN 325 MG PO TABS
650.0000 mg | ORAL_TABLET | ORAL | Status: DC | PRN
  Administered 2020-10-07 – 2020-10-08 (×5): 650 mg via ORAL
  Filled 2020-10-07 (×5): qty 2

## 2020-10-07 MED ORDER — IBUPROFEN 600 MG PO TABS
600.0000 mg | ORAL_TABLET | Freq: Four times a day (QID) | ORAL | Status: DC
  Administered 2020-10-07 – 2020-10-09 (×8): 600 mg via ORAL
  Filled 2020-10-07 (×8): qty 1

## 2020-10-07 MED ORDER — FENTANYL-BUPIVACAINE-NACL 0.5-0.125-0.9 MG/250ML-% EP SOLN
EPIDURAL | Status: AC
  Filled 2020-10-07: qty 250

## 2020-10-07 MED ORDER — COCONUT OIL OIL: 1.0000 "application " | TOPICAL_OIL | Status: DC | PRN

## 2020-10-07 MED ORDER — ONDANSETRON HCL 4 MG/2ML IJ SOLN: 4.0000 mg | Freq: Four times a day (QID) | INTRAMUSCULAR | Status: DC | PRN

## 2020-10-07 MED ORDER — ONDANSETRON HCL 4 MG PO TABS: 4.0000 mg | ORAL_TABLET | ORAL | Status: DC | PRN

## 2020-10-07 MED ORDER — PRENATAL MULTIVITAMIN CH
1.0000 | ORAL_TABLET | Freq: Every day | ORAL | Status: DC
  Administered 2020-10-07 – 2020-10-09 (×3): 1 via ORAL
  Filled 2020-10-07 (×3): qty 1

## 2020-10-07 MED ORDER — FLEET ENEMA 7-19 GM/118ML RE ENEM: 1.0000 | ENEMA | RECTAL | Status: DC | PRN

## 2020-10-07 MED ORDER — PHENYLEPHRINE 40 MCG/ML (10ML) SYRINGE FOR IV PUSH (FOR BLOOD PRESSURE SUPPORT)
PREFILLED_SYRINGE | INTRAVENOUS | Status: AC
  Filled 2020-10-07: qty 10

## 2020-10-07 MED ORDER — PHENYLEPHRINE 40 MCG/ML (10ML) SYRINGE FOR IV PUSH (FOR BLOOD PRESSURE SUPPORT): 80.0000 ug | PREFILLED_SYRINGE | INTRAVENOUS | Status: DC | PRN

## 2020-10-07 MED ORDER — EPHEDRINE 5 MG/ML INJ: 10.0000 mg | INTRAVENOUS | Status: DC | PRN

## 2020-10-07 MED ORDER — ACETAMINOPHEN 325 MG PO TABS: 650.0000 mg | ORAL_TABLET | ORAL | Status: DC | PRN

## 2020-10-07 MED ORDER — SOD CITRATE-CITRIC ACID 500-334 MG/5ML PO SOLN: 30.0000 mL | ORAL | Status: DC | PRN

## 2020-10-07 MED ORDER — TETANUS-DIPHTH-ACELL PERTUSSIS 5-2.5-18.5 LF-MCG/0.5 IM SUSY: 0.5000 mL | PREFILLED_SYRINGE | Freq: Once | INTRAMUSCULAR | Status: DC

## 2020-10-07 MED ORDER — BENZOCAINE-MENTHOL 20-0.5 % EX AERO: 1.0000 "application " | INHALATION_SPRAY | CUTANEOUS | Status: DC | PRN

## 2020-10-07 MED ORDER — LIDOCAINE HCL (PF) 1 % IJ SOLN
INTRAMUSCULAR | Status: DC | PRN
  Administered 2020-10-07: 8 mL via EPIDURAL

## 2020-10-07 MED ORDER — SENNOSIDES-DOCUSATE SODIUM 8.6-50 MG PO TABS
2.0000 | ORAL_TABLET | ORAL | Status: DC
  Administered 2020-10-07 – 2020-10-09 (×3): 2 via ORAL
  Filled 2020-10-07 (×3): qty 2

## 2020-10-07 MED ORDER — LACTATED RINGERS IV SOLN: 500.0000 mL | Freq: Once | INTRAVENOUS | Status: DC

## 2020-10-07 MED ORDER — DIPHENHYDRAMINE HCL 50 MG/ML IJ SOLN
12.5000 mg | INTRAMUSCULAR | Status: DC | PRN
Start: 2020-10-07 — End: 2020-10-07

## 2020-10-07 MED ORDER — WITCH HAZEL-GLYCERIN EX PADS: 1.0000 "application " | MEDICATED_PAD | CUTANEOUS | Status: DC | PRN

## 2020-10-07 MED ORDER — LACTATED RINGERS IV SOLN: INTRAVENOUS | Status: DC

## 2020-10-07 MED ORDER — ONDANSETRON HCL 4 MG/2ML IJ SOLN: 4.0000 mg | INTRAMUSCULAR | Status: DC | PRN

## 2020-10-07 MED ORDER — LIDOCAINE HCL (PF) 1 % IJ SOLN
30.0000 mL | INTRAMUSCULAR | Status: DC | PRN
Start: 2020-10-07 — End: 2020-10-07
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
## 2020-10-07 NOTE — Anesthesia Procedure Notes (Signed)
Epidural Patient location during procedure: OB Start time: 10/07/2020 8:18 AM End time: 10/07/2020 8:22 AM  Preanesthetic Checklist Completed: patient identified, IV checked, site marked, risks and benefits discussed, surgical consent, monitors and equipment checked, pre-op evaluation and timeout performed  Epidural Patient position: sitting Prep: DuraPrep and site prepped and draped Patient monitoring: continuous pulse ox and blood pressure Approach: midline Location: L3-L4 Injection technique: LOR air  Needle:  Needle type: Tuohy  Needle gauge: 17 G Needle length: 9 cm and 9 Needle insertion depth: 6 cm Catheter type: closed end flexible Catheter size: 19 Gauge Catheter at skin depth: 12 cm Test dose: negative  Assessment Events: blood not aspirated, injection not painful, no injection resistance, no paresthesia and negative IV test

## 2020-10-07 NOTE — H&P (Signed)
OB ADMISSION/ HISTORY & PHYSICAL:  Admission Date: 10/07/2020  6:39 AM  Admit Diagnosis: Normal labor  Anita Robinson is a 25 y.o. female [phone] [redacted]w[redacted]d presenting for labor eval with possible loss of fluid. Endorses active FM, denies vaginal bleeding. Ctx began just after midnight. Previous C/S at 28 wks for PROM and abruption. Declined 17P this pregnancy. Advanced dilation on admission, discussed mode of delivery-trial of labor vs. repeat section. Pt will proceed w/ trial of labor. Vertex confirmed w/ bedside US. 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 Pt has not taken Zoloft since conception of this preg. Persistent e.coli bacteruria in 1st and 2nd trimester, refractory to oral treatment and IM Rocephin, managed on Keflex 500 mg daily for suppression.   Fetal status: Left kidney appears absent on anatomy and follow-up ultrasounds  History of current pregnancy: [id number]   Patient entered care with [facility] at 9+1 wks.   EDC by [place] @ 9+1 wks for uncertain LMP   Anatomy scan:  27+1 wks, posterior placenta, left kidney appears absent Last evaluation: 30+2  wks vertex. Posterior placenta/ AFI 18.4/ EFW 3# 12 oz (69%) Significant prenatal events:  Patient Active Problem List   Diagnosis Date Noted   Pregnancy 10/07/2020   GBS bacteriuria 10/03/2020    Prenatal Labs: ABO, Rh: --/--/O POS (08/15 0700) Antibody: NEG (08/15 0700) Rubella:   immune RPR: NON REACTIVE (10/10 0537)  HBsAg:   NR HIV:   NR GTT: passed 1 hr GBS:   unknown GC/CHL: nef Genetics: low-risk female Tdap/influenza vaccines: declined both   OB History  Gravida Para Term Preterm AB Living  4 2 1 1 1 2   SAB IAB Ectopic Multiple Live Births  1 0 0 0 2    # Outcome Date GA Lbr Len/2nd Weight Sex Delivery Anes PTL Lv  4 Current           3 Preterm 12/03/19 [redacted]w[redacted]d  1020 g F CS-LTranv Gen  LIV  2 Term 12/23/13 [redacted]w[redacted]d -16:59 / 01:15 3189 g M Vag-Spont EPI  LIV  1 SAB  [redacted]w[redacted]d           Medical /  Surgical History: Past medical history:  Past Medical History:  Diagnosis Date   Anemia    on Iron   Low iron     Past surgical history:  Past Surgical History:  Procedure Laterality Date   CESAREAN SECTION N/A 12/03/2019   Procedure: CESAREAN SECTION;  Surgeon: 02/02/2020, DO;  Location: [facility] LD [facility]S;  Service: Obstetrics;  Laterality: N/A;   NO PAST SURGERIES     Family History:  Family History  Problem Relation Age of Onset   Asthma Mother    Asthma Sister    Asthma Brother    Alcohol abuse Neg Hx    Arthritis Neg Hx    Birth defects Neg Hx    Cancer Neg Hx    COPD Neg Hx    Depression Neg Hx    Diabetes Neg Hx    Drug abuse Neg Hx    Early death Neg Hx    Hearing loss Neg Hx    Heart disease Neg Hx    Hyperlipidemia Neg Hx    Hypertension Neg Hx    Kidney disease Neg Hx    Learning disabilities Neg Hx    Mental illness Neg Hx    Mental retardation Neg Hx    Miscarriages / Stillbirths Neg Hx    Stroke Neg Hx  Vision loss Neg Hx    Varicose Veins Neg Hx     Social History:  reports that she has never smoked. She has never used smokeless tobacco. She reports that she does not currently use alcohol. She reports that she does not use drugs.  Allergies: Patient has no known allergies.   Current Medications at time of admission:  Prior to Admission medications   Medication Sig Start Date End Date Taking? Authorizing Provider  cefadroxil (DURICEF) 500 MG capsule Take 1 capsule (500 mg total) by mouth 2 (two) times daily for 7 days. 10/03/20 10/10/20  Venora Maples, MD  iron polysaccharides (NIFEREX) 150 MG capsule Take 1 capsule (150 mg total) by mouth daily. 12/05/19   Roma Schanz, CNM  magnesium oxide (MAG-OX) 400 (241.3 Mg) MG tablet Take 1 tablet (400 mg total) by mouth daily. Patient not taking: Reported on 09/28/2020 12/05/19   Roma Schanz, CNM  metroNIDAZOLE (FLAGYL) 500 MG tablet Take 1 tablet (500 mg total) by mouth 2 (two) times daily. 09/28/20    Marylene Land, CNM  Prenatal Vit-Fe Fumarate-FA (PRENATAL MULTIVITAMIN) TABS tablet Take 1 tablet by mouth daily at 12 noon.    [provider]  sertraline (ZOLOFT) 50 MG tablet Take 1 tablet (50 mg total) by mouth daily. Patient not taking: Reported on 09/28/2020 12/05/19   Roma Schanz, CNM    Review of Systems: Constitutional: Negative   HENT: Negative   Eyes: Negative   Respiratory: Negative   Cardiovascular: Negative   Gastrointestinal: Negative  Genitourinary: pos for bloody show, neg for LOF   Musculoskeletal: Negative   Skin: Negative   Neurological: Negative   Endo/Heme/Allergies: Negative   Psychiatric/Behavioral: Negative    Physical Exam: VS: Blood pressure (!) 121/56, pulse 88, temperature 98 F (36.7 C), temperature source Oral, resp. rate 20, last menstrual period 01/26/2020, SpO2 100 %, not currently breastfeeding. AAO x3, no signs of distress Cardiovascular: RRR Respiratory: Lung fields clear to ausculation GU/GI: Abdomen gravid, non-tender, non-distended, active FM, vertex Extremities: no edema, negative for pain, tenderness, and cords  Cervical exam:Dilation: 8 Effacement (%): 100 Station: -2 Exam by:: Monti Jilek,cnm FHR: baseline rate 150 / variability moderate / accelerations present / early decelerations TOCO: 2-4   Prenatal Transfer Tool  Maternal Diabetes: No Genetic Screening: Normal Maternal Ultrasounds/Referrals: Fetal Kidney Anomalies missing left kidney Fetal Ultrasounds or other Referrals:  Referred to Materal Fetal Medicine  Maternal Substance Abuse:  No Significant Maternal Medications:  Meds include: Other: Baby aspirin, Keflex 500 mg daily Significant Maternal Lab Results: Other: persistent e. Coli bacteruria    Assessment: 25 y.o. [id number] [redacted]w[redacted]d Prev C/S w/ advanced dilation, plans trial of labor  Active stage of labor FHR category 1 GBS unknown, PCR pending Pain management plan: epidural   Plan:  Admit  to L&D Routine admission orders Epidural PRN  Dr Su Hilt notified of admission and plan of care  Roma Schanz MSN, CNM 10/07/2020 8:17 AM

## 2020-10-07 NOTE — Lactation Note (Addendum)
This note was copied from a baby's chart. Lactation Consultation Note  Patient Name: Anita Robinson [id number] Date: 10/07/2020 Reason for consult: Initial assessment;Mother's request;1st time breastfeeding;Late-preterm 34-36.6wks;Other (Comment) (Anemia) (DAT tve) Age:27 hours  LC went over how to reduce calorie loss for LPTI.  LC gave 4 ml of EBM on spoon. Pediatrician arrived to examine infant. LC to return.   LC returned worked on latching nipples really large.  Infant can latch on right breast in laid back position. Mom to pace bottle feed after latching. Mom aware total feeding under 30 min. Infant left skin to skin with mom to regulate temp  Plan 1. To feed based on cues 8-12x in 24 hr period  2. Mom to place infant STS and offer breast 3. If unable to latch, mom to offer EBM via spoon then try latching, as volume increases supplement with pace bottle yellow slow flow nipple provided 4. Mom to pump on dEBP q 3 hrs for 15 min 5 I and O sheet reviewed.  6. LC brochure of inpatient and outpatient services reviewed.   Maternal Data Has patient been taught Hand Expression?: Yes Does the patient have breastfeeding experience prior to this delivery?: Yes How long did the patient breastfeed?: pumped and bottle fed infant in NICU for 8 weeks  Feeding Mother's Current Feeding Choice: Breast Milk  LATCH Score Latch: Grasps breast easily, tongue down, lips flanged, rhythmical sucking.  Audible Swallowing: Spontaneous and intermittent  Type of Nipple: Everted at rest and after stimulation  Comfort (Breast/Nipple): Soft / non-tender  Hold (Positioning): Assistance needed to correctly position infant at breast and maintain latch.  LATCH Score: 9   Lactation Tools Discussed/Used Tools: Pump Breast pump type: Double-Electric Breast Pump Pump Education: Setup, frequency, and cleaning;Milk Storage Reason for Pumping: increase stimulation Pumping frequency: every 3 hrs for 15  min  Interventions Interventions: Breast feeding basics reviewed;Breast compression;Assisted with latch;Adjust position;Skin to skin;Support pillows;DEBP;Breast massage;Position options;Hand express;Expressed milk;Education  Discharge Pump: Manual WIC Program: Yes  Consult Status Consult Status: Follow-up Date: 10/08/20 Follow-up type: In-patient    Eustolia Drennen  Nicholson-Springer 10/07/2020, 1:13 PM

## 2020-10-07 NOTE — Anesthesia Preprocedure Evaluation (Signed)
Anesthesia Evaluation  Patient identified by MRN, date of birth, ID band Patient awake    Reviewed: Allergy & Precautions, H&P , NPO status , Patient's Chart, lab work & pertinent test results, reviewed documented beta blocker date and time   Airway Mallampati: II  TM Distance: >3 FB Neck ROM: full    Dental no notable dental hx. (+) Teeth Intact, Dental Advisory Given   Pulmonary neg pulmonary ROS,    Pulmonary exam normal breath sounds clear to auscultation       Cardiovascular negative cardio ROS Normal cardiovascular exam Rhythm:regular Rate:Normal     Neuro/Psych negative neurological ROS  negative psych ROS   GI/Hepatic negative GI ROS, Neg liver ROS,   Endo/Other  negative endocrine ROS  Renal/GU negative Renal ROS  negative genitourinary   Musculoskeletal   Abdominal   Peds  Hematology  (+) Blood dyscrasia, anemia ,   Anesthesia Other Findings   Reproductive/Obstetrics (+) Pregnancy                             Anesthesia Physical Anesthesia Plan  ASA: 2  Anesthesia Plan: Epidural   Post-op Pain Management:    Induction:   PONV Risk Score and Plan:   Airway Management Planned: Natural Airway  Additional Equipment: None  Intra-op Plan:   Post-operative Plan:   Informed Consent: I have reviewed the patients History and Physical, chart, labs and discussed the procedure including the risks, benefits and alternatives for the proposed anesthesia with the patient or authorized representative who has indicated his/her understanding and acceptance.       Plan Discussed with: Anesthesiologist  Anesthesia Plan Comments:         Anesthesia Quick Evaluation

## 2020-10-07 NOTE — MAU Note (Signed)
Reports possible water broke as she was getting in MAU bed.  No fluid noted. Reports contractions every 2 min now, been contracting all night. No bleeding. Baby moving well.

## 2020-10-08 LAB — CBC
HCT: 23.7 % — ABNORMAL LOW (ref 36.0–46.0)
Hemoglobin: 6.9 g/dL — CL (ref 12.0–15.0)
MCH: 21.8 pg — ABNORMAL LOW (ref 26.0–34.0)
MCHC: 29.1 g/dL — ABNORMAL LOW (ref 30.0–36.0)
MCV: 74.8 fL — ABNORMAL LOW (ref 80.0–100.0)
Platelets: 295 10*3/uL (ref 150–400)
RBC: 3.17 MIL/uL — ABNORMAL LOW (ref 3.87–5.11)
RDW: 15.7 % — ABNORMAL HIGH (ref 11.5–15.5)
WBC: 9.8 10*3/uL (ref 4.0–10.5)
nRBC: 0.5 % — ABNORMAL HIGH (ref 0.0–0.2)

## 2020-10-08 MED ORDER — SODIUM CHLORIDE 0.9 % IV SOLN
500.0000 mg | Freq: Once | INTRAVENOUS | Status: AC
  Administered 2020-10-08: 500 mg via INTRAVENOUS
  Filled 2020-10-08: qty 25

## 2020-10-08 MED ORDER — LACTATED RINGERS IV BOLUS
1000.0000 mL | Freq: Once | INTRAVENOUS | Status: AC
  Administered 2020-10-08: 1000 mL via INTRAVENOUS

## 2020-10-08 NOTE — Clinical Social Work Maternal (Signed)
CLINICAL SOCIAL WORK MATERNAL/CHILD NOTE  Patient Details  Name: Anita Robinson MRN: [id number] Date of Birth: 12/19/1995  Date:  10/08/2020  Clinical Social Worker Initiating Note:  Laurey Arrow Date/Time: Initiated:  10/08/20/1153     Child's Name:  Shearon Stalls   Biological Parents:  Mother (MOB declined to provide CSW with any of FOB's contact information.)   Need for Interpreter:  None   Reason for Referral:  Behavioral Health Concerns   Address:  [address] [place] [address]    Phone number:  [phone] (home)     Additional phone number:  Household Members/Support Persons (HM/SP):   Household Member/Support Person 1, Household Member/Support Person 2   HM/SP Name Relationship DOB or Age  HM/SP -1 Illene Silver son 12/23/2013  HM/SP -2 Keirra Zeimet daughter 12/03/19  HM/SP -3        HM/SP -4        HM/SP -5        HM/SP -6        HM/SP -7        HM/SP -8          Natural Supports (not living in the home):  Spouse/significant other, Parent   Professional Supports: None   Employment: Full-time   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   Education:  [place] arranged:    Museum/gallery curator Resources:  [employer]   Other Resources:  [place] (Per MOB, she does not qualify for [employer].)   Cultural/Religious Considerations Which May Impact Care: None reported  Strengths:  Ability to meet basic needs  , Lexicographer chosen, Home prepared for child     Psychotropic Medications:         Pediatrician:    [place] area  Pediatrician List:   [facility]      Pediatrician Fax Number:    Risk Factors/Current Problems:  None   Cognitive State:  Able to Concentrate  , Linear Thinking  , Insightful     Mood/Affect:  Comfortable  , Agitated  , Irritable  , Flat  , Relaxed     CSW  Assessment: CSW met with MOB in room 506 to complete an assessment for MH hx. When CSW arrived, MOB was resting in bed and was on the telephone.  CSW offered to return at a later time and MOB declined.  MOB placed the phone on speaker and informed CSW that FOB was on the line and MOB wanted him to listen.  MOB gave verbal consent for CSW to complete the assessment while FOB was on speaker phone.   CSW asked about MOB thoughts and feelings about infant's NICU admission.  MOB communicated that she had a baby in the NICU last year and she is familiar with NICU visitation, resources, and supports. Without prompting, MOB shared that she wants to decline weekly CSW visits from NICU.  CSW was understanding encouraged MOB to reach out to NICU team if any other needs/concerns arise.   CSW asked about MOB's MH hx and MOB denied da hx.  CSW asked specifically about anxiety and depression and MOB denied both.  CSW asked about MOB's Rx for Zoloft and MOB communicated, "No, I don't have a need to take that."  CSW attempted to offer MOB PMADs education and MOB declined.  MOB reports having all  essential items for infant including a new car seat and crib.    CSW identifies no further need for intervention and no barriers to discharge when infant is medically ready.     CSW Plan/Description:  No Further Intervention Required/No Barriers to Discharge, Sudden Infant Death Syndrome (SIDS) Education, Perinatal Mood and Anxiety Disorder (PMADs) Education, Other Patient/Family Education, Other Information/Referral to [employer], MSW, [employer] Clinical Social Work [phone]  Dimple Nanas, LCSW 10/08/2020, 12:43 PM

## 2020-10-08 NOTE — Lactation Note (Signed)
This note was copied from a baby's chart. Lactation Consultation Note  Patient Name: Anita Robinson [id number] Date: 10/08/2020 Reason for consult: Follow-up assessment;NICU baby;Late-preterm 34-36.6wks;Hyperbilirubinemia Age:27 hours  Visited with mom of 68 hours old LPI female, baby was transferred to the NICU and mom is now pumping consistently. She was pumping but only on one side when entered the room, suggested bilateral pumping but mom voiced she didn't have any more colostrum containers, LC provided extra ones.  Mom is concerns about her milk not coming in, since she has a different experience with her last baby in NICU; where her milk came in the same day baby was born. Explained to mom that every pregnancy is different as it is every BF experienced and encouraged her to continue pumping consistently. Reviewed pumping schedule, lactogenesis II and pumping expectations.  Plan of care:  Encouraged mom to continue pumping every 3 hours Breast massage and hand expression were also encouraged prior pumping  No support person at this point, all questions and concerns answered, mom to call NICU LC PRN.  Maternal Data   Mom's supply is WNL   Feeding Mother's Current Feeding Choice: Breast Milk and Donor Milk Nipple Type: Nfant Slow Flow (purple)  Lactation Tools Discussed/Used Tools: Pump;Flanges Flange Size: 27 Breast pump type: Double-Electric Breast Pump Pump Education: Setup, frequency, and cleaning;Milk Storage Reason for Pumping: LPI in NICU, hyperbilirubinemia Pumping frequency: q 3 hours Pumped volume:  (drops)  Interventions Interventions: Breast feeding basics reviewed;DEBP;Education  Discharge Pump: DEBP  Consult Status Consult Status: Follow-up Follow-up type: In-patient    Anita Robinson Venetia Constable 10/08/2020, 6:54 PM

## 2020-10-08 NOTE — Anesthesia Postprocedure Evaluation (Signed)
Anesthesia Post Note  Patient: Anita Robinson  Procedure(s) Performed: AN AD HOC LABOR EPIDURAL     Patient location during evaluation: Mother Baby Anesthesia Type: Epidural Level of consciousness: awake and alert Pain management: pain level controlled Vital Signs Assessment: post-procedure vital signs reviewed and stable Respiratory status: spontaneous breathing, nonlabored ventilation and respiratory function stable Cardiovascular status: stable Postop Assessment: no headache, no backache and epidural receding Anesthetic complications: no   No notable events documented.  Last Vitals:  Vitals:   10/08/20 0001 10/08/20 0508  BP: 108/61 99/61  Pulse: 72 70  Resp: 16 16  Temp: 37.2 C 36.8 C  SpO2: 100% 100%    Last Pain:  Vitals:   10/08/20 0826  TempSrc:   PainSc: 4                  Meyah Corle

## 2020-10-08 NOTE — Anesthesia Postprocedure Evaluation (Signed)
Anesthesia Post Note  Patient: Anita Robinson  Procedure(s) Performed: AN AD HOC LABOR EPIDURAL     Patient location during evaluation: Mother Baby Anesthesia Type: Epidural Level of consciousness: awake and alert Pain management: pain level controlled Vital Signs Assessment: post-procedure vital signs reviewed and stable Respiratory status: spontaneous breathing, nonlabored ventilation and respiratory function stable Cardiovascular status: stable Postop Assessment: no headache, no backache and epidural receding Anesthetic complications: no   No notable events documented.  Last Vitals:  Vitals:   10/08/20 0001 10/08/20 0508  BP: 108/61 99/61  Pulse: 72 70  Resp: 16 16  Temp: 37.2 C 36.8 C  SpO2: 100% 100%    Last Pain:  Vitals:   10/08/20 0826  TempSrc:   PainSc: 4                  [name]     

## 2020-10-08 NOTE — Progress Notes (Signed)
PPD# 1 SVD w/ intact perineum  Information for the patient's newborn:  Anita Robinson, [place] [[id number]]  female   Baby Name: Angus Palms Circumcision requesting in pt circ   S:   Reports feeling "tired" Tolerating PO fluid and solids No nausea or vomiting Bleeding is light Pain controlled with acetaminophen and ibuprofen (OTC) Up ad lib / ambulatory / voiding w/o difficulty Feeding: Breast    O:   VS: BP 99/61 (BP Location: Right Arm)   Pulse 70   Temp 98.3 F (36.8 C) (Oral)   Resp 16   LMP 01/26/2020   SpO2 100%   Breastfeeding Unknown   LABS:  Recent Labs    10/07/20 0700 10/08/20 0556  WBC 9.1 9.8  HGB 8.7* 6.9*  PLT 345 295   Blood type: --/--/O POS (08/15 0700) Rubella:                        I&O: Intake/Output      08/15 0701 08/16 0700 08/16 0701 08/17 0700   Blood 250    Total Output 250    Net -250         Urine Occurrence 1 x      Physical Exam: Alert and oriented X3 Lungs: Clear and unlabored Heart: regular rate and rhythm / no mumurs Abdomen: soft, non-tender, non-distended  Fundus: firm, non-tender, U/1 Perineum: intact Lochia: moderate Extremities: no edema, no calf pain or tenderness    A:  PPD # 1  Normal exam Hgb 6.9, tired but otherwise asymptomatic  P:  Routine post partum orders IV iron   Plan reviewed w/ Dr. Larena Sox, MSN, CNM 10/08/2020, 10:08 AM

## 2020-10-09 DIAGNOSIS — D509 Iron deficiency anemia, unspecified: Secondary | ICD-10-CM | POA: Diagnosis present

## 2020-10-09 MED ORDER — POLYSACCHARIDE IRON COMPLEX 150 MG PO CAPS: 150.0000 mg | ORAL_CAPSULE | Freq: Every day | ORAL | 2 refills | Status: DC

## 2020-10-09 MED ORDER — ACETAMINOPHEN 325 MG PO TABS: 650.0000 mg | ORAL_TABLET | ORAL | Status: DC | PRN

## 2020-10-09 MED ORDER — IBUPROFEN 600 MG PO TABS: 600.0000 mg | ORAL_TABLET | Freq: Four times a day (QID) | ORAL | 0 refills | Status: DC

## 2020-10-09 MED ORDER — POLYSACCHARIDE IRON COMPLEX 150 MG PO CAPS: 150.0000 mg | ORAL_CAPSULE | Freq: Every day | ORAL | Status: DC

## 2020-10-09 NOTE — Discharge Summary (Signed)
Postpartum Discharge Summary  Date of Service updated 10/09/20     Patient Name: Anita Robinson DOB: 06-16-1995 MRN: [id number]  Date of admission: 10/07/2020 Delivery date:10/07/2020  Delivering provider: Burman Foster B  Date of discharge: 10/09/2020  Admitting diagnosis: Pregnancy [Z34.90] Intrauterine pregnancy: [redacted]w[redacted]d     Secondary diagnosis:  Active Problems:   Pregnancy   VBAC, delivered, current hospitalization  Additional problems: none    Discharge diagnosis: VBAC and iron deficient anemia                                               Post partum procedures: iron infusion Augmentation: N/A Complications: None  Hospital course: Onset of Labor With Vaginal Delivery      25 y.o. yo [phone] at [redacted]w[redacted]d was admitted in Active Labor on 10/07/2020. Patient had an uncomplicated labor course as follows:  Membrane Rupture Time/Date: 9:09 AM ,10/07/2020   Delivery Method:VBAC, Spontaneous  Episiotomy: None  Lacerations:  None  Patient had an uncomplicated postpartum course.  She is ambulating, tolerating a regular diet, passing flatus, and urinating well. Patient is discharged home in stable condition on 10/09/20.  Newborn Data: Birth date:10/07/2020  Birth time:9:19 AM  Gender:Female  Living status:Living  Apgars:9 ,9  Weight:3135 g   Magnesium Sulfate received: No BMZ received: No Rhophylac:N/A MMR:N/A Transfusion:No  Physical exam  Vitals:   10/08/20 0001 10/08/20 0508 10/08/20 2016 10/09/20 0513  BP: 108/61 99/61 112/63 103/64  Pulse: 72 70 67 68  Resp: $Remo'16 16 18 16  'lYgKJ$ Temp: 98.9 F (37.2 C) 98.3 F (36.8 C) 98.6 F (37 C) 98.2 F (36.8 C)  TempSrc: Oral Oral Oral Oral  SpO2: 100% 100% 100% (!) 10%   General: alert, cooperative, and no distress Lochia: appropriate Uterine Fundus: firm Incision: N/A DVT Evaluation: No evidence of DVT seen on physical exam. No cords or calf tenderness. No significant calf/ankle edema. Labs: Lab Results  Component Value  Date   WBC 9.8 10/08/2020   HGB 6.9 (LL) 10/08/2020   HCT 23.7 (L) 10/08/2020   MCV 74.8 (L) 10/08/2020   PLT 295 10/08/2020   CMP Latest Ref Rng & Units 02/29/2020  Glucose 70 - 99 mg/dL 96  BUN 6 - 20 mg/dL 9  Creatinine 0.44 - 1.00 mg/dL 0.77  Sodium 135 - 145 mmol/L 129(L)  Potassium 3.5 - 5.1 mmol/L 3.2(L)  Chloride 98 - 111 mmol/L 98  CO2 22 - 32 mmol/L 22  Calcium 8.9 - 10.3 mg/dL 8.6(L)  Total Protein 6.5 - 8.1 g/dL 6.9  Total Bilirubin 0.3 - 1.2 mg/dL 0.3  Alkaline Phos 38 - 126 U/L 54  AST 15 - 41 U/L 18  ALT 0 - 44 U/L 16   Edinburgh Score: Edinburgh Postnatal Depression Scale Screening Tool 12/05/2019  I have been able to laugh and see the funny side of things. 1  I have looked forward with enjoyment to things. 1  I have blamed myself unnecessarily when things went wrong. 3  I have been anxious or worried for no good reason. 2  I have felt scared or panicky for no good reason. 0  Things have been getting on top of me. 2  I have been so unhappy that I have had difficulty sleeping. 2  I have felt sad or miserable. 2  I have been so unhappy that I  have been crying. 2  The thought of harming myself has occurred to me. 0  Edinburgh Postnatal Depression Scale Total 15      After visit meds:  Allergies as of 10/09/2020   No Known Allergies      Medication List     STOP taking these medications    cefadroxil 500 MG capsule Commonly known as: DURICEF   magnesium oxide 400 (241.3 Mg) MG tablet Commonly known as: MAG-OX   metroNIDAZOLE 500 MG tablet Commonly known as: FLAGYL   sertraline 50 MG tablet Commonly known as: ZOLOFT       TAKE these medications    acetaminophen 325 MG tablet Commonly known as: Tylenol Take 2 tablets (650 mg total) by mouth every 4 (four) hours as needed (for pain scale < 4).   ibuprofen 600 MG tablet Commonly known as: ADVIL Take 1 tablet (600 mg total) by mouth every 6 (six) hours.   iron polysaccharides 150 MG  capsule Commonly known as: NIFEREX Take 1 capsule (150 mg total) by mouth daily.   prenatal multivitamin Tabs tablet Take 1 tablet by mouth daily at 12 noon.         Discharge home in stable condition Infant Feeding: Bottle and Breast Infant Disposition:NICU Discharge instruction: per After Visit Summary and Postpartum booklet. Activity: Advance as tolerated. Pelvic rest for 6 weeks.  Diet: routine diet Anticipated Birth Control: Unsure and information provided Postpartum Appointment:6 weeks Additional Postpartum F/U:  none Future Appointments:No future appointments. Follow up Visit:  [facility]. Go in 6 week(s).   Specialty: Obstetrics and Gynecology Contact information: [address] [place] [address] [phone]                    10/09/2020 Arrie Eastern, CNM

## 2020-10-09 NOTE — Lactation Note (Signed)
This note was copied from a baby's chart. Lactation Consultation Note Mother is unsure about continuing to pump. She did not pump overnight and has only pumped 1x today. After d/c today, she plans to go to [facility] to pick up a loaner pump. She is aware of LC services in NICU.  POC (if mother decides to continue pumping/bf;ing): Pump q3 and deliver EBM to NICU Pick up pump at [facility] p d/c today Begin sts/bf'ing when baby is ready. Ice breasts if s/s of engorgement  Patient Name: Anita Robinson [id number] Date: 10/09/2020 Reason for consult: Follow-up assessment Age:27 hours  Maternal Data  Pumping frequency: 1x today No s/s engorgement on day 2 Feeding Mother's Current Feeding Choice: Breast Milk and Donor Milk Nipple Type: (P) Nfant Slow Flow (purple)  Interventions Interventions: Education  Discharge Discharge Education: Engorgement and breast care  Consult Status Consult Status: Follow-up Follow-up type: In-patient   Elder Negus, MA IBCLC 10/09/2020, 11:46 AM

## 2020-10-17 NOTE — Patient Instructions (Addendum)
   Anita Robinson  10/17/2020   Your procedure is scheduled on:  10/21/2020  Arrive at 7:15 AM at Entrance C on [address] at [facility]  and [employer]. You are invited to use the FREE valet parking or use the Visitor's parking deck.  Pick up the phone at the desk and dial [phone].  Call this number if you have problems the morning of surgery: [phone]  Remember:   Do not eat food:(After Midnight) Desps de medianoche.  Do not drink clear liquids: (After Midnight) Desps de medianoche.  Take these medicines the morning of surgery with A SIP OF WATER:  none   Do not wear jewelry, make-up or nail polish.  Do not wear lotions, powders, or perfumes. Do not wear deodorant.  Do not shave 48 hours prior to surgery.  Do not bring valuables to the hospital.  [facility] is not   responsible for any belongings or valuables brought to the hospital.  Contacts, dentures or bridgework may not be worn into surgery.  Leave suitcase in the car. After surgery it may be brought to your room.  For patients admitted to the hospital, checkout time is 11:00 AM the day of              discharge.      Please read over the following fact sheets that you were given:     Preparing for Surgery

## 2020-10-18 ENCOUNTER — Other Ambulatory Visit: Payer: Self-pay

## 2020-10-18 ENCOUNTER — Inpatient Hospital Stay (HOSPITAL_COMMUNITY)
Admission: RE | Admit: 2020-10-18 | Discharge: 2020-10-18 | Disposition: A | Discharge status: Home / Self Care | Payer: Medicaid Other | Source: Ambulatory Visit | Attending: Obstetrics and Gynecology | Admitting: Obstetrics and Gynecology

## 2020-10-18 ENCOUNTER — Telehealth: Payer: Self-pay | Admitting: Family Medicine

## 2020-10-18 ENCOUNTER — Inpatient Hospital Stay (HOSPITAL_COMMUNITY): Admit: 2020-10-18 | Payer: Medicaid Other | Admitting: Obstetrics and Gynecology

## 2020-10-18 ENCOUNTER — Telehealth (HOSPITAL_COMMUNITY): Payer: Self-pay

## 2020-10-18 NOTE — Telephone Encounter (Addendum)
"  I'm good." Patient declines questions or concerns about her healing.  "He's good. Eating and gaining weight. He sleeps in a crib." RN reviewed ABC's of safe sleep with patient. Patient declines any questions or concerns about baby.  RN started doing edinburgh postnatal depression scale with patient. After question 1 the phone line disconnected. Attempted to call patient back twice with no answer.   Marcelino Duster [facility] 10/18/2020,1725

## 2020-10-18 NOTE — Telephone Encounter (Signed)
Called patient as I was notified by mychart that she had not yet seen her results of her urine culture.  She reports ongoing urinary symptoms, discussed with her that her prescription is waiting at the pharmacy if she chooses to pick it up.  Venora Maples, MD/MPH Attending Family Medicine Physician, [facility] for [facility], [facility]

## 2020-10-21 ENCOUNTER — Inpatient Hospital Stay (HOSPITAL_COMMUNITY)
Admission: RE | Admit: 2020-10-21 | Payer: Medicaid Other | Source: Home / Self Care | Admitting: Obstetrics and Gynecology

## 2020-10-21 ENCOUNTER — Encounter (HOSPITAL_COMMUNITY): Admission: RE | Payer: Self-pay | Source: Home / Self Care

## 2020-10-21 SURGERY — Surgical Case
Anesthesia: Regional

## 2020-10-24 DIAGNOSIS — Z419 Encounter for procedure for purposes other than remedying health state, unspecified: Secondary | ICD-10-CM | POA: Diagnosis not present

## 2020-11-23 DIAGNOSIS — Z419 Encounter for procedure for purposes other than remedying health state, unspecified: Secondary | ICD-10-CM | POA: Diagnosis not present

## 2020-12-24 DIAGNOSIS — Z419 Encounter for procedure for purposes other than remedying health state, unspecified: Secondary | ICD-10-CM | POA: Diagnosis not present

## 2021-01-04 ENCOUNTER — Inpatient Hospital Stay (HOSPITAL_COMMUNITY)
Admission: AD | Admit: 2021-01-04 | Discharge: 2021-01-04 | Disposition: A | Discharge status: Home / Self Care | Payer: BC Managed Care – PPO | Attending: Obstetrics and Gynecology | Admitting: Obstetrics and Gynecology

## 2021-01-04 ENCOUNTER — Other Ambulatory Visit: Payer: Self-pay

## 2021-01-04 ENCOUNTER — Encounter (HOSPITAL_COMMUNITY): Payer: Self-pay | Admitting: Obstetrics and Gynecology

## 2021-01-04 ENCOUNTER — Telehealth: Payer: Self-pay

## 2021-01-04 ENCOUNTER — Other Ambulatory Visit: Payer: Self-pay | Admitting: Certified Nurse Midwife

## 2021-01-04 DIAGNOSIS — N898 Other specified noninflammatory disorders of vagina: Secondary | ICD-10-CM

## 2021-01-04 DIAGNOSIS — B9689 Other specified bacterial agents as the cause of diseases classified elsewhere: Secondary | ICD-10-CM | POA: Insufficient documentation

## 2021-01-04 DIAGNOSIS — Z3A01 Less than 8 weeks gestation of pregnancy: Secondary | ICD-10-CM

## 2021-01-04 DIAGNOSIS — O23591 Infection of other part of genital tract in pregnancy, first trimester: Secondary | ICD-10-CM | POA: Insufficient documentation

## 2021-01-04 DIAGNOSIS — R8290 Unspecified abnormal findings in urine: Secondary | ICD-10-CM | POA: Diagnosis present

## 2021-01-04 LAB — POCT PREGNANCY, URINE: Preg Test, Ur: POSITIVE — AB

## 2021-01-04 LAB — WET PREP, GENITAL
Clue Cells Wet Prep HPF POC: NONE SEEN
Sperm: NONE SEEN
Trich, Wet Prep: NONE SEEN
Yeast Wet Prep HPF POC: NONE SEEN

## 2021-01-04 LAB — URINALYSIS, ROUTINE W REFLEX MICROSCOPIC
Bilirubin Urine: NEGATIVE
Glucose, UA: NEGATIVE mg/dL
Hgb urine dipstick: NEGATIVE
Ketones, ur: NEGATIVE mg/dL
Nitrite: NEGATIVE
Protein, ur: NEGATIVE mg/dL
Specific Gravity, Urine: 1.014 (ref 1.005–1.030)
pH: 7 (ref 5.0–8.0)

## 2021-01-04 MED ORDER — METRONIDAZOLE 0.75 % VA GEL: 1.0000 | Freq: Two times a day (BID) | VAGINAL | 0 refills | Status: DC

## 2021-01-04 NOTE — MAU Note (Signed)
Anita Robinson is a 25 y.o. here in MAU reporting: is here for confirmation of pregnancy, states she may have missed a period or 2. Also reporting some discharge and foul smelling urine. Discharge is white with a foul odor. Denies other UTI symptoms. No pain or bleeding. Has not done a pregnancy test at home.   LMP: 11/13/20  Onset of complaint: ongoing  Pain score: 0/10  Vitals:   01/04/21 0845  BP: (!) 111/53  Pulse: 98  Resp: 16  Temp: 98.7 F (37.1 C)  SpO2: 100%     Lab orders placed from triage: upt

## 2021-01-04 NOTE — Telephone Encounter (Signed)
Patient called to say that she was supposed to get a prescription and it was not there,. Messaged L&D NP who stated they would send script, called patient back to let her know.

## 2021-01-04 NOTE — MAU Provider Note (Signed)
Event Date/Time   First Provider Initiated Contact with Patient 01/04/21 0913     S [name] is a 25 y.o. [phone] pregnant female who presents to MAU today with complaint of vaginal discharge and possible UTI because her urine has an odor. She is also concerned she may be pregnant, she delivered her last baby on 10/07/20. Thinks she has missed a period or two, has not taken a pregnancy test at home.  Receives OB care from [facility].  O BP (!) 111/53 (BP Location: Right Arm)   Pulse 98   Temp 98.7 F (37.1 C) (Oral)   Resp 16   Ht 5\' 5"  (1.651 m)   Wt 163 lb 8 oz (74.2 kg)   LMP 11/13/2020   SpO2 100% Comment: room air  Breastfeeding No   BMI 27.21 kg/m  Physical Exam Vitals and nursing note reviewed.  Constitutional:      Appearance: Normal appearance.  HENT:     Head: Normocephalic.  Eyes:     Pupils: Pupils are equal, round, and reactive to light.  Cardiovascular:     Rate and Rhythm: Normal rate.  Pulmonary:     Effort: Pulmonary effort is normal.  Musculoskeletal:        General: Normal range of motion.     Cervical back: Normal range of motion.  Skin:    General: Skin is warm.     Capillary Refill: Capillary refill takes less than 2 seconds.  Neurological:     Mental Status: She is alert and oriented to person, place, and time.  Psychiatric:        Mood and Affect: Mood normal.        Behavior: Behavior normal.        Thought Content: Thought content normal.        Judgment: Judgment normal.   UPT negative, UA, wet prep and GC/CT collected and normal except wet prep. Pending GC/CT, we will follow and manage accordingly.    A Bacterial vaginosis - metrogel sent to pharmacy Medical screening exam complete  P Discharge from MAU in stable condition No follow-ups on file. Warning signs for worsening condition that would warrant emergency follow-up discussed Patient may return to MAU as needed for emergent OB/GYN related  complaints  11/15/2020, CNM 01/04/2021 9:21 AM

## 2021-01-04 NOTE — Progress Notes (Signed)
Seen in MAU earlier today, script for BV sent to pharmacy. GC still pending, will manage accordinly. [name], CNM, MSN, IBCLC Certified Nurse Midwife, [facility]

## 2021-01-06 LAB — GC/CHLAMYDIA PROBE AMP (~~LOC~~) NOT AT ARMC
Chlamydia: NEGATIVE
Comment: NEGATIVE
Comment: NORMAL
Neisseria Gonorrhea: NEGATIVE

## 2021-01-19 ENCOUNTER — Encounter (HOSPITAL_COMMUNITY): Payer: Self-pay | Admitting: Obstetrics and Gynecology

## 2021-01-19 ENCOUNTER — Inpatient Hospital Stay (HOSPITAL_COMMUNITY): Payer: BC Managed Care – PPO

## 2021-01-19 ENCOUNTER — Inpatient Hospital Stay (HOSPITAL_COMMUNITY)
Admission: AD | Admit: 2021-01-19 | Discharge: 2021-01-19 | Disposition: A | Discharge status: Home / Self Care | Payer: BC Managed Care – PPO | Attending: Obstetrics and Gynecology | Admitting: Obstetrics and Gynecology

## 2021-01-19 ENCOUNTER — Other Ambulatory Visit: Payer: Self-pay

## 2021-01-19 DIAGNOSIS — O26891 Other specified pregnancy related conditions, first trimester: Secondary | ICD-10-CM | POA: Diagnosis not present

## 2021-01-19 DIAGNOSIS — O468X1 Other antepartum hemorrhage, first trimester: Secondary | ICD-10-CM | POA: Diagnosis not present

## 2021-01-19 DIAGNOSIS — O209 Hemorrhage in early pregnancy, unspecified: Secondary | ICD-10-CM | POA: Diagnosis present

## 2021-01-19 DIAGNOSIS — R109 Unspecified abdominal pain: Secondary | ICD-10-CM | POA: Insufficient documentation

## 2021-01-19 DIAGNOSIS — Z3A08 8 weeks gestation of pregnancy: Secondary | ICD-10-CM | POA: Diagnosis not present

## 2021-01-19 DIAGNOSIS — O208 Other hemorrhage in early pregnancy: Secondary | ICD-10-CM | POA: Diagnosis not present

## 2021-01-19 DIAGNOSIS — O418X10 Other specified disorders of amniotic fluid and membranes, first trimester, not applicable or unspecified: Secondary | ICD-10-CM | POA: Diagnosis not present

## 2021-01-19 DIAGNOSIS — Z3491 Encounter for supervision of normal pregnancy, unspecified, first trimester: Secondary | ICD-10-CM

## 2021-01-19 LAB — URINALYSIS, ROUTINE W REFLEX MICROSCOPIC
Bilirubin Urine: NEGATIVE
Glucose, UA: NEGATIVE mg/dL
Hgb urine dipstick: NEGATIVE
Ketones, ur: NEGATIVE mg/dL
Nitrite: NEGATIVE
Protein, ur: NEGATIVE mg/dL
Specific Gravity, Urine: 1.018 (ref 1.005–1.030)
pH: 6 (ref 5.0–8.0)

## 2021-01-19 LAB — CBC
HCT: 33.7 % — ABNORMAL LOW (ref 36.0–46.0)
Hemoglobin: 10.4 g/dL — ABNORMAL LOW (ref 12.0–15.0)
MCH: 23.7 pg — ABNORMAL LOW (ref 26.0–34.0)
MCHC: 30.9 g/dL (ref 30.0–36.0)
MCV: 76.9 fL — ABNORMAL LOW (ref 80.0–100.0)
Platelets: 270 10*3/uL (ref 150–400)
RBC: 4.38 MIL/uL (ref 3.87–5.11)
RDW: 14.5 % (ref 11.5–15.5)
WBC: 6.5 10*3/uL (ref 4.0–10.5)
nRBC: 0 % (ref 0.0–0.2)

## 2021-01-19 LAB — HCG, QUANTITATIVE, PREGNANCY: hCG, Beta Chain, Quant, S: 118896 m[IU]/mL — ABNORMAL HIGH (ref <5)

## 2021-01-19 MED ORDER — ACETAMINOPHEN 500 MG PO TABS
1000.0000 mg | ORAL_TABLET | Freq: Once | ORAL | Status: AC
  Administered 2021-01-19: 09:00:00 1000 mg via ORAL
  Filled 2021-01-19: qty 2

## 2021-01-19 NOTE — Discharge Instructions (Signed)
  [facility] Providers          [facility]  [address], [place], [place] [address]  [phone]  [facility]  [address], [place], [place] [address]  [phone]  [facility]  [address], [place], [place] [address]  [phone]  [facility] [place] [facility]  [address], [place], [place] [address]  [phone]  [facility]  [address]First floor), [place], [place] [address]  [phone]  [facility] [address], [place], [place] [address] [phone]  [facility]  [address], [place], [place] [address]  [phone]  [facility]  [address], [place], [place] [address]  [phone]  [facility]  [address], [place], [place] [address]  [phone]  [facility]  [address], [place], [place] [address]  [phone]  [facility]  [address], [place], [place] [address]  [phone]  [facility]  [address], [place], [place] [address]  [phone]  [facility]   [address], [place], [place] [address]  [phone]  [facility]  [address], [place], [place] [address]   [phone]  [facility]  [address], , [place] [address]  [phone]         

## 2021-01-19 NOTE — MAU Provider Note (Signed)
History     CSN: [id number]  Arrival date and time: 01/19/21 6578   Event Date/Time   First Provider Initiated Contact with Patient 01/19/21 [phone]      Chief Complaint  Patient presents with   Abdominal Pain   Vaginal Bleeding   HPI Anita Robinson is a 25 y.o. [id number] at [redacted]w[redacted]d who presents with abdominal pain. Symptoms started this morning. Reports constant pain in her left lower quadrant that radiate to her suprapubic area. Rates pain 5/10. Nothing makes pain worse. Hasn't treated symptoms. Has had nausea with the pregnancy. Denies vomiting, diarrhea, constipation, dysuria, fever, or vaginal discharge. Had one episode of pink/red spotting this morning - no further spotting or bleeding. Last intercourse was 2 days ago. Has not been seen anywhere yet with this pregnancy. Does not plan to return to [facility].   OB History     Gravida  5   Para  3   Term  1   Preterm  2   AB  1   Living  3      SAB  1   IAB  0   Ectopic  0   Multiple  0   Live Births  3           Past Medical History:  Diagnosis Date   Anemia    on Iron    Past Surgical History:  Procedure Laterality Date   CESAREAN SECTION N/A 12/03/2019   Procedure: CESAREAN SECTION;  Surgeon: Steva Ready, DO;  Location: [facility] LD ORS;  Service: Obstetrics;  Laterality: N/A;    Family History  Problem Relation Age of Onset   Asthma Mother    Asthma Sister    Asthma Brother    Alcohol abuse Neg Hx    Arthritis Neg Hx    Birth defects Neg Hx    Cancer Neg Hx    COPD Neg Hx    Depression Neg Hx    Diabetes Neg Hx    Drug abuse Neg Hx    Early death Neg Hx    Hearing loss Neg Hx    Heart disease Neg Hx    Hyperlipidemia Neg Hx    Hypertension Neg Hx    Kidney disease Neg Hx    Learning disabilities Neg Hx    Mental illness Neg Hx    Mental retardation Neg Hx    Miscarriages / Stillbirths Neg Hx    Stroke Neg Hx    Vision loss Neg Hx    Varicose Veins Neg Hx     Social History   Tobacco  Use   Smoking status: Never   Smokeless tobacco: Never  Vaping Use   Vaping Use: Never used  Substance Use Topics   Alcohol use: Not Currently    Comment: social drinker   Drug use: No    Allergies: No Known Allergies  No medications prior to admission.    Review of Systems  Constitutional: Negative.   Gastrointestinal:  Positive for abdominal pain and nausea. Negative for constipation, diarrhea and vomiting.  Genitourinary: Negative.   Physical Exam   Blood pressure (!) 108/58, pulse 78, temperature 99.1 F (37.3 C), resp. rate 16, height 5\' 5"  (1.651 m), weight 74.6 kg, last menstrual period 11/13/2020, SpO2 100 %, not currently breastfeeding.  Physical Exam Vitals and nursing note reviewed.  Constitutional:      General: She is not in acute distress.    Appearance: She is well-developed.  HENT:  Head: Normocephalic and atraumatic.  Eyes:     General: No scleral icterus.    Pupils: Pupils are equal, round, and reactive to light.  Pulmonary:     Effort: Pulmonary effort is normal. No respiratory distress.  Abdominal:     General: Abdomen is flat.     Palpations: Abdomen is soft.     Tenderness: There is no abdominal tenderness. There is no right CVA tenderness, left CVA tenderness, guarding or rebound.  Skin:    General: Skin is warm and dry.  Neurological:     General: No focal deficit present.     Mental Status: She is alert.  Psychiatric:        Mood and Affect: Mood normal.        Behavior: Behavior normal.    MAU Course  Procedures Results for orders placed or performed during the hospital encounter of 01/19/21 (from the past 24 hour(s))  Urinalysis, Routine w reflex microscopic Urine, Clean Catch     Status: Abnormal   Collection Time: 01/19/21  8:30 AM  Result Value Ref Range   Color, Urine YELLOW YELLOW   APPearance CLEAR CLEAR   Specific Gravity, Urine 1.018 1.005 - 1.030   pH 6.0 5.0 - 8.0   Glucose, UA NEGATIVE NEGATIVE mg/dL   Hgb urine  dipstick NEGATIVE NEGATIVE   Bilirubin Urine NEGATIVE NEGATIVE   Ketones, ur NEGATIVE NEGATIVE mg/dL   Protein, ur NEGATIVE NEGATIVE mg/dL   Nitrite NEGATIVE NEGATIVE   Leukocytes,Ua SMALL (A) NEGATIVE   RBC / HPF 0-5 0 - 5 RBC/hpf   WBC, UA 11-20 0 - 5 WBC/hpf   Bacteria, UA RARE (A) NONE SEEN   Squamous Epithelial / LPF 0-5 0 - 5   Mucus PRESENT   CBC     Status: Abnormal   Collection Time: 01/19/21  9:24 AM  Result Value Ref Range   WBC 6.5 4.0 - 10.5 K/uL   RBC 4.38 3.87 - 5.11 MIL/uL   Hemoglobin 10.4 (L) 12.0 - 15.0 g/dL   HCT 13.2 (L) 44.0 - 10.2 %   MCV 76.9 (L) 80.0 - 100.0 fL   MCH 23.7 (L) 26.0 - 34.0 pg   MCHC 30.9 30.0 - 36.0 g/dL   RDW 72.5 36.6 - 44.0 %   Platelets 270 150 - 400 K/uL   nRBC 0.0 0.0 - 0.2 %  hCG, quantitative, pregnancy     Status: Abnormal   Collection Time: 01/19/21  9:24 AM  Result Value Ref Range   hCG, Beta Chain, Quant, S 118,896 (H) <5 mIU/mL   US OB Comp Less 14 Wks  Result Date: 01/19/2021 CLINICAL DATA:  Abdominal pain in first trimester of pregnancy, LMP 11/13/2020; quantitative beta hCG not currently available for comparison EXAM: OBSTETRIC <14 WK ULTRASOUND TECHNIQUE: Transabdominal ultrasound was performed for evaluation of the gestation as well as the maternal uterus and adnexal regions. COMPARISON:  None for this gestation FINDINGS: Intrauterine gestational sac: Present, single Yolk sac:  Present Embryo:  Present Cardiac Activity: Present Heart Rate: 162 bpm CRL:   19.3 mm   8 w 3 d                  [place] EDC: 08/28/2021 Subchorionic hemorrhage:  Small subchronic hemorrhage Maternal uterus/adnexae: Small intramural leiomyoma at uterine fundus 3.0 x 2.6 x 2.3 cm. Uterus otherwise unremarkable. Small corpus luteum within RIGHT ovary, ovaries otherwise unremarkable. No free pelvic fluid or adnexal masses. IMPRESSION: Single live intrauterine gestation at 85  weeks 3 days EGA by crown-rump length. Small subchronic hemorrhage. Electronically Signed    By: Ulyses Southward M.D.   On: 01/19/2021 10:05    MDM +UPT UA, CBC, ABO/Rh, quant hCG, and [place] today to rule out ectopic pregnancy which can be life threatening.   RH positive  Has vaginal swabs done within the last 2 weeks & was treated for BV. Declines repeat testing.   Symptoms improved with tylenol given in MAU Ultrasound shows live IUP measuring [redacted]w[redacted]d (EDD updated) & a small subchorionic hematoma.   Assessment and Plan   1. Abdominal pain during pregnancy in first trimester  -improved w/tylenol - continue tylenol prn -u/a with small leuks. No urinary complaints & no CVA tenderness. Urine culture pending  2. Subchorionic hematoma in first trimester, single or unspecified fetus  -rh positive -reviewed bleeding precautions  3. Normal IUP (intrauterine pregnancy) on prenatal ultrasound, first trimester   4. [redacted] weeks gestation of pregnancy  -given list of providers - start prenatal care     Judeth Horn 01/19/2021, 10:58 AM

## 2021-01-19 NOTE — MAU Note (Signed)
...  Anita Robinson is a 25 y.o. at [redacted]w[redacted]d here in MAU reporting: left lower abdominal pain that began around 0700 this morning. She states she used the restroom and noted light pink spotting when she wiped. Denies any odors or abnormal discharge prior. Last IC two days ago.  Pain score:  5/10 left lower  Vitals:   01/19/21 0827  BP: 115/62  Pulse: 86  Resp: 19  Temp: 98.6 F (37 C)  SpO2: 100%     Lab orders placed from triage: UA

## 2021-01-21 LAB — CULTURE, OB URINE
Culture: 70000 — AB
Special Requests: NORMAL

## 2021-01-22 ENCOUNTER — Other Ambulatory Visit: Payer: Self-pay | Admitting: Advanced Practice Midwife

## 2021-01-22 DIAGNOSIS — O2341 Unspecified infection of urinary tract in pregnancy, first trimester: Secondary | ICD-10-CM

## 2021-01-22 MED ORDER — CEFADROXIL 500 MG PO CAPS: 500.0000 mg | ORAL_CAPSULE | Freq: Two times a day (BID) | ORAL | 0 refills | Status: DC

## 2021-01-22 NOTE — Progress Notes (Signed)
+   UTI on urine culture. Patient reached at home. Identity confirmed x 2. Allergy status and pharmacy confirmed. Patient denies questions or concerns at end of call.  Clayton Bibles, MSA, MSN, CNM Certified Nurse Midwife, Biochemist, clinical for [employer], [facility]

## 2021-01-23 DIAGNOSIS — Z419 Encounter for procedure for purposes other than remedying health state, unspecified: Secondary | ICD-10-CM | POA: Diagnosis not present

## 2021-02-10 ENCOUNTER — Ambulatory Visit (INDEPENDENT_AMBULATORY_CARE_PROVIDER_SITE_OTHER): Payer: Medicaid Other | Admitting: *Deleted

## 2021-02-10 ENCOUNTER — Other Ambulatory Visit: Payer: Self-pay

## 2021-02-10 VITALS — BP 108/59 | HR 79 | Temp 98.1°F | Ht 65.0 in | Wt 163.6 lb

## 2021-02-10 DIAGNOSIS — O2341 Unspecified infection of urinary tract in pregnancy, first trimester: Secondary | ICD-10-CM

## 2021-02-10 DIAGNOSIS — Z3480 Encounter for supervision of other normal pregnancy, unspecified trimester: Secondary | ICD-10-CM

## 2021-02-10 MED ORDER — BLOOD PRESSURE MONITOR AUTOMAT DEVI: 1.0000 | Freq: Every day | 0 refills | Status: DC

## 2021-02-10 MED ORDER — CEFADROXIL 500 MG PO CAPS: 500.0000 mg | ORAL_CAPSULE | Freq: Two times a day (BID) | ORAL | 0 refills | Status: DC

## 2021-02-10 NOTE — Patient Instructions (Signed)
    Please remember that if you are not able to keep your appointment to call the office and reschedule, or cancel. If you no-show or cancel with less than 24 hour notice we will charge you, not your insurance a $50 fee.  

## 2021-02-10 NOTE — Progress Notes (Addendum)
° ° °  I discussed the limitations, risks, security and privacy concerns of performing an evaluation and management service by telephone and the availability of in person appointments. I also discussed with the patient that there may be a patient responsible charge related to this service. The patient expressed understanding and agreed to proceed.   Location: [facility] Patient: clinic Provider: clinic Interpreter used: no professional interpreter  PRENATAL INTAKE SUMMARY  [name] presents today New OB Nurse Interview.  OB History     Gravida  5   Para  3   Term  1   Preterm  2   AB  1   Living  3      SAB  1   IAB  0   Ectopic  0   Multiple  0   Live Births  3          I have reviewed the patient's medical, obstetrical, social, and family histories, medications, and available lab results.  SUBJECTIVE She has no unusual complaints. 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 Reported dark brown discharge yesterday when wiping x 1.  OBJECTIVE Initial Nurse interview for history/labs (New OB)  last menstrual period date: 11/13/2020 Anita: 08/28/2021 by early ultrasound   GA: [redacted]w[redacted]d G5P1213 FHT: 152  GENERAL APPEARANCE: alert, well appearing, in no apparent distress, oriented to person, place and time   ASSESSMENT Normal pregnancy  PLAN Prenatal care:  [facility] Pnl/HIV/Hep C OB Urine Culture GC/CT PAP : need to get PAP records from [facility] HgbEval/SMA/CF ([facility]) [facility] A1C Rx for cefadroxil sent to pharmacy; patient did not pick up medication in time.  Blood Pressure Monitor/Weight Scale BP Rx sent to [facility] Weight Scale Home  MyChart/Babyscripts MyChart access verified. I explained pt will have some visits in office and some virtually. Babyscripts instructions given and order placed.   Placed OB Box on problem list and updated   Followup with Anita Robinson, CNM APP 02/14/2022 at 8:35 AM  Follow  Up Instructions:   I discussed the assessment and treatment plan with the patient. The patient was provided an opportunity to ask questions and all were answered. The patient agreed with the plan and demonstrated an understanding of the instructions.   The patient was advised to call back or seek an in-person evaluation if the symptoms worsen or if the condition fails to improve as anticipated.  I provided 40 minutes of  face-to-face time during this encounter.  Clovis Pu, RN

## 2021-02-11 LAB — OBSTETRIC PANEL, INCLUDING HIV
Antibody Screen: NEGATIVE
Basophils Absolute: 0 10*3/uL (ref 0.0–0.2)
Basos: 0 %
EOS (ABSOLUTE): 0.1 10*3/uL (ref 0.0–0.4)
Eos: 1 %
HIV Screen 4th Generation wRfx: NONREACTIVE
Hematocrit: 33.2 % — ABNORMAL LOW (ref 34.0–46.6)
Hemoglobin: 10.7 g/dL — ABNORMAL LOW (ref 11.1–15.9)
Hepatitis B Surface Ag: NEGATIVE
Immature Grans (Abs): 0 10*3/uL (ref 0.0–0.1)
Immature Granulocytes: 0 %
Lymphocytes Absolute: 1.4 10*3/uL (ref 0.7–3.1)
Lymphs: 22 %
MCH: 23.7 pg — ABNORMAL LOW (ref 26.6–33.0)
MCHC: 32.2 g/dL (ref 31.5–35.7)
MCV: 74 fL — ABNORMAL LOW (ref 79–97)
Monocytes Absolute: 0.4 10*3/uL (ref 0.1–0.9)
Monocytes: 6 %
Neutrophils Absolute: 4.5 10*3/uL (ref 1.4–7.0)
Neutrophils: 71 %
Platelets: 293 10*3/uL (ref 150–450)
RBC: 4.52 x10E6/uL (ref 3.77–5.28)
RDW: 12 % (ref 11.7–15.4)
RPR Ser Ql: NONREACTIVE
Rh Factor: POSITIVE
Rubella Antibodies, IGG: 5.22 index (ref >0.99)
WBC: 6.4 10*3/uL (ref 3.4–10.8)

## 2021-02-11 LAB — HEMOGLOBIN A1C
Est. average glucose Bld gHb Est-mCnc: 111 mg/dL
Hgb A1c MFr Bld: 5.5 % (ref 4.8–5.6)

## 2021-02-11 LAB — HEPATITIS C ANTIBODY: Hep C Virus Ab: 0.1 s/co ratio (ref 0.0–0.9)

## 2021-02-13 LAB — URINE CULTURE, OB REFLEX

## 2021-02-13 LAB — CULTURE, OB URINE

## 2021-02-14 ENCOUNTER — Other Ambulatory Visit: Payer: Self-pay | Admitting: Certified Nurse Midwife

## 2021-02-14 ENCOUNTER — Encounter: Payer: Medicaid Other | Admitting: Certified Nurse Midwife

## 2021-02-23 DIAGNOSIS — Z419 Encounter for procedure for purposes other than remedying health state, unspecified: Secondary | ICD-10-CM | POA: Diagnosis not present

## 2021-03-24 ENCOUNTER — Inpatient Hospital Stay (HOSPITAL_COMMUNITY)
Admission: AD | Admit: 2021-03-24 | Discharge: 2021-03-24 | Disposition: A | Discharge status: Home / Self Care | Payer: BC Managed Care – PPO | Attending: Family Medicine | Admitting: Family Medicine

## 2021-03-24 ENCOUNTER — Other Ambulatory Visit: Payer: Self-pay

## 2021-03-24 DIAGNOSIS — Z3A17 17 weeks gestation of pregnancy: Secondary | ICD-10-CM | POA: Insufficient documentation

## 2021-03-24 DIAGNOSIS — Z3492 Encounter for supervision of normal pregnancy, unspecified, second trimester: Secondary | ICD-10-CM | POA: Insufficient documentation

## 2021-03-24 DIAGNOSIS — N898 Other specified noninflammatory disorders of vagina: Secondary | ICD-10-CM | POA: Diagnosis not present

## 2021-03-24 LAB — WET PREP, GENITAL
Clue Cells Wet Prep HPF POC: NONE SEEN
Sperm: NONE SEEN
Trich, Wet Prep: NONE SEEN
WBC, Wet Prep HPF POC: <10 (ref <10)
Yeast Wet Prep HPF POC: NONE SEEN

## 2021-03-24 MED ORDER — MICONAZOLE NITRATE 2 % VA CREA: 1.0000 | TOPICAL_CREAM | Freq: Every day | VAGINAL | 2 refills | Status: DC

## 2021-03-24 MED ORDER — METRONIDAZOLE 500 MG PO TABS: 500.0000 mg | ORAL_TABLET | Freq: Two times a day (BID) | ORAL | 0 refills | Status: AC

## 2021-03-24 NOTE — MAU Note (Signed)
Pt blind swabbed in bathroom, wet prep sent to lab

## 2021-03-24 NOTE — MAU Provider Note (Signed)
History     CSN: [id number]  Arrival date and time: 03/24/21 7371   Event Date/Time   First Provider Initiated Contact with Patient 03/24/21 0805      Chief Complaint  Patient presents with   Vaginal Discharge   HPI 25yo [id number] at [redacted]w[redacted]d who is here with vaginal discharge over the past few days. White discharge, foul odor. This is typical of her normal BV. She typically gets yeast infection with treatment of BV. No other concerns: no bleeding, cramping, contractions.  She had some metrogel and used it last night.  OB History     Gravida  5   Para  3   Term  1   Preterm  2   AB  1   Living  3      SAB  1   IAB  0   Ectopic  0   Multiple  0   Live Births  3           Past Medical History:  Diagnosis Date   Anemia    on Iron    Past Surgical History:  Procedure Laterality Date   CESAREAN SECTION N/A 12/03/2019   Procedure: CESAREAN SECTION;  Surgeon: Steva Ready, DO;  Location: [facility] LD ORS;  Service: Obstetrics;  Laterality: N/A;    Family History  Problem Relation Age of Onset   Asthma Mother    Asthma Sister    Asthma Brother    Alcohol abuse Neg Hx    Arthritis Neg Hx    Birth defects Neg Hx    Cancer Neg Hx    COPD Neg Hx    Depression Neg Hx    Diabetes Neg Hx    Drug abuse Neg Hx    Early death Neg Hx    Hearing loss Neg Hx    Heart disease Neg Hx    Hyperlipidemia Neg Hx    Hypertension Neg Hx    Kidney disease Neg Hx    Learning disabilities Neg Hx    Mental illness Neg Hx    Mental retardation Neg Hx    Miscarriages / Stillbirths Neg Hx    Stroke Neg Hx    Vision loss Neg Hx    Varicose Veins Neg Hx     Social History   Tobacco Use   Smoking status: Never    Passive exposure: Never   Smokeless tobacco: Never  Vaping Use   Vaping Use: Never used  Substance Use Topics   Alcohol use: Not Currently    Comment: social drinker   Drug use: No    Allergies: No Known Allergies  Medications Prior to Admission   Medication Sig Dispense Refill Last Dose   acetaminophen (TYLENOL) 325 MG tablet Take 2 tablets (650 mg total) by mouth every 4 (four) hours as needed (for pain scale < 4). (Patient not taking: Reported on 02/10/2021)      Blood Pressure Monitoring (BLOOD PRESSURE MONITOR AUTOMAT) DEVI 1 Device by Does not apply route daily. Automatic blood pressure cuff regular size. To monitor blood pressure regularly at home. ICD-10 code:Z34.90 1 each 0    cefadroxil (DURICEF) 500 MG capsule Take 1 capsule (500 mg total) by mouth 2 (two) times daily. 14 capsule 0    Prenatal Vit-Fe Fumarate-FA (PRENATAL MULTIVITAMIN) TABS tablet Take 1 tablet by mouth daily at 12 noon.       Review of Systems Physical Exam   Blood pressure 114/62, pulse 92, temperature 98.7 F (37.1 C),  temperature source Oral, resp. rate 16, height 5\' 5"  (1.651 m), weight 77 kg, last menstrual period 11/13/2020, SpO2 100 %, not currently breastfeeding.  Physical Exam Vitals reviewed.  Constitutional:      Appearance: Normal appearance.  HENT:     Head: Normocephalic and atraumatic.  Cardiovascular:     Rate and Rhythm: Normal rate and regular rhythm.  Pulmonary:     Effort: Pulmonary effort is normal.     Breath sounds: Normal breath sounds.  Abdominal:     General: Abdomen is flat.     Palpations: Abdomen is soft.  Skin:    General: Skin is warm and dry.     Capillary Refill: Capillary refill takes less than 2 seconds.  Neurological:     General: No focal deficit present.     Mental Status: She is alert.  Psychiatric:        Mood and Affect: Mood normal.        Behavior: Behavior normal.        Thought Content: Thought content normal.        Judgment: Judgment normal.   Results for orders placed or performed during the hospital encounter of 03/24/21 (from the past 24 hour(s))  Wet prep, genital     Status: None   Collection Time: 03/24/21  8:10 AM   Specimen: Genital  Result Value Ref Range   Yeast Wet Prep HPF POC  NONE SEEN NONE SEEN   Trich, Wet Prep NONE SEEN NONE SEEN   Clue Cells Wet Prep HPF POC NONE SEEN NONE SEEN   WBC, Wet Prep HPF POC <10 <10   Sperm NONE SEEN     MAU Course  Procedures  MDM Could be false negative if used metrogel last night. Will cover and prescribe flagyl and monistat.  Assessment and Plan   1. [redacted] weeks gestation of pregnancy   2. Vaginal discharge    Treat for BV for concerns of false neg due to using monistat last night.  Monistat to cover prophylactically for yeast. Recommended boric acid to improve pH balance.  F/u as needed. Pt declined AVS.  03/26/21 [facility] 03/24/2021, 8:08 AM

## 2021-03-24 NOTE — MAU Note (Signed)
Anita Robinson is a 26 y.o. at [redacted]w[redacted]d here in MAU reporting: states pH balance is off and she knows she has BV. Is reporting white discharge that has an odor.  Onset of complaint: yesterday  Pain score: 0/10  Vitals:   03/24/21 0800  BP: 114/62  Pulse: 92  Resp: 16  Temp: 98.7 F (37.1 C)  SpO2: 100%     FHT:150  Lab orders placed from triage: none

## 2021-03-26 DIAGNOSIS — Z419 Encounter for procedure for purposes other than remedying health state, unspecified: Secondary | ICD-10-CM | POA: Diagnosis not present

## 2021-04-01 DIAGNOSIS — O09292 Supervision of pregnancy with other poor reproductive or obstetric history, second trimester: Secondary | ICD-10-CM | POA: Diagnosis not present

## 2021-04-01 DIAGNOSIS — Z3A18 18 weeks gestation of pregnancy: Secondary | ICD-10-CM | POA: Diagnosis not present

## 2021-04-01 DIAGNOSIS — D649 Anemia, unspecified: Secondary | ICD-10-CM | POA: Diagnosis not present

## 2021-04-01 DIAGNOSIS — O09892 Supervision of other high risk pregnancies, second trimester: Secondary | ICD-10-CM | POA: Diagnosis not present

## 2021-04-01 DIAGNOSIS — Z8759 Personal history of other complications of pregnancy, childbirth and the puerperium: Secondary | ICD-10-CM | POA: Diagnosis not present

## 2021-04-01 DIAGNOSIS — O09212 Supervision of pregnancy with history of pre-term labor, second trimester: Secondary | ICD-10-CM | POA: Diagnosis not present

## 2021-04-01 DIAGNOSIS — O99012 Anemia complicating pregnancy, second trimester: Secondary | ICD-10-CM | POA: Diagnosis not present

## 2021-04-01 DIAGNOSIS — O34211 Maternal care for low transverse scar from previous cesarean delivery: Secondary | ICD-10-CM | POA: Diagnosis not present

## 2021-04-01 DIAGNOSIS — Z8742 Personal history of other diseases of the female genital tract: Secondary | ICD-10-CM | POA: Diagnosis not present

## 2021-04-23 DIAGNOSIS — Z419 Encounter for procedure for purposes other than remedying health state, unspecified: Secondary | ICD-10-CM | POA: Diagnosis not present

## 2021-05-23 DIAGNOSIS — Z8742 Personal history of other diseases of the female genital tract: Secondary | ICD-10-CM | POA: Insufficient documentation

## 2021-05-23 DIAGNOSIS — Z8751 Personal history of pre-term labor: Secondary | ICD-10-CM | POA: Insufficient documentation

## 2021-05-24 DIAGNOSIS — Z419 Encounter for procedure for purposes other than remedying health state, unspecified: Secondary | ICD-10-CM | POA: Diagnosis not present

## 2021-06-23 DIAGNOSIS — Z419 Encounter for procedure for purposes other than remedying health state, unspecified: Secondary | ICD-10-CM | POA: Diagnosis not present

## 2021-07-05 ENCOUNTER — Inpatient Hospital Stay (HOSPITAL_BASED_OUTPATIENT_CLINIC_OR_DEPARTMENT_OTHER): Payer: BC Managed Care – PPO

## 2021-07-05 ENCOUNTER — Other Ambulatory Visit: Payer: Self-pay

## 2021-07-05 ENCOUNTER — Encounter (HOSPITAL_COMMUNITY): Payer: Self-pay | Admitting: Obstetrics & Gynecology

## 2021-07-05 ENCOUNTER — Inpatient Hospital Stay (HOSPITAL_COMMUNITY)
Admission: AD | Admit: 2021-07-05 | Discharge: 2021-07-05 | Disposition: A | Discharge status: Home / Self Care | Payer: BC Managed Care – PPO | Attending: Obstetrics & Gynecology | Admitting: Obstetrics & Gynecology

## 2021-07-05 DIAGNOSIS — R109 Unspecified abdominal pain: Secondary | ICD-10-CM

## 2021-07-05 DIAGNOSIS — O26893 Other specified pregnancy related conditions, third trimester: Secondary | ICD-10-CM

## 2021-07-05 DIAGNOSIS — Y939 Activity, unspecified: Secondary | ICD-10-CM | POA: Diagnosis not present

## 2021-07-05 DIAGNOSIS — Z3A32 32 weeks gestation of pregnancy: Secondary | ICD-10-CM | POA: Diagnosis not present

## 2021-07-05 DIAGNOSIS — O9A219 Injury, poisoning and certain other consequences of external causes complicating pregnancy, unspecified trimester: Secondary | ICD-10-CM

## 2021-07-05 DIAGNOSIS — Z3480 Encounter for supervision of other normal pregnancy, unspecified trimester: Secondary | ICD-10-CM

## 2021-07-05 DIAGNOSIS — R102 Pelvic and perineal pain: Secondary | ICD-10-CM | POA: Insufficient documentation

## 2021-07-05 MED ORDER — LACTATED RINGERS IV BOLUS
1000.0000 mL | Freq: Once | INTRAVENOUS | Status: AC
  Administered 2021-07-05: 1000 mL via INTRAVENOUS
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
## 2021-07-05 NOTE — MAU Note (Signed)
Provider recommended  tocolytic's since her cervix had changed a little more. Went of Risks and benefits and pt declined. Notified N.Nugent of pt decision.  ?
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
## 2021-07-24 DIAGNOSIS — Z419 Encounter for procedure for purposes other than remedying health state, unspecified: Secondary | ICD-10-CM | POA: Diagnosis not present

## 2021-08-06 IMAGING — US US OB < 14 WEEKS - US OB TV
1 series · 15 of 28 positions shown · non-contrast
Comparison: None.

CLINICAL DATA: Pregnant, vaginal bleeding, LMP 01/26/2020

EXAM:
OBSTETRIC <14 WK US AND TRANSVAGINAL OB US
TECHNIQUE: Both transabdominal and transvaginal ultrasound examinations were
performed for complete evaluation of the gestation as well as the
maternal uterus, adnexal regions, and pelvic cul-de-sac.
Transvaginal technique was performed to assess early pregnancy.

[Series 1: us ob < 14 weeks - us ob tv · 59 acquisitions, 15 frames shown]
[im 1/59]
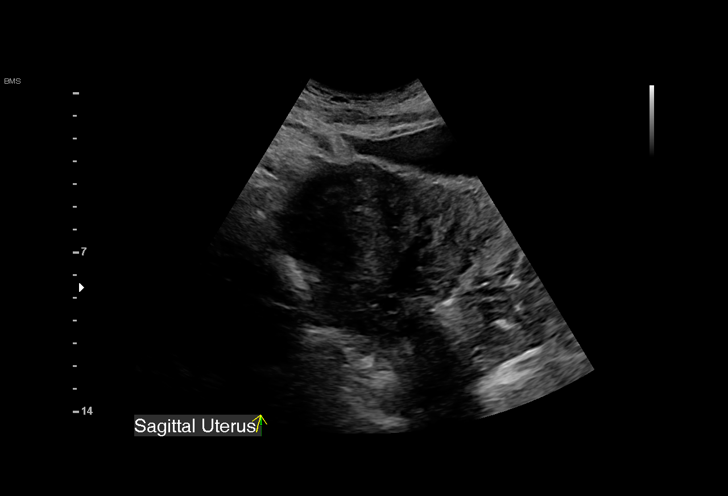
[im 5/59]
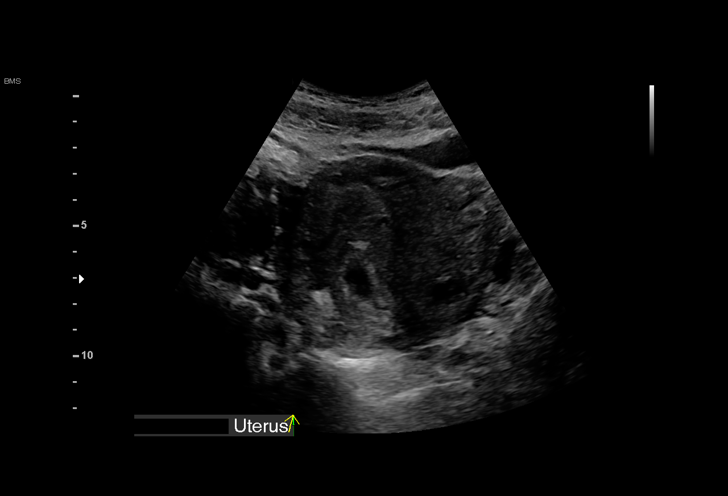
[im 9/59]
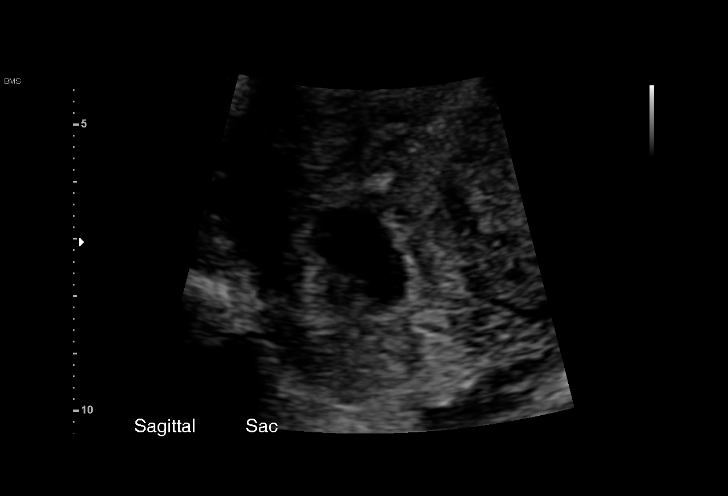
[im 13/59]
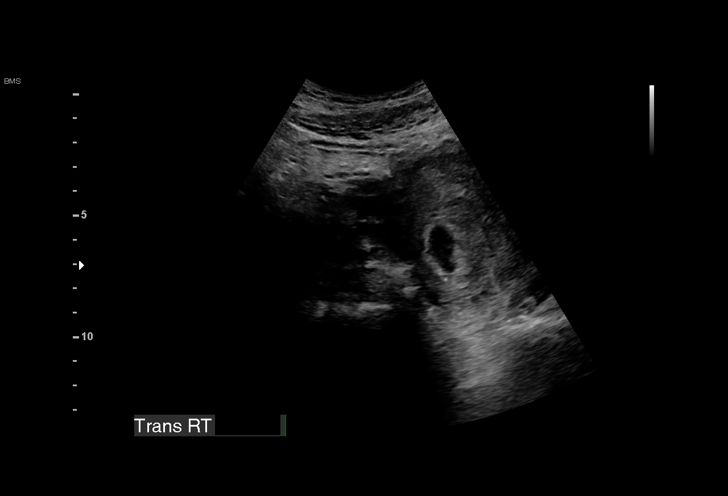
[im 18/59]
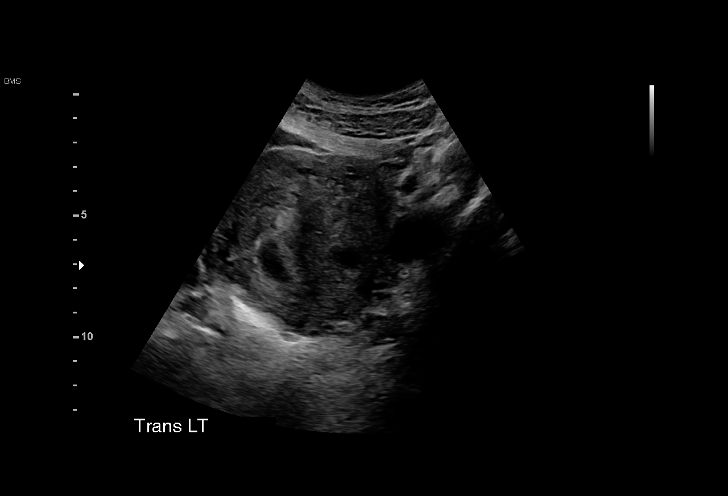
[im 22/59]
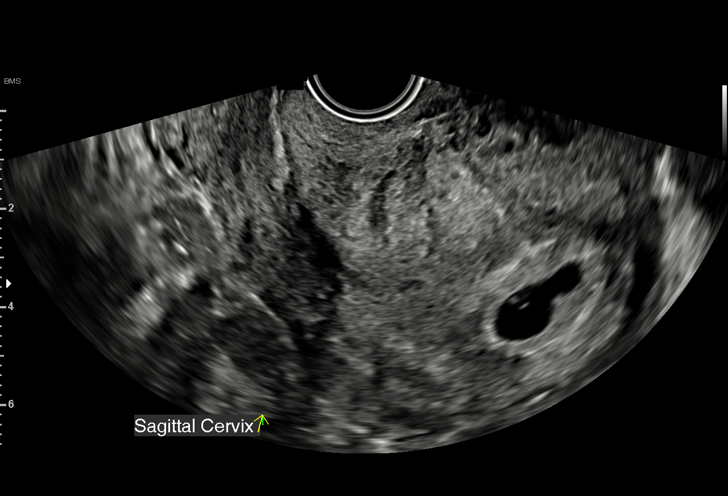
[im 26/59]
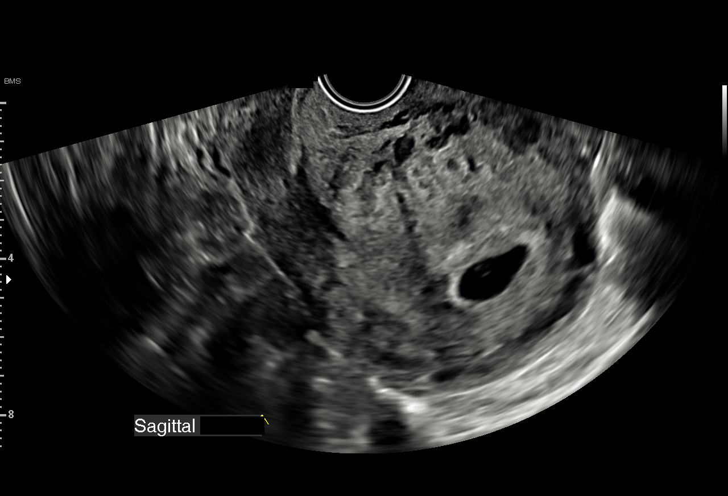
[im 31/59]
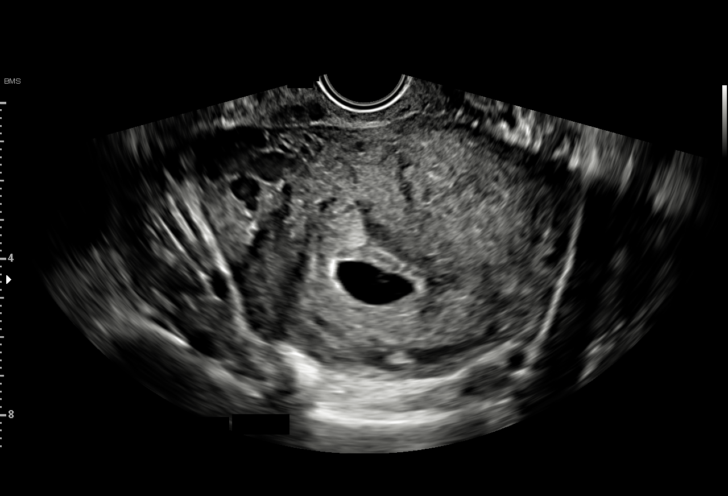
[im 33/59]
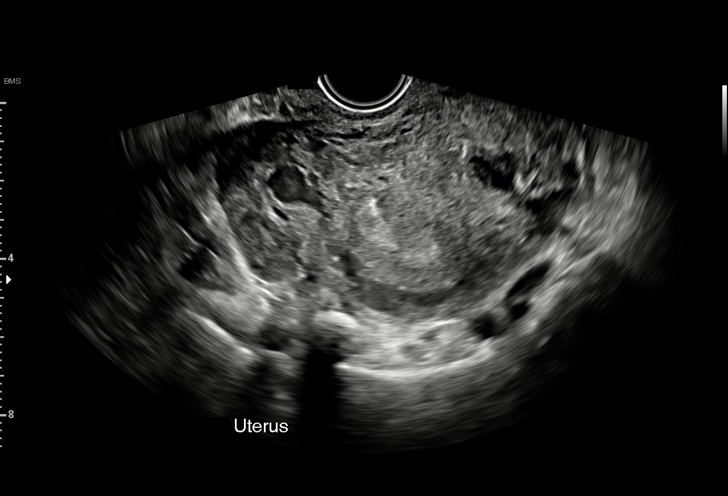
[im 37/59]
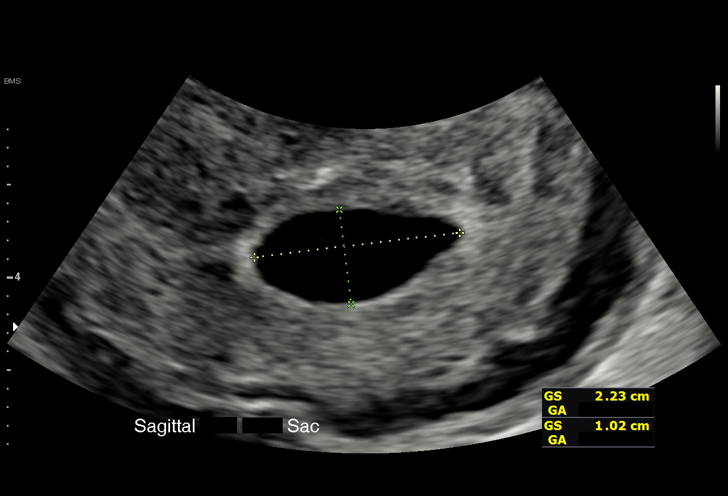
[im 41/59]
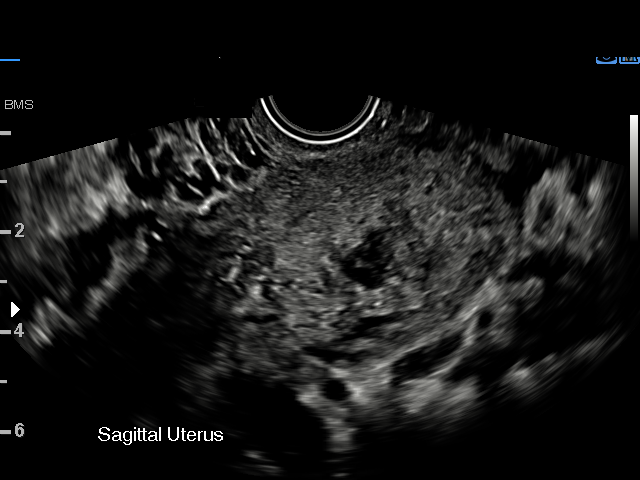
[im 46/59]
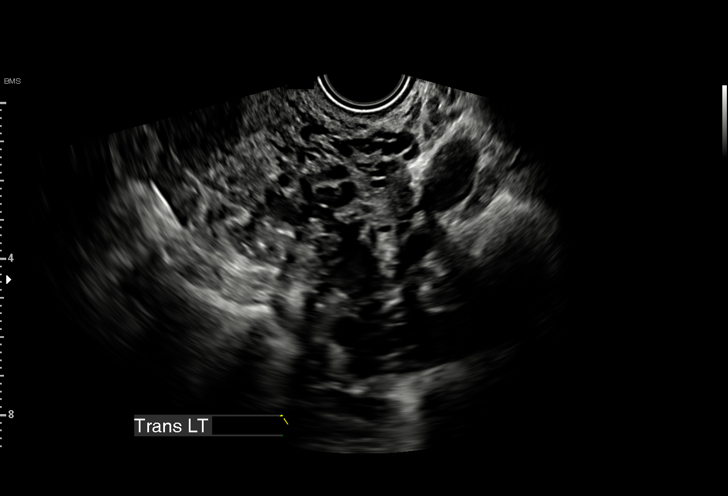
[im 50/59]
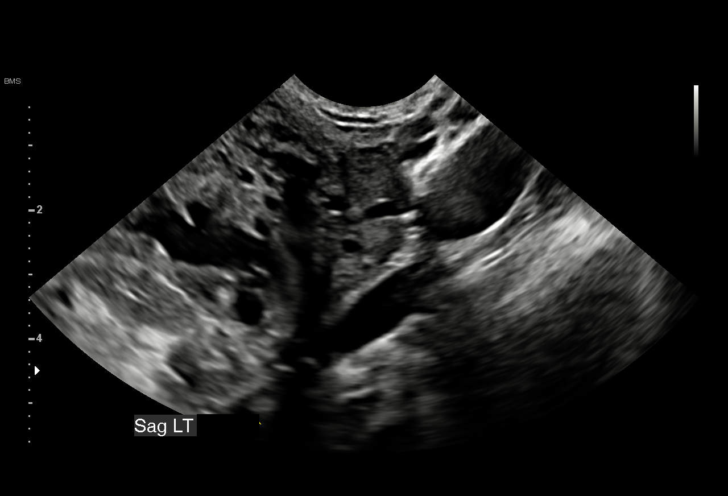
[im 54/59]
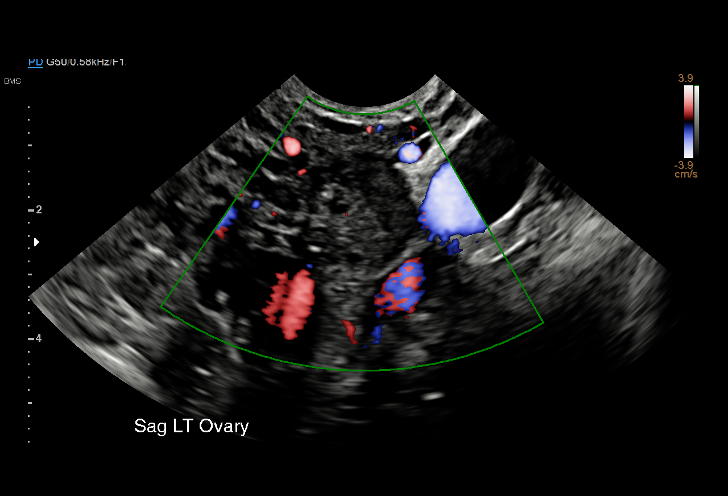
[im 59/59]
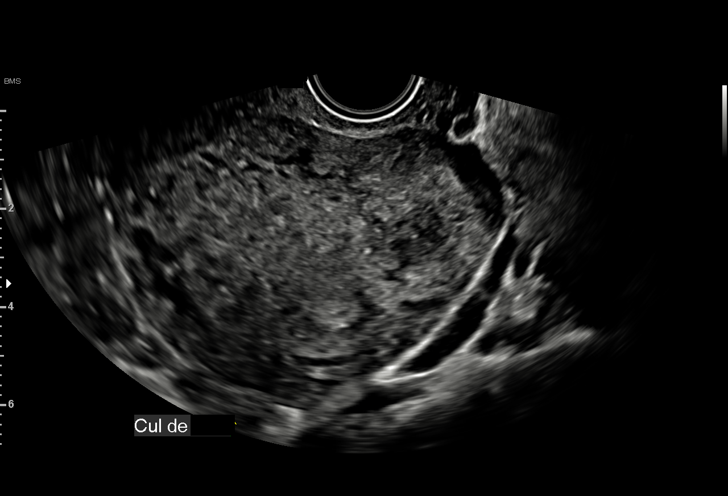

[15 of 28 positions shown; findings below may reference images not displayed]

FINDINGS: Intrauterine gestational sac: Present, single

Yolk sac:  Present, single, normal-appearing

Embryo:  Not visualized

Cardiac Activity: Not applicable

MSD: 17 mm   6 w   3 d

Subchorionic hemorrhage: A small subchorionic hemorrhage is present,
best seen on image # 39.

Maternal uterus/adnexae: The uterus is retroverted. A
heterogeneously hypoechoic hypervascular mass measuring 2.6 x 3.1 x
4.2 cm is seen within the left uterine fundus compatible with a
uterine fibroid. The cervix is closed and is unremarkable. There is
no free fluid within the cul-de-sac. The maternal left ovary is
unremarkable. The right ovary is not visualized.
IMPRESSION: Intrauterine gestational sac. Identified single yolk sac. This
roughly estimates the gestational between 5.5 and 6.0 weeks.
Follow-up sonography is recommended in 10-14 days to document
appropriate progression and better age the gestation.

Small subchorionic hemorrhage

## 2021-08-23 DIAGNOSIS — Z419 Encounter for procedure for purposes other than remedying health state, unspecified: Secondary | ICD-10-CM | POA: Diagnosis not present

## 2021-09-23 DIAGNOSIS — Z419 Encounter for procedure for purposes other than remedying health state, unspecified: Secondary | ICD-10-CM | POA: Diagnosis not present

## 2021-10-24 DIAGNOSIS — Z419 Encounter for procedure for purposes other than remedying health state, unspecified: Secondary | ICD-10-CM | POA: Diagnosis not present

## 2021-11-23 DIAGNOSIS — Z419 Encounter for procedure for purposes other than remedying health state, unspecified: Secondary | ICD-10-CM | POA: Diagnosis not present

## 2021-12-24 DIAGNOSIS — Z419 Encounter for procedure for purposes other than remedying health state, unspecified: Secondary | ICD-10-CM | POA: Diagnosis not present

## 2022-01-03 ENCOUNTER — Emergency Department (HOSPITAL_COMMUNITY)
Admission: EM | Admit: 2022-01-03 | Discharge: 2022-01-04 | Disposition: A | Discharge status: Home / Self Care | Payer: BC Managed Care – PPO | Attending: Student | Admitting: Student

## 2022-01-03 ENCOUNTER — Emergency Department (HOSPITAL_COMMUNITY): Payer: BC Managed Care – PPO

## 2022-01-03 ENCOUNTER — Encounter (HOSPITAL_COMMUNITY): Payer: Self-pay

## 2022-01-03 ENCOUNTER — Other Ambulatory Visit: Payer: Self-pay

## 2022-01-03 DIAGNOSIS — N898 Other specified noninflammatory disorders of vagina: Secondary | ICD-10-CM | POA: Insufficient documentation

## 2022-01-03 DIAGNOSIS — R8289 Other abnormal findings on cytological and histological examination of urine: Secondary | ICD-10-CM | POA: Diagnosis not present

## 2022-01-03 DIAGNOSIS — S02612A Fracture of condylar process of left mandible, initial encounter for closed fracture: Secondary | ICD-10-CM | POA: Diagnosis not present

## 2022-01-03 DIAGNOSIS — S0993XA Unspecified injury of face, initial encounter: Secondary | ICD-10-CM | POA: Diagnosis present

## 2022-01-03 DIAGNOSIS — S02609A Fracture of mandible, unspecified, initial encounter for closed fracture: Secondary | ICD-10-CM
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
## 2022-01-04 DIAGNOSIS — S02612A Fracture of condylar process of left mandible, initial encounter for closed fracture: Secondary | ICD-10-CM | POA: Diagnosis not present

## 2022-01-04 LAB — URINALYSIS, ROUTINE W REFLEX MICROSCOPIC
Bacteria, UA: NONE SEEN
Bilirubin Urine: NEGATIVE
Glucose, UA: NEGATIVE mg/dL
Hgb urine dipstick: NEGATIVE
Ketones, ur: NEGATIVE mg/dL
Nitrite: NEGATIVE
Protein, ur: NEGATIVE mg/dL
Specific Gravity, Urine: 1.019 (ref 1.005–1.030)
pH: 6 (ref 5.0–8.0)

## 2022-01-04 LAB — PREGNANCY, URINE: Preg Test, Ur: NEGATIVE

## 2022-01-04 LAB — WET PREP, GENITAL
Clue Cells Wet Prep HPF POC: NONE SEEN
Sperm: NONE SEEN
Trich, Wet Prep: NONE SEEN
WBC, Wet Prep HPF POC: <10 (ref <10)
Yeast Wet Prep HPF POC: NONE SEEN

## 2022-01-04 MED ORDER — FLUCONAZOLE 150 MG PO TABS: 150.0000 mg | ORAL_TABLET | Freq: Every day | ORAL | 0 refills | Status: AC

## 2022-01-04 MED ORDER — NAPROXEN 375 MG PO TABS: 375.0000 mg | ORAL_TABLET | Freq: Two times a day (BID) | ORAL | 0 refills | Status: DC

## 2022-01-04 MED ORDER — AMOXICILLIN-POT CLAVULANATE 875-125 MG PO TABS: 1.0000 | ORAL_TABLET | Freq: Two times a day (BID) | ORAL | 0 refills | Status: DC

## 2022-01-04 MED ORDER — METRONIDAZOLE 500 MG PO TABS: 500.0000 mg | ORAL_TABLET | Freq: Two times a day (BID) | ORAL | 0 refills | Status: DC

## 2022-01-04 MED ORDER — ACETAMINOPHEN 500 MG PO TABS
1000.0000 mg | ORAL_TABLET | Freq: Once | ORAL | Status: AC
  Administered 2022-01-04: 1000 mg via ORAL
  Filled 2022-01-04: qty 2

## 2022-01-04 MED ORDER — FLUCONAZOLE 150 MG PO TABS: 150.0000 mg | ORAL_TABLET | Freq: Every day | ORAL | 0 refills | Status: DC
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
## 2022-01-04 NOTE — ED Notes (Signed)
Pt. Self swabbed for gc and wet prep specimen.

## 2022-01-05 LAB — GC/CHLAMYDIA PROBE AMP (~~LOC~~) NOT AT ARMC
Chlamydia: NEGATIVE
Comment: NEGATIVE
Comment: NORMAL
Neisseria Gonorrhea: NEGATIVE

## 2022-01-06 ENCOUNTER — Ambulatory Visit: Payer: Self-pay | Admitting: Oral Surgery
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
## 2022-01-07 ENCOUNTER — Inpatient Hospital Stay: Admit: 2022-01-07 | Payer: BC Managed Care – PPO | Admitting: Oral Surgery

## 2022-01-07 SURGERY — CLOSED REDUCTION, MANDIBLE
Anesthesia: Choice | Laterality: Left

## 2022-01-07 NOTE — Progress Notes (Signed)
Called patient to give pre-op instructions and patient stated she was unable to make arrangements for her children and would have to cancel.  Patient instructed to contact Dr. Anabel Halon office to reschedule.  OR and Dr. Kenney Houseman notified.

## 2022-01-13 ENCOUNTER — Ambulatory Visit: Payer: Self-pay | Admitting: Oral Surgery

## 2022-01-16 ENCOUNTER — Encounter (HOSPITAL_COMMUNITY): Payer: Self-pay | Admitting: Oral Surgery

## 2022-01-16 NOTE — Pre-Procedure Instructions (Signed)
PCP - n/a Cardiologist - n/a EKG - n/a Chest x-ray - n/a ECHO - n/a Cardiac Cath - n/a CPAP - n/a  Blood Thinner Instructions: n/a Aspirin Instructions: n/a ERAS Protcol - yes Clears until 1330 COVID TEST- n/a  Anesthesia review: no  -------------  SDW INSTRUCTIONS:  Your procedure is scheduled on 01/21/22. Please report to [facility] Main Entrance "A" at 1400 A.M., and check in at the Admitting office. Call this number if you have problems the morning of surgery: [phone]   Remember: Do not eat  after midnight the night before your surgery  You may drink clear liquids until 1330 the morning of your surgery.   Clear liquids allowed are: Water, Non-Citrus Juices (without pulp), Carbonated Beverages, Clear Tea, Black Coffee Only, and Gatorade   Medications to take morning of surgery with a sip of water include: N/a  As of today, STOP taking any Aspirin (unless otherwise instructed by your surgeon), Aleve, Naproxen, Ibuprofen, Motrin, Advil, Goody's, BC's, all herbal medications, fish oil, and all vitamins.    The Morning of Surgery Do not wear jewelry, make-up or nail polish. Do not wear lotions, powders, or perfumes/colognes, or deodorant Do not bring valuables to the hospital. [facility] is not responsible for any belongings or valuables.  If you are a smoker, DO NOT Smoke 24 hours prior to surgery  If you wear a CPAP at night please bring your mask the morning of surgery   Remember that you must have someone to transport you home after your surgery, and remain with you for 24 hours if you are discharged the same day.  Please bring cases for contacts, glasses, hearing aids, dentures or bridgework because it cannot be worn into surgery.   Patients discharged the day of surgery will not be allowed to drive home.   Please shower the NIGHT BEFORE/MORNING OF SURGERY (use antibacterial soap like DIAL soap if possible). Wear comfortable clothes the morning of surgery.  Oral Hygiene is also important to reduce your risk of infection.  Remember - BRUSH YOUR TEETH THE MORNING OF SURGERY WITH YOUR REGULAR TOOTHPASTE  Patient denies shortness of breath, fever, cough and chest pain.

## 2022-01-21 ENCOUNTER — Encounter (HOSPITAL_COMMUNITY)
Admission: RE | Disposition: A | Discharge status: Home / Self Care | Payer: Self-pay | Source: Home / Self Care | Attending: Oral Surgery

## 2022-01-21 ENCOUNTER — Ambulatory Visit (HOSPITAL_COMMUNITY)
Admission: RE | Admit: 2022-01-21 | Discharge: 2022-01-21 | Disposition: A | Discharge status: Home / Self Care | Payer: BC Managed Care – PPO | Attending: Oral Surgery | Admitting: Oral Surgery

## 2022-01-21 ENCOUNTER — Encounter (HOSPITAL_COMMUNITY): Payer: Self-pay | Admitting: Certified Registered Nurse Anesthetist

## 2022-01-21 ENCOUNTER — Encounter (HOSPITAL_COMMUNITY): Payer: Self-pay | Admitting: Oral Surgery

## 2022-01-21 DIAGNOSIS — Z3201 Encounter for pregnancy test, result positive: Secondary | ICD-10-CM | POA: Diagnosis not present

## 2022-01-21 LAB — CBC
HCT: 35.2 % — ABNORMAL LOW (ref 36.0–46.0)
Hemoglobin: 11 g/dL — ABNORMAL LOW (ref 12.0–15.0)
MCH: 24.7 pg — ABNORMAL LOW (ref 26.0–34.0)
MCHC: 31.3 g/dL (ref 30.0–36.0)
MCV: 79.1 fL — ABNORMAL LOW (ref 80.0–100.0)
Platelets: 309 10*3/uL (ref 150–400)
RBC: 4.45 MIL/uL (ref 3.87–5.11)
RDW: 12.6 % (ref 11.5–15.5)
WBC: 6.3 10*3/uL (ref 4.0–10.5)
nRBC: 0 % (ref 0.0–0.2)

## 2022-01-21 LAB — POCT PREGNANCY, URINE: Preg Test, Ur: POSITIVE — AB

## 2022-01-21 LAB — HCG, SERUM, QUALITATIVE: Preg, Serum: POSITIVE — AB

## 2022-01-21 SURGERY — CLOSED REDUCTION, MANDIBLE
Anesthesia: Choice | Laterality: Left

## 2022-01-21 MED ORDER — LACTATED RINGERS IV SOLN: INTRAVENOUS | Status: DC

## 2022-01-21 MED ORDER — ORAL CARE MOUTH RINSE: 15.0000 mL | Freq: Once | OROMUCOSAL | Status: AC

## 2022-01-21 MED ORDER — PROPOFOL 10 MG/ML IV BOLUS
INTRAVENOUS | Status: AC
  Filled 2022-01-21: qty 20

## 2022-01-21 MED ORDER — MIDAZOLAM HCL 2 MG/2ML IJ SOLN
INTRAMUSCULAR | Status: AC
  Filled 2022-01-21: qty 2

## 2022-01-21 MED ORDER — CHLORHEXIDINE GLUCONATE 0.12 % MT SOLN
15.0000 mL | Freq: Once | OROMUCOSAL | Status: AC
  Administered 2022-01-21: 15 mL via OROMUCOSAL
  Filled 2022-01-21: qty 15

## 2022-01-21 MED ORDER — FENTANYL CITRATE (PF) 250 MCG/5ML IJ SOLN
INTRAMUSCULAR | Status: AC
  Filled 2022-01-21: qty 5

## 2022-01-21 NOTE — Progress Notes (Addendum)
POCT Urine HCG positive at preop, pt made aware. Serum HCG obtained and sent to lab. Dr. Maple Hudson and Dr. Kenney Houseman aware, awaiting results.

## 2022-01-21 NOTE — Progress Notes (Signed)
Surgery cancelled per Dr. Kenney Houseman and Dr. Maple Hudson due to new onset pregnancy. IV removed. Pt discharged home.

## 2022-01-23 DIAGNOSIS — Z419 Encounter for procedure for purposes other than remedying health state, unspecified: Secondary | ICD-10-CM | POA: Diagnosis not present

## 2022-01-27 ENCOUNTER — Other Ambulatory Visit: Payer: Self-pay

## 2022-01-27 ENCOUNTER — Encounter (HOSPITAL_COMMUNITY): Payer: Self-pay | Admitting: Oral Surgery
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
## 2022-01-28 ENCOUNTER — Encounter (HOSPITAL_COMMUNITY)
Admission: RE | Disposition: A | Discharge status: Home / Self Care | Payer: Self-pay | Source: Home / Self Care | Attending: Oral Surgery

## 2022-01-28 ENCOUNTER — Ambulatory Visit (HOSPITAL_COMMUNITY)
Admission: RE | Admit: 2022-01-28 | Discharge: 2022-01-28 | Disposition: A | Discharge status: Home / Self Care | Payer: BC Managed Care – PPO | Attending: Oral Surgery | Admitting: Oral Surgery

## 2022-01-28 ENCOUNTER — Ambulatory Visit (HOSPITAL_COMMUNITY): Payer: BC Managed Care – PPO | Admitting: Anesthesiology

## 2022-01-28 ENCOUNTER — Encounter (HOSPITAL_COMMUNITY): Payer: Self-pay | Admitting: Oral Surgery

## 2022-01-28 ENCOUNTER — Other Ambulatory Visit: Payer: Self-pay

## 2022-01-28 DIAGNOSIS — K029 Dental caries, unspecified: Secondary | ICD-10-CM | POA: Insufficient documentation

## 2022-01-28 DIAGNOSIS — O9A211 Injury, poisoning and certain other consequences of external causes complicating pregnancy, first trimester: Secondary | ICD-10-CM | POA: Insufficient documentation

## 2022-01-28 DIAGNOSIS — Z3A01 Less than 8 weeks gestation of pregnancy: Secondary | ICD-10-CM | POA: Insufficient documentation

## 2022-01-28 DIAGNOSIS — S02612A Fracture of condylar process of left mandible, initial encounter for closed fracture: Secondary | ICD-10-CM | POA: Insufficient documentation

## 2022-01-28 HISTORY — PX: CLOSED REDUCTION MANDIBLE: SHX5307

## 2022-01-28 SURGERY — CLOSED REDUCTION, MANDIBLE
Anesthesia: General | Site: Mouth | Laterality: Left

## 2022-01-28 MED ORDER — LIDOCAINE-EPINEPHRINE 1 %-1:100000 IJ SOLN
INTRAMUSCULAR | Status: DC | PRN
  Administered 2022-01-28: 20 mL via INTRADERMAL

## 2022-01-28 MED ORDER — ROCURONIUM BROMIDE 10 MG/ML (PF) SYRINGE
PREFILLED_SYRINGE | INTRAVENOUS | Status: AC
  Filled 2022-01-28: qty 10

## 2022-01-28 MED ORDER — CHLORHEXIDINE GLUCONATE 0.12 % MT SOLN: 15.0000 mL | Freq: Once | OROMUCOSAL | Status: AC

## 2022-01-28 MED ORDER — LIDOCAINE-EPINEPHRINE 2 %-1:100000 IJ SOLN
INTRAMUSCULAR | Status: AC
  Filled 2022-01-28: qty 1

## 2022-01-28 MED ORDER — DEXAMETHASONE SODIUM PHOSPHATE 10 MG/ML IJ SOLN
INTRAMUSCULAR | Status: AC
  Filled 2022-01-28: qty 1

## 2022-01-28 MED ORDER — DEXAMETHASONE SODIUM PHOSPHATE 10 MG/ML IJ SOLN
INTRAMUSCULAR | Status: DC | PRN
  Administered 2022-01-28: 5 mg via INTRAVENOUS

## 2022-01-28 MED ORDER — LACTATED RINGERS IV SOLN: INTRAVENOUS | Status: DC

## 2022-01-28 MED ORDER — CHLORHEXIDINE GLUCONATE 0.12 % MT SOLN
OROMUCOSAL | Status: AC
  Administered 2022-01-28: 15 mL via OROMUCOSAL
  Filled 2022-01-28: qty 15

## 2022-01-28 MED ORDER — ONDANSETRON HCL 4 MG/2ML IJ SOLN: 4.0000 mg | Freq: Once | INTRAMUSCULAR | Status: DC | PRN

## 2022-01-28 MED ORDER — FENTANYL CITRATE (PF) 100 MCG/2ML IJ SOLN
25.0000 ug | INTRAMUSCULAR | Status: DC | PRN
  Administered 2022-01-28 (×3): 50 ug via INTRAVENOUS

## 2022-01-28 MED ORDER — FENTANYL CITRATE (PF) 250 MCG/5ML IJ SOLN
INTRAMUSCULAR | Status: AC
  Filled 2022-01-28: qty 5

## 2022-01-28 MED ORDER — SUGAMMADEX SODIUM 200 MG/2ML IV SOLN
INTRAVENOUS | Status: DC | PRN
  Administered 2022-01-28: 200 mg via INTRAVENOUS

## 2022-01-28 MED ORDER — CHLORHEXIDINE GLUCONATE 0.12 % MT SOLN: 15.0000 mL | Freq: Three times a day (TID) | OROMUCOSAL | 5 refills | Status: DC

## 2022-01-28 MED ORDER — PROPOFOL 1000 MG/100ML IV EMUL
INTRAVENOUS | Status: AC
  Filled 2022-01-28: qty 100

## 2022-01-28 MED ORDER — ACETAMINOPHEN 10 MG/ML IV SOLN
INTRAVENOUS | Status: DC | PRN
  Administered 2022-01-28: 1000 mg via INTRAVENOUS

## 2022-01-28 MED ORDER — FENTANYL CITRATE (PF) 100 MCG/2ML IJ SOLN
INTRAMUSCULAR | Status: AC
  Filled 2022-01-28: qty 2

## 2022-01-28 MED ORDER — ONDANSETRON HCL 4 MG/2ML IJ SOLN
INTRAMUSCULAR | Status: AC
  Filled 2022-01-28: qty 2

## 2022-01-28 MED ORDER — LIDOCAINE 2% (20 MG/ML) 5 ML SYRINGE
INTRAMUSCULAR | Status: AC
  Filled 2022-01-28: qty 5

## 2022-01-28 MED ORDER — IBUPROFEN 100 MG/5ML PO SUSP: 400.0000 mg | ORAL | 3 refills | Status: AC | PRN

## 2022-01-28 MED ORDER — 0.9 % SODIUM CHLORIDE (POUR BTL) OPTIME
TOPICAL | Status: DC | PRN
  Administered 2022-01-28: 1000 mL

## 2022-01-28 MED ORDER — PROPOFOL 500 MG/50ML IV EMUL
INTRAVENOUS | Status: DC | PRN
  Administered 2022-01-28: 150 ug/kg/min via INTRAVENOUS
  Administered 2022-01-28: 175 ug/kg/min via INTRAVENOUS

## 2022-01-28 MED ORDER — PROPOFOL 10 MG/ML IV BOLUS
INTRAVENOUS | Status: DC | PRN
  Administered 2022-01-28: 30 mg via INTRAVENOUS
  Administered 2022-01-28: 170 mg via INTRAVENOUS

## 2022-01-28 MED ORDER — ORAL CARE MOUTH RINSE: 15.0000 mL | Freq: Once | OROMUCOSAL | Status: AC

## 2022-01-28 MED ORDER — FENTANYL CITRATE (PF) 100 MCG/2ML IJ SOLN
INTRAMUSCULAR | Status: DC | PRN
  Administered 2022-01-28 (×2): 50 ug via INTRAVENOUS
  Administered 2022-01-28: 100 ug via INTRAVENOUS
  Administered 2022-01-28: 50 ug via INTRAVENOUS

## 2022-01-28 MED ORDER — OXYMETAZOLINE HCL 0.05 % NA SOLN
NASAL | Status: AC
  Filled 2022-01-28: qty 30

## 2022-01-28 MED ORDER — AMOXICILLIN 250 MG/5ML PO SUSR: 500.0000 mg | Freq: Three times a day (TID) | ORAL | 0 refills | Status: AC

## 2022-01-28 MED ORDER — CEFAZOLIN SODIUM-DEXTROSE 2-3 GM-%(50ML) IV SOLR
INTRAVENOUS | Status: DC | PRN
  Administered 2022-01-28: 2 g via INTRAVENOUS

## 2022-01-28 MED ORDER — ACETAMINOPHEN 10 MG/ML IV SOLN
INTRAVENOUS | Status: AC
  Filled 2022-01-28: qty 100

## 2022-01-28 MED ORDER — LIDOCAINE 2% (20 MG/ML) 5 ML SYRINGE
INTRAMUSCULAR | Status: DC | PRN
  Administered 2022-01-28: 80 mg via INTRAVENOUS

## 2022-01-28 MED ORDER — ONDANSETRON HCL 4 MG/2ML IJ SOLN
INTRAMUSCULAR | Status: DC | PRN
  Administered 2022-01-28: 4 mg via INTRAVENOUS

## 2022-01-28 MED ORDER — ROCURONIUM BROMIDE 10 MG/ML (PF) SYRINGE
PREFILLED_SYRINGE | INTRAVENOUS | Status: DC | PRN
  Administered 2022-01-28: 50 mg via INTRAVENOUS
  Administered 2022-01-28: 20 mg via INTRAVENOUS

## 2022-01-28 MED ORDER — CEFAZOLIN SODIUM 1 G IJ SOLR
INTRAMUSCULAR | Status: AC
  Filled 2022-01-28: qty 20

## 2022-01-28 MED ORDER — PROPOFOL 10 MG/ML IV BOLUS
INTRAVENOUS | Status: AC
  Filled 2022-01-28: qty 20

## 2022-01-28 SURGICAL SUPPLY — 33 items
BAG COUNTER SPONGE SURGICOUNT (BAG) ×1 IMPLANT
BLADE SURG 15 STRL LF DISP TIS (BLADE) IMPLANT
BLADE SURG 15 STRL SS (BLADE) ×1
CANISTER SUCT 3000ML PPV (MISCELLANEOUS) ×1 IMPLANT
COVER SURGICAL LIGHT HANDLE (MISCELLANEOUS) ×1 IMPLANT
DRAPE HALF SHEET 40X57 (DRAPES) ×1 IMPLANT
GAUZE 4X4 16PLY ~~LOC~~+RFID DBL (SPONGE) IMPLANT
GLOVE BIO SURGEON STRL SZ 6.5 (GLOVE) ×2 IMPLANT
GLOVE ORTHO TXT STRL SZ7.5 (GLOVE) ×1 IMPLANT
GOWN STRL REUS W/ TWL LRG LVL3 (GOWN DISPOSABLE) ×2 IMPLANT
GOWN STRL REUS W/TWL LRG LVL3 (GOWN DISPOSABLE) ×2
KIT BASIN OR (CUSTOM PROCEDURE TRAY) ×1 IMPLANT
KIT TURNOVER KIT B (KITS) ×1 IMPLANT
NDL HYPO 25GX1X1/2 BEV (NEEDLE) IMPLANT
NDL PRECISIONGLIDE 27X1.5 (NEEDLE) ×1 IMPLANT
NEEDLE HYPO 25GX1X1/2 BEV (NEEDLE) ×2 IMPLANT
NEEDLE PRECISIONGLIDE 27X1.5 (NEEDLE) ×1 IMPLANT
NS IRRIG 1000ML POUR BTL (IV SOLUTION) ×1 IMPLANT
PAD ARMBOARD 7.5X6 YLW CONV (MISCELLANEOUS) ×2 IMPLANT
PLATE HYBRID MMF SM (Plate) IMPLANT
SCISSORS WIRE DISP (INSTRUMENTS) IMPLANT
SCREW LOCK SELFDRIL 2.0X8M MMF (Screw) IMPLANT
SCREW LOCKING SELF DRILL 2.0X6 (Screw) IMPLANT
SPIKE FLUID TRANSFER (MISCELLANEOUS) ×1 IMPLANT
SUCTION FRAZIER HANDLE 10FR (MISCELLANEOUS)
SUCTION TUBE FRAZIER 10FR DISP (MISCELLANEOUS) IMPLANT
SUT CHROMIC 3 0 PS 2 (SUTURE) ×1 IMPLANT
SUT CHROMIC 4 0 PS 2 18 (SUTURE) ×1 IMPLANT
SYR CONTROL 10ML LL (SYRINGE) ×1 IMPLANT
TRAY ENT MC OR (CUSTOM PROCEDURE TRAY) ×1 IMPLANT
TUBE CONNECTING 12X1/4 (SUCTIONS) ×1 IMPLANT
WATER STERILE IRR 1000ML POUR (IV SOLUTION) ×1 IMPLANT
YANKAUER SUCT BULB TIP NO VENT (SUCTIONS) ×1 IMPLANT

## 2022-01-28 NOTE — Brief Op Note (Signed)
01/28/2022  2:53 PM  PATIENT:  Anita Robinson  26 y.o. female  PRE-OPERATIVE DIAGNOSIS:  Displaced Left Condylar Fracture  POST-OPERATIVE DIAGNOSIS:  Displaced Left Condylar Fracture  PROCEDURE:  Procedure(s): CLOSED REDUCTION OF LEFT CONDYLOID FRACTURE WITH FIXATION (Left)  SURGEON:  Surgeon(s) and Role:    * Tacy Chavis, Jill Alexanders, DMD - Primary  ASSISTANTS: Hadassah Pais  ANESTHESIA:   general  EBL:  <5cc  BLOOD ADMINISTERED:none  DRAINS: none   LOCAL MEDICATIONS USED:  LIDOCAINE   SPECIMEN:  No Specimen  DISPOSITION OF SPECIMEN:  N/A  COUNTS:  YES  TOURNIQUET:  * No tourniquets in log *  DICTATION: .Dragon Dictation  PLAN OF CARE: Discharge to home after PACU  PATIENT DISPOSITION:  PACU - hemodynamically stable.   Delay start of Pharmacological VTE agent (>24hrs) due to surgical blood loss or risk of bleeding: not applicable
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
## 2022-01-28 NOTE — Anesthesia Procedure Notes (Signed)
Procedure Name: Intubation Date/Time: 01/28/2022 1:56 PM  Performed by: De Nurse, CRNAPre-anesthesia Checklist: Patient identified, Emergency Drugs available, Suction available and Patient being monitored Patient Re-evaluated:Patient Re-evaluated prior to induction Oxygen Delivery Method: Circle System Utilized Preoxygenation: Pre-oxygenation with 100% oxygen Induction Type: IV induction Ventilation: Mask ventilation without difficulty Laryngoscope Size: Glidescope and 4 Grade View: Grade I Nasal Tubes: Nasal Rae, Nasal prep performed, Left and Magill forceps- large, utilized Tube size: 7.0 mm Number of attempts: 1 Placement Confirmation: ETT inserted through vocal cords under direct vision, positive ETCO2 and breath sounds checked- equal and bilateral Secured at: 25 cm Tube secured with: Tape Dental Injury: Teeth and Oropharynx as per pre-operative assessment

## 2022-01-28 NOTE — Discharge Instructions (Signed)
Full liquid diet as tolerated. Recommend Ensure/Boost high protein supplements. May brush arch bars/wires along with outer surfaces of teeth. Use Chlorhexidine rinses three times daily. Rinse for 1:30-29minutes.

## 2022-01-28 NOTE — Anesthesia Postprocedure Evaluation (Signed)
Anesthesia Post Note  Patient: Anita Robinson  Procedure(s) Performed: CLOSED REDUCTION OF LEFT CONDYLOID FRACTURE WITH FIXATION (Left: Mouth)     Patient location during evaluation: PACU Anesthesia Type: General Level of consciousness: awake and alert Pain management: pain level controlled Vital Signs Assessment: post-procedure vital signs reviewed and stable Respiratory status: spontaneous breathing, nonlabored ventilation, respiratory function stable and patient connected to nasal cannula oxygen Cardiovascular status: blood pressure returned to baseline and stable Postop Assessment: no apparent nausea or vomiting Anesthetic complications: no   No notable events documented.  Last Vitals:  Vitals:   01/28/22 1503 01/28/22 1515  BP: (!) 148/99 129/82  Pulse: (!) 101 72  Resp: 14 16  Temp: 37.3 C   SpO2: 100% 97%    Last Pain:  Vitals:   01/28/22 1515  TempSrc:   PainSc: 8                  Collene Schlichter

## 2022-01-28 NOTE — Anesthesia Preprocedure Evaluation (Addendum)
Anesthesia Evaluation  Patient identified by MRN, date of birth, ID band Patient awake    Reviewed: Allergy & Precautions, NPO status , Patient's Chart, lab work & pertinent test results  Airway Mallampati: II  TM Distance: >3 FB Neck ROM: Full  Mouth opening: Limited Mouth Opening  Dental  (+) Teeth Intact, Dental Advisory Given   Pulmonary neg pulmonary ROS   Pulmonary exam normal breath sounds clear to auscultation       Cardiovascular negative cardio ROS Normal cardiovascular exam Rhythm:Regular Rate:Normal     Neuro/Psych negative neurological ROS  negative psych ROS   GI/Hepatic negative GI ROS, Neg liver ROS,,,  Endo/Other  negative endocrine ROS    Renal/GU negative Renal ROS     Musculoskeletal Displaced Left Condylar Fracture   Abdominal   Peds  Hematology  (+) Blood dyscrasia, anemia   Anesthesia Other Findings Day of surgery medications reviewed with the patient.  Reproductive/Obstetrics (+) Pregnancy (5 weeks)                             Anesthesia Physical Anesthesia Plan  ASA: 2  Anesthesia Plan: General   Post-op Pain Management: Ofirmev IV (intra-op)*   Induction: Intravenous  PONV Risk Score and Plan: 3 and Dexamethasone, Ondansetron and TIVA  Airway Management Planned: Nasal ETT  Additional Equipment:   Intra-op Plan:   Post-operative Plan: Extubation in OR  Informed Consent: I have reviewed the patients History and Physical, chart, labs and discussed the procedure including the risks, benefits and alternatives for the proposed anesthesia with the patient or authorized representative who has indicated his/her understanding and acceptance.     Dental advisory given  Plan Discussed with: CRNA  Anesthesia Plan Comments:        Anesthesia Quick Evaluation

## 2022-01-28 NOTE — Transfer of Care (Signed)
Immediate Anesthesia Transfer of Care Note  Patient: Anita Robinson  Procedure(s) Performed: CLOSED REDUCTION OF LEFT CONDYLOID FRACTURE WITH FIXATION (Left: Mouth)  Patient Location: PACU  Anesthesia Type:General  Level of Consciousness: awake and oriented  Airway & Oxygen Therapy: Patient Spontanous Breathing  Post-op Assessment: Report given to RN  Post vital signs: Reviewed and stable  Last Vitals:  Vitals Value Taken Time  BP 148/99 01/28/22 1503  Temp    Pulse 111 01/28/22 1504  Resp 11 01/28/22 1504  SpO2 98 % 01/28/22 1504  Vitals shown include unvalidated device data.  Last Pain:  Vitals:   01/28/22 1159  TempSrc:   PainSc: 0-No pain         Complications: No notable events documented.

## 2022-01-28 NOTE — Interval H&P Note (Signed)
History and Physical Interval Note:  01/28/2022 1:34 PM  Anita Robinson  has presented today for surgery, with the diagnosis of Displaced Left Condylar Fracture.  The various methods of treatment have been discussed with the patient and family. After consideration of risks, benefits and other options for treatment, the patient has consented to  Procedure(s): CLOSED REDUCTION OF LEFT CONDYLOID FRACTURE WITH FIXATION (Left) as a surgical intervention.  The patient's history has been reviewed, patient examined, no change in status, stable for surgery.  I have reviewed the patient's chart and labs.  Questions were answered to the patient's satisfaction.     Vivia Ewing, DMD Oral & Maxillofacial Surgery

## 2022-01-28 NOTE — Progress Notes (Signed)
Patient ID: Anita Robinson, female   DOB: 12-May-1995, 26 y.o.   MRN: [id number] Patient presents for repair of jaw fracture at less than [redacted] weeks gestation. No fetal monitoring is indicated and the case can proceed routinely as long as no contraindicated medications are given.  Adam Phenix, MD 01/28/2022

## 2022-02-05 ENCOUNTER — Encounter (HOSPITAL_COMMUNITY): Payer: Self-pay | Admitting: Oral Surgery

## 2022-02-11 ENCOUNTER — Encounter (HOSPITAL_COMMUNITY): Payer: Self-pay | Admitting: Oral Surgery

## 2022-02-23 DIAGNOSIS — Z419 Encounter for procedure for purposes other than remedying health state, unspecified: Secondary | ICD-10-CM | POA: Diagnosis not present

## 2022-03-26 DIAGNOSIS — Z419 Encounter for procedure for purposes other than remedying health state, unspecified: Secondary | ICD-10-CM | POA: Diagnosis not present

## 2022-04-24 DIAGNOSIS — Z419 Encounter for procedure for purposes other than remedying health state, unspecified: Secondary | ICD-10-CM | POA: Diagnosis not present

## 2022-04-27 ENCOUNTER — Encounter: Payer: Self-pay | Admitting: Emergency Medicine

## 2022-04-27 ENCOUNTER — Other Ambulatory Visit: Payer: Self-pay

## 2022-04-27 DIAGNOSIS — O99281 Endocrine, nutritional and metabolic diseases complicating pregnancy, first trimester: Secondary | ICD-10-CM | POA: Diagnosis not present

## 2022-04-27 DIAGNOSIS — O209 Hemorrhage in early pregnancy, unspecified: Secondary | ICD-10-CM | POA: Diagnosis not present

## 2022-04-27 DIAGNOSIS — E876 Hypokalemia: Secondary | ICD-10-CM | POA: Insufficient documentation

## 2022-04-27 DIAGNOSIS — D259 Leiomyoma of uterus, unspecified: Secondary | ICD-10-CM | POA: Diagnosis not present

## 2022-04-27 DIAGNOSIS — Z202 Contact with and (suspected) exposure to infections with a predominantly sexual mode of transmission: Secondary | ICD-10-CM | POA: Diagnosis not present

## 2022-04-27 DIAGNOSIS — N939 Abnormal uterine and vaginal bleeding, unspecified: Secondary | ICD-10-CM | POA: Insufficient documentation

## 2022-04-27 DIAGNOSIS — Z3A01 Less than 8 weeks gestation of pregnancy: Secondary | ICD-10-CM | POA: Diagnosis not present

## 2022-04-27 LAB — CBC WITH DIFFERENTIAL/PLATELET
Abs Immature Granulocytes: 0.02 10*3/uL (ref 0.00–0.07)
Basophils Absolute: 0 10*3/uL (ref 0.0–0.1)
Basophils Relative: 0 %
Eosinophils Absolute: 0.1 10*3/uL (ref 0.0–0.5)
Eosinophils Relative: 1 %
HCT: 35.2 % — ABNORMAL LOW (ref 36.0–46.0)
Hemoglobin: 10.4 g/dL — ABNORMAL LOW (ref 12.0–15.0)
Immature Granulocytes: 0 %
Lymphocytes Relative: 24 %
Lymphs Abs: 1.6 10*3/uL (ref 0.7–4.0)
MCH: 23.2 pg — ABNORMAL LOW (ref 26.0–34.0)
MCHC: 29.5 g/dL — ABNORMAL LOW (ref 30.0–36.0)
MCV: 78.6 fL — ABNORMAL LOW (ref 80.0–100.0)
Monocytes Absolute: 0.4 10*3/uL (ref 0.1–1.0)
Monocytes Relative: 7 %
Neutro Abs: 4.4 10*3/uL (ref 1.7–7.7)
Neutrophils Relative %: 68 %
Platelets: 298 10*3/uL (ref 150–400)
RBC: 4.48 MIL/uL (ref 3.87–5.11)
RDW: 14.7 % (ref 11.5–15.5)
WBC: 6.5 10*3/uL (ref 4.0–10.5)
nRBC: 0 % (ref 0.0–0.2)

## 2022-04-27 LAB — URINALYSIS, ROUTINE W REFLEX MICROSCOPIC
Bacteria, UA: NONE SEEN
Bilirubin Urine: NEGATIVE
Glucose, UA: NEGATIVE mg/dL
Ketones, ur: NEGATIVE mg/dL
Nitrite: NEGATIVE
Protein, ur: 30 mg/dL — AB
RBC / HPF: >50 RBC/hpf (ref 0–5)
Specific Gravity, Urine: 1.019 (ref 1.005–1.030)
pH: 6 (ref 5.0–8.0)

## 2022-04-27 LAB — POC URINE PREG, ED: Preg Test, Ur: NEGATIVE
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
## 2022-04-28 ENCOUNTER — Emergency Department
Admission: EM | Admit: 2022-04-28 | Discharge: 2022-04-28 | Disposition: A | Discharge status: Home / Self Care | Payer: Medicaid Other | Attending: Emergency Medicine | Admitting: Emergency Medicine

## 2022-04-28 ENCOUNTER — Emergency Department: Payer: Medicaid Other

## 2022-04-28 DIAGNOSIS — E876 Hypokalemia: Secondary | ICD-10-CM

## 2022-04-28 DIAGNOSIS — D259 Leiomyoma of uterus, unspecified: Secondary | ICD-10-CM | POA: Diagnosis not present

## 2022-04-28 DIAGNOSIS — N939 Abnormal uterine and vaginal bleeding, unspecified: Secondary | ICD-10-CM

## 2022-04-28 DIAGNOSIS — Z202 Contact with and (suspected) exposure to infections with a predominantly sexual mode of transmission: Secondary | ICD-10-CM

## 2022-04-28 LAB — WET PREP, GENITAL
Clue Cells Wet Prep HPF POC: NONE SEEN
Sperm: NONE SEEN
Trich, Wet Prep: NONE SEEN
WBC, Wet Prep HPF POC: <10 (ref <10)
Yeast Wet Prep HPF POC: NONE SEEN

## 2022-04-28 LAB — COMPREHENSIVE METABOLIC PANEL
ALT: 14 U/L (ref 0–44)
AST: 17 U/L (ref 15–41)
Albumin: 3.9 g/dL (ref 3.5–5.0)
Alkaline Phosphatase: 46 U/L (ref 38–126)
Anion gap: 8 (ref 5–15)
BUN: 9 mg/dL (ref 6–20)
CO2: 25 mmol/L (ref 22–32)
Calcium: 8.9 mg/dL (ref 8.9–10.3)
Chloride: 102 mmol/L (ref 98–111)
Creatinine, Ser: 0.64 mg/dL (ref 0.44–1.00)
GFR, Estimated: >60 mL/min (ref >60)
Glucose, Bld: 104 mg/dL — ABNORMAL HIGH (ref 70–99)
Potassium: 3.2 mmol/L — ABNORMAL LOW (ref 3.5–5.1)
Sodium: 135 mmol/L (ref 135–145)
Total Bilirubin: 0.7 mg/dL (ref 0.3–1.2)
Total Protein: 7.4 g/dL (ref 6.5–8.1)

## 2022-04-28 LAB — HCG, QUANTITATIVE, PREGNANCY: hCG, Beta Chain, Quant, S: 2 m[IU]/mL (ref <5)

## 2022-04-28 LAB — CHLAMYDIA/NGC RT PCR (ARMC ONLY)
Chlamydia Tr: NOT DETECTED
N gonorrhoeae: NOT DETECTED

## 2022-04-28 MED ORDER — POTASSIUM CHLORIDE CRYS ER 20 MEQ PO TBCR
40.0000 meq | EXTENDED_RELEASE_TABLET | Freq: Once | ORAL | Status: AC
  Administered 2022-04-28: 40 meq via ORAL
  Filled 2022-04-28: qty 2

## 2022-04-28 MED ORDER — CEFTRIAXONE SODIUM 250 MG IJ SOLR
250.0000 mg | Freq: Once | INTRAMUSCULAR | Status: AC
  Administered 2022-04-28: 250 mg via INTRAMUSCULAR
  Filled 2022-04-28: qty 250

## 2022-04-28 MED ORDER — AZITHROMYCIN 1 G PO PACK
1.0000 g | PACK | Freq: Once | ORAL | Status: AC
  Administered 2022-04-28: 1 g via ORAL
  Filled 2022-04-28: qty 1

## 2022-04-28 MED ORDER — ONDANSETRON 4 MG PO TBDP
4.0000 mg | ORAL_TABLET | Freq: Once | ORAL | Status: AC
  Administered 2022-04-28: 4 mg via ORAL
  Filled 2022-04-28: qty 1
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
## 2022-04-28 NOTE — Discharge Instructions (Addendum)
You have been treated for STDs.  Please call the number provided to schedule a close follow-up appointment.  Return to the ER for worsening symptoms, persistent vomiting, difficulty breathing or other concerns.

## 2022-04-29 ENCOUNTER — Telehealth: Payer: Self-pay

## 2022-04-29 NOTE — Transitions of Care (Post Inpatient/ED Visit) (Signed)
   04/29/2022  Name: Anita Robinson MRN: [id number] DOB: 04/10/1995  Today's TOC FU Call Status: Today's TOC FU Call Status:: Unsuccessul Call (1st Attempt) Unsuccessful Call (1st Attempt) Date: 04/29/22  Attempted to reach the patient regarding the most recent Inpatient/ED visit.  Follow Up Plan: Additional outreach attempts will be made to reach the patient to complete the Transitions of Care (Post Inpatient/ED visit) call.   Mickel Fuchs, BSW, [place] Managed Medicaid Team  [phone]

## 2022-05-25 DIAGNOSIS — Z419 Encounter for procedure for purposes other than remedying health state, unspecified: Secondary | ICD-10-CM | POA: Diagnosis not present

## 2022-06-24 DIAGNOSIS — Z419 Encounter for procedure for purposes other than remedying health state, unspecified: Secondary | ICD-10-CM | POA: Diagnosis not present

## 2022-06-27 IMAGING — US US OB COMP LESS 14 WK
1 series · 15 of 27 positions shown · non-contrast
Comparison: None for this gestation

CLINICAL DATA: Abdominal pain in first trimester of pregnancy, LMP
11/13/2020; quantitative beta hCG not currently available for
comparison

EXAM:
OBSTETRIC <14 WK ULTRASOUND
TECHNIQUE: Transabdominal ultrasound was performed for evaluation of the
gestation as well as the maternal uterus and adnexal regions.

[Series 1: us ob comp less 14 wk · 27 acquisitions, 15 frames shown]
[im 1/27]
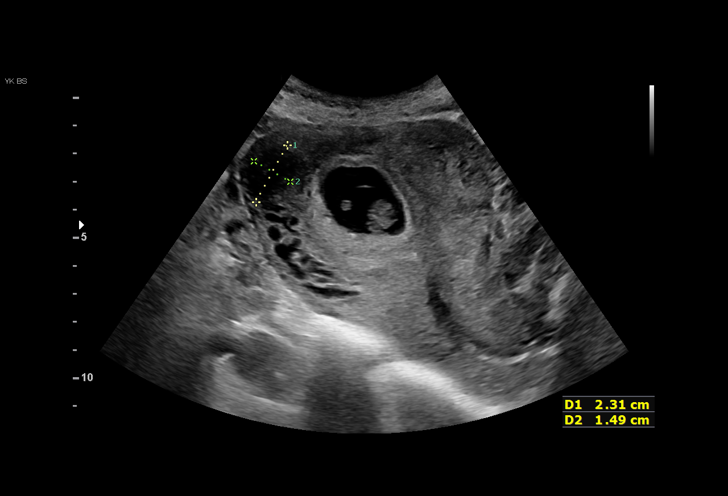
[im 3/27]
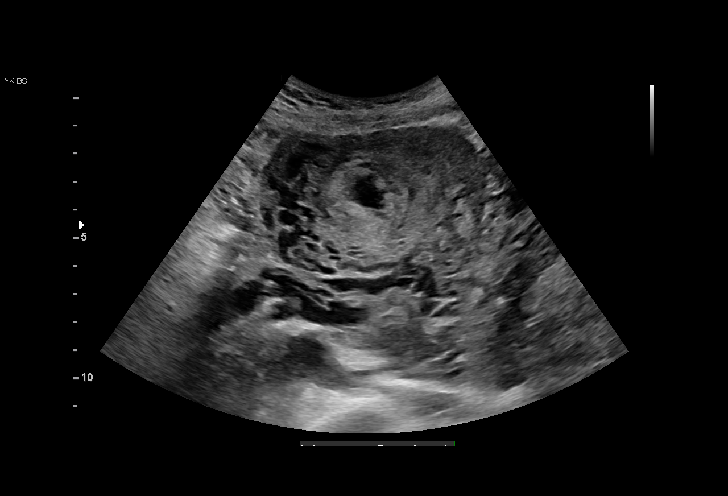
[im 5/27]
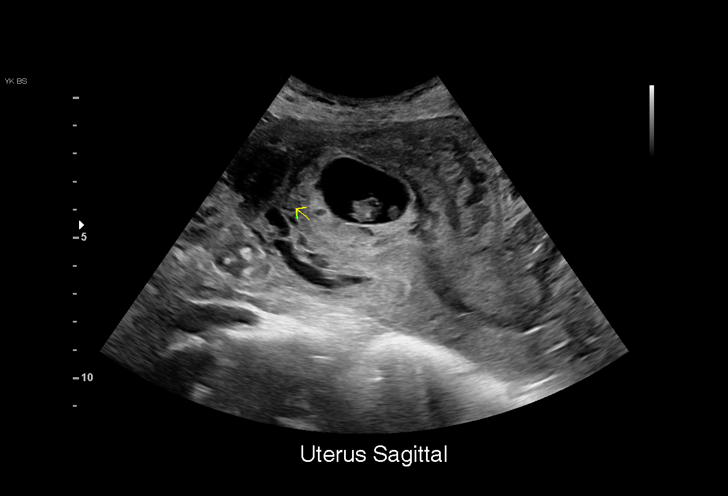
[im 7/27]
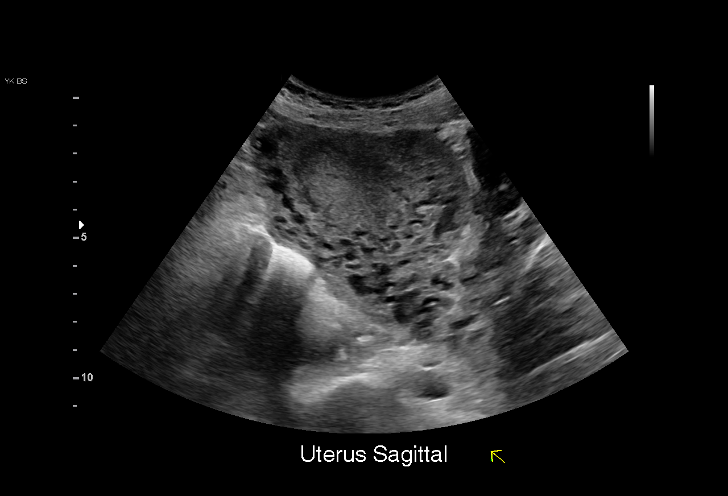
[im 9/27]
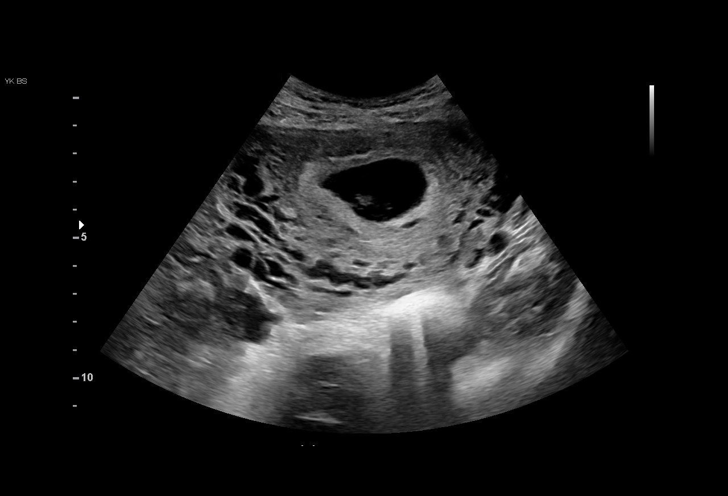
[im 10/27]
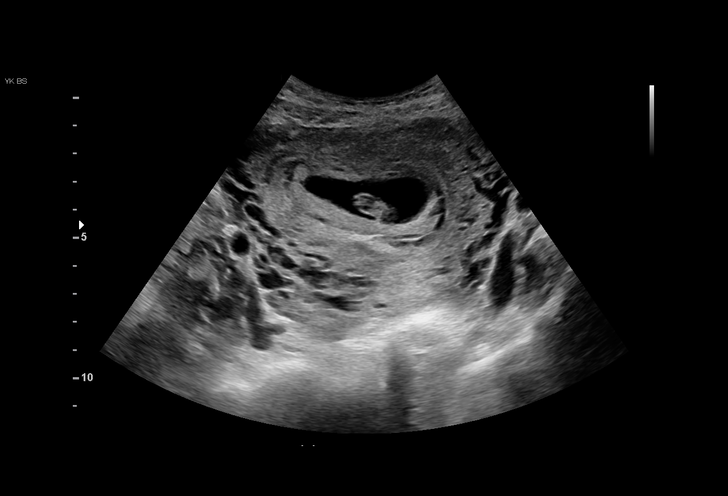
[im 12/27]
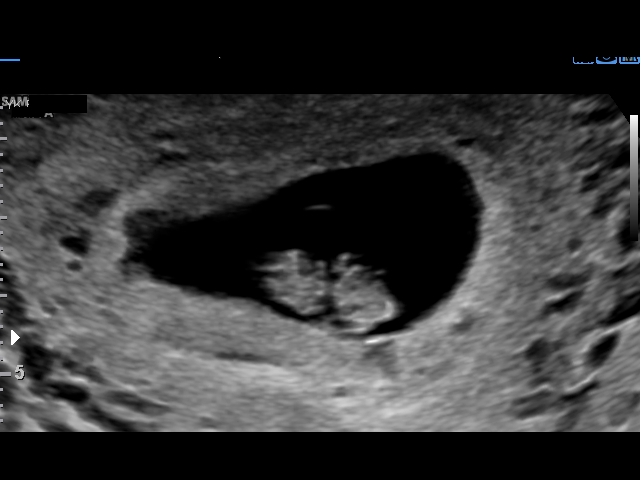
[im 14/27]
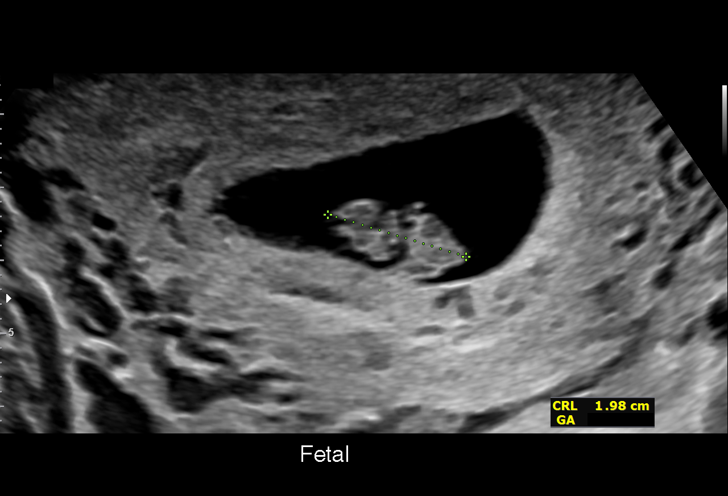
[im 16/27]
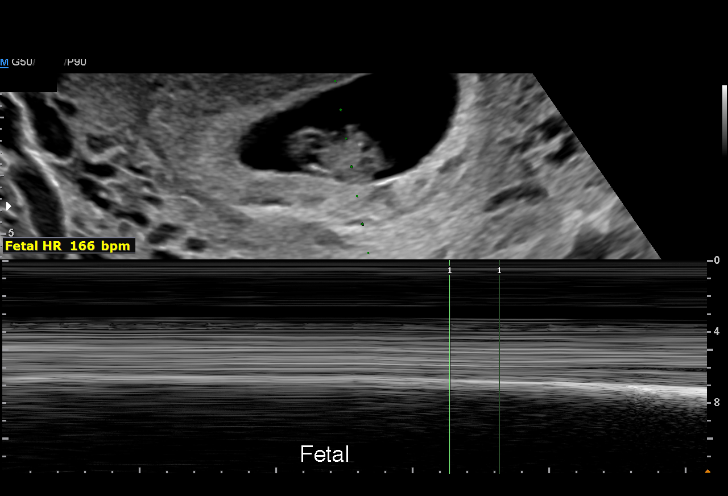
[im 18/27]
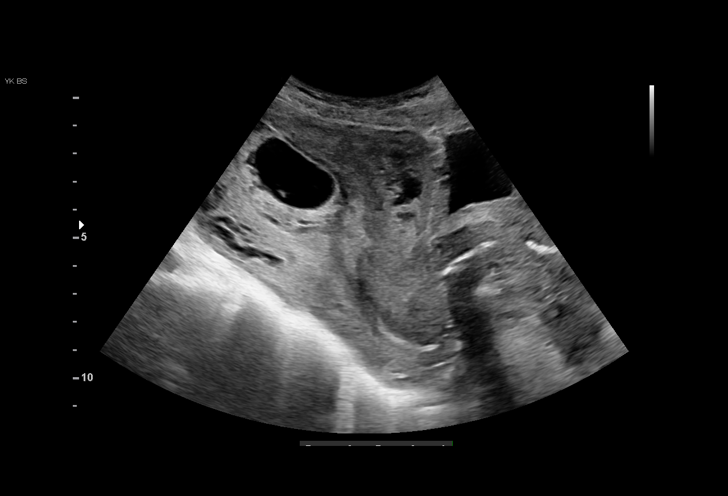
[im 19/27]
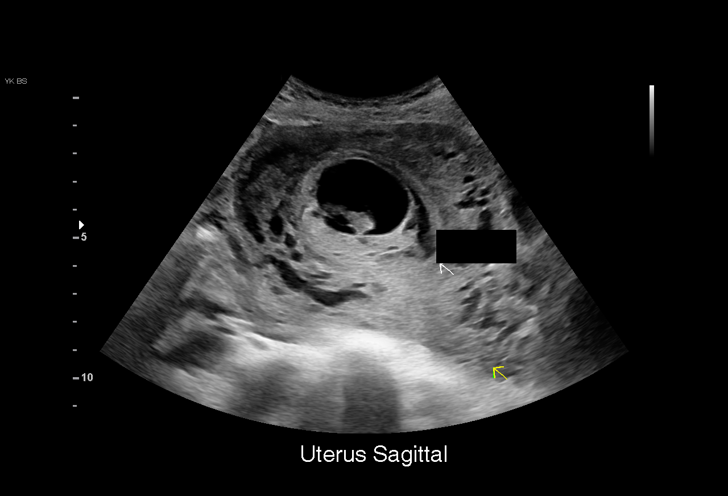
[im 21/27]
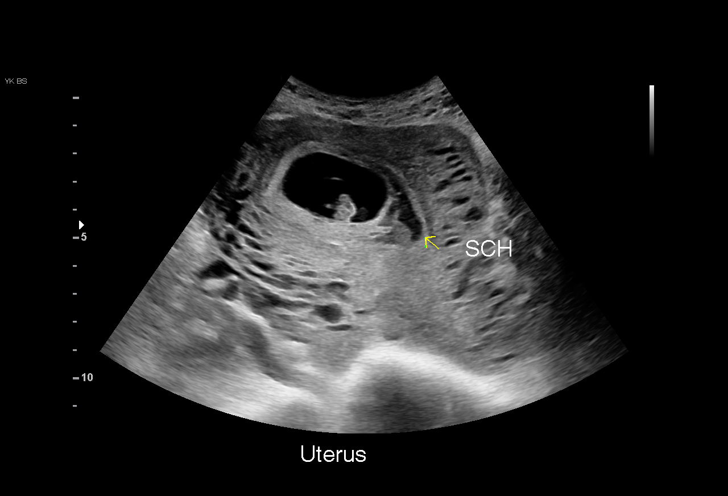
[im 23/27]
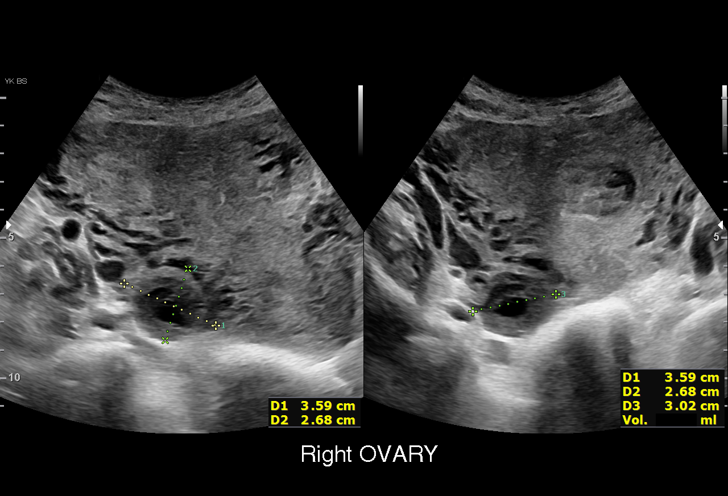
[im 25/27]
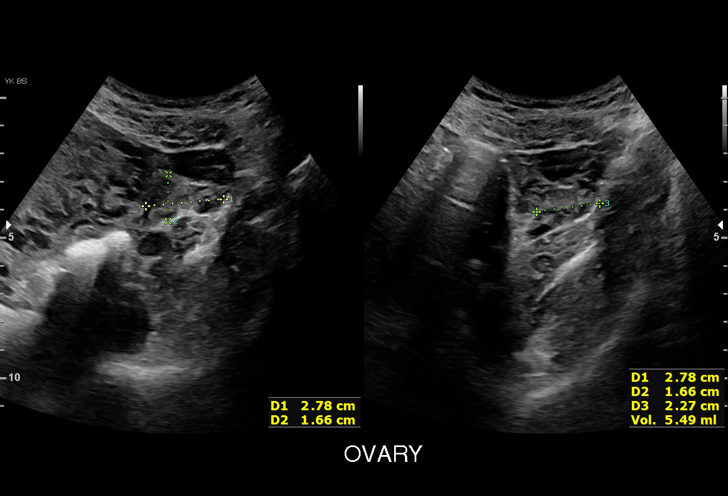
[im 27/27]
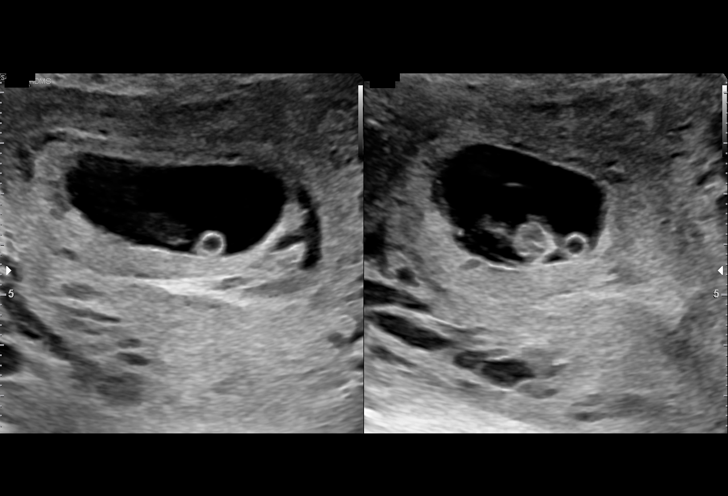

[15 of 27 positions shown; findings below may reference images not displayed]

FINDINGS: Intrauterine gestational sac: Present, single

Yolk sac:  Present

Embryo:  Present

Cardiac Activity: Present

Heart Rate: 162 bpm

CRL:   19.3 mm   8 w 3 d                  US EDC: 08/28/2021

Subchorionic hemorrhage:  Small subchronic hemorrhage

Maternal uterus/adnexae:

Small intramural leiomyoma at uterine fundus 3.0 x 2.6 x 2.3 cm.

Uterus otherwise unremarkable.

Small corpus luteum within RIGHT ovary, ovaries otherwise
unremarkable.

No free pelvic fluid or adnexal masses.
IMPRESSION: Single live intrauterine gestation at 8 weeks 3 days EGA by
crown-rump length.

Small subchronic hemorrhage.

## 2022-07-11 ENCOUNTER — Emergency Department
Admission: EM | Admit: 2022-07-11 | Discharge: 2022-07-11 | Disposition: A | Discharge status: Home / Self Care | Payer: Medicaid Other | Attending: Emergency Medicine | Admitting: Emergency Medicine

## 2022-07-11 ENCOUNTER — Other Ambulatory Visit: Payer: Self-pay

## 2022-07-11 ENCOUNTER — Emergency Department: Payer: Medicaid Other

## 2022-07-11 DIAGNOSIS — O23591 Infection of other part of genital tract in pregnancy, first trimester: Secondary | ICD-10-CM | POA: Diagnosis not present

## 2022-07-11 DIAGNOSIS — B9689 Other specified bacterial agents as the cause of diseases classified elsewhere: Secondary | ICD-10-CM | POA: Insufficient documentation

## 2022-07-11 DIAGNOSIS — Z3A01 Less than 8 weeks gestation of pregnancy: Secondary | ICD-10-CM | POA: Diagnosis not present

## 2022-07-11 DIAGNOSIS — O200 Threatened abortion: Secondary | ICD-10-CM | POA: Diagnosis not present

## 2022-07-11 DIAGNOSIS — O4690 Antepartum hemorrhage, unspecified, unspecified trimester: Secondary | ICD-10-CM

## 2022-07-11 DIAGNOSIS — O209 Hemorrhage in early pregnancy, unspecified: Secondary | ICD-10-CM | POA: Diagnosis not present

## 2022-07-11 LAB — COMPREHENSIVE METABOLIC PANEL
ALT: 20 U/L (ref 0–44)
AST: 21 U/L (ref 15–41)
Albumin: 4 g/dL (ref 3.5–5.0)
Alkaline Phosphatase: 47 U/L (ref 38–126)
Anion gap: 7 (ref 5–15)
BUN: 10 mg/dL (ref 6–20)
CO2: 25 mmol/L (ref 22–32)
Calcium: 8.8 mg/dL — ABNORMAL LOW (ref 8.9–10.3)
Chloride: 105 mmol/L (ref 98–111)
Creatinine, Ser: 0.69 mg/dL (ref 0.44–1.00)
GFR, Estimated: >60 mL/min (ref >60)
Glucose, Bld: 104 mg/dL — ABNORMAL HIGH (ref 70–99)
Potassium: 3.4 mmol/L — ABNORMAL LOW (ref 3.5–5.1)
Sodium: 137 mmol/L (ref 135–145)
Total Bilirubin: 0.4 mg/dL (ref 0.3–1.2)
Total Protein: 7.4 g/dL (ref 6.5–8.1)

## 2022-07-11 LAB — URINALYSIS, ROUTINE W REFLEX MICROSCOPIC
Bilirubin Urine: NEGATIVE
Glucose, UA: NEGATIVE mg/dL
Ketones, ur: NEGATIVE mg/dL
Nitrite: NEGATIVE
Protein, ur: 30 mg/dL — AB
RBC / HPF: >50 RBC/hpf (ref 0–5)
Specific Gravity, Urine: 1.023 (ref 1.005–1.030)
pH: 7 (ref 5.0–8.0)

## 2022-07-11 LAB — CBC WITH DIFFERENTIAL/PLATELET
Abs Immature Granulocytes: 0.01 10*3/uL (ref 0.00–0.07)
Basophils Absolute: 0 10*3/uL (ref 0.0–0.1)
Basophils Relative: 0 %
Eosinophils Absolute: 0.1 10*3/uL (ref 0.0–0.5)
Eosinophils Relative: 1 %
HCT: 36.2 % (ref 36.0–46.0)
Hemoglobin: 10.9 g/dL — ABNORMAL LOW (ref 12.0–15.0)
Immature Granulocytes: 0 %
Lymphocytes Relative: 39 %
Lymphs Abs: 1.9 10*3/uL (ref 0.7–4.0)
MCH: 23.7 pg — ABNORMAL LOW (ref 26.0–34.0)
MCHC: 30.1 g/dL (ref 30.0–36.0)
MCV: 78.9 fL — ABNORMAL LOW (ref 80.0–100.0)
Monocytes Absolute: 0.4 10*3/uL (ref 0.1–1.0)
Monocytes Relative: 8 %
Neutro Abs: 2.5 10*3/uL (ref 1.7–7.7)
Neutrophils Relative %: 52 %
Platelets: 274 10*3/uL (ref 150–400)
RBC: 4.59 MIL/uL (ref 3.87–5.11)
RDW: 13.7 % (ref 11.5–15.5)
WBC: 4.7 10*3/uL (ref 4.0–10.5)
nRBC: 0 % (ref 0.0–0.2)

## 2022-07-11 LAB — WET PREP, GENITAL
Sperm: NONE SEEN
Trich, Wet Prep: NONE SEEN
WBC, Wet Prep HPF POC: <10 (ref <10)
Yeast Wet Prep HPF POC: NONE SEEN

## 2022-07-11 LAB — CHLAMYDIA/NGC RT PCR (ARMC ONLY)
Chlamydia Tr: NOT DETECTED
N gonorrhoeae: NOT DETECTED

## 2022-07-11 LAB — HCG, QUANTITATIVE, PREGNANCY: hCG, Beta Chain, Quant, S: 3988 m[IU]/mL — ABNORMAL HIGH (ref <5)

## 2022-07-11 LAB — POC URINE PREG, ED: Preg Test, Ur: POSITIVE — AB

## 2022-07-11 LAB — ABO/RH: ABO/RH(D): O POS

## 2022-07-11 MED ORDER — METRONIDAZOLE 250 MG PO TABS: 250.0000 mg | ORAL_TABLET | Freq: Three times a day (TID) | ORAL | 0 refills | Status: AC

## 2022-07-11 NOTE — ED Provider Notes (Addendum)
[facility] Provider Note    Event Date/Time   First MD Initiated Contact with Patient 07/11/22 1342     (approximate)   History   Vaginal Bleeding and fishy discharge   HPI  Anita Robinson is a 27 y.o. female with 4 prior live births and 1 miscarriage who comes in with concerns for vaginal bleeding.  Patient reports her last menstrual period was on March 31.  She states that she had a little bit of whitish discharge that she was concerned could be BV given she is recurrent history of this.  She reports being with 1 partner with these prior pregnancies who is her husband and denies any concerns for STDs.  She states that she then developed this morning some bleeding that she reports is about that.  Denies it being more than a pad an hour but denies any syncope lightheadedness or other concerns.  Denies any significant abdominal pain just some mild cramping.      Physical Exam   Triage Vital Signs: ED Triage Vitals  Enc Vitals Group     BP 07/11/22 1330 116/80     Pulse Rate 07/11/22 1330 72     Resp 07/11/22 1330 18     Temp 07/11/22 1330 99.2 F (37.3 C)     Temp Source 07/11/22 1330 Oral     SpO2 07/11/22 1330 100 %     Weight 07/11/22 1330 158 lb (71.7 kg)     Height 07/11/22 1330 5\' 5"  (1.651 m)     Head Circumference --      Peak Flow --      Pain Score 07/11/22 1333 8     Pain Loc --      Pain Edu? --      Excl. in [place]? --     Most recent vital signs: Vitals:   07/11/22 1330  BP: 116/80  Pulse: 72  Resp: 18  Temp: 99.2 F (37.3 C)  SpO2: 100%     General: Awake, no distress.  CV:  Good peripheral perfusion.  Resp:  Normal effort.  Abd:  No distention.  Soft nontender Other:     ED Results / Procedures / Treatments   Labs (all labs ordered are listed, but only abnormal results are displayed) Labs Reviewed  WET PREP, GENITAL - Abnormal; Notable for the following components:      Result Value   Clue Cells Wet Prep HPF POC  PRESENT (*)    All other components within normal limits  CBC WITH DIFFERENTIAL/PLATELET - Abnormal; Notable for the following components:   Hemoglobin 10.9 (*)    MCV 78.9 (*)    MCH 23.7 (*)    All other components within normal limits  COMPREHENSIVE METABOLIC PANEL - Abnormal; Notable for the following components:   Potassium 3.4 (*)    Glucose, Bld 104 (*)    Calcium 8.8 (*)    All other components within normal limits  HCG, QUANTITATIVE, PREGNANCY - Abnormal; Notable for the following components:   hCG, Beta Chain, Quant, S 3,988 (*)    All other components within normal limits  URINALYSIS, ROUTINE W REFLEX MICROSCOPIC - Abnormal; Notable for the following components:   Color, Urine YELLOW (*)    APPearance HAZY (*)    Hgb urine dipstick LARGE (*)    Protein, ur 30 (*)    Leukocytes,Ua SMALL (*)    Bacteria, UA RARE (*)    All other components within normal  limits  POC URINE PREG, ED - Abnormal; Notable for the following components:   Preg Test, Ur POSITIVE (*)    All other components within normal limits  CHLAMYDIA/NGC RT PCR ([facility] ONLY)            ABO/RH     RADIOLOGY I have reviewed the ultrasound personally and interpreted and patient has a live pregnancy noted  PROCEDURES:  Critical Care performed: No  Procedures   MEDICATIONS ORDERED IN ED: Medications - No data to display   IMPRESSION / MDM / ASSESSMENT AND PLAN / ED COURSE  I reviewed the triage vital signs and the nursing notes.   Patient's presentation is most consistent with acute presentation with potential threat to life or bodily function.   Differential is pregnancy, ectopic pregnancy, threatened miscarriage, miscarriage.  Preg test was positive.  Hemoglobin is stable from her baseline.  CMP is reassuring.  hCG is elevated.  Patient is O+ does not need RhoGAM.  Urine without evidence of UTI.   IMPRESSION: 1. Single live intrauterine pregnancy as above estimated age 29 weeks and 2 days. No  evidence of complication. 2. Intramural uterine fibroid as above. 3. Trace pelvic free fluid, likely physiologic.  Discussed with patient ultrasound report and that this considered a threatened miscarriage meaning that I do not see any signs of miscarriage at this time but given she is having the bleeding there is a chance that a miscarriage could happen but there are no interventions that could be done to stop the miscarriage that she would need to return if she develops bleeding more than a pad an hour, severe pain otherwise she can follow-up outpatient with her OB/GYN.  Discussed benefits and risk of flagyl in pregnancy and pt would like to proceed with rx given symptoms.  I discussed the provisional nature of ED diagnosis, the treatment so far, the ongoing plan of care, follow up appointments and return precautions with the patient and any family or support people present. They expressed understanding and agreed with the plan, discharged home.        FINAL CLINICAL IMPRESSION(S) / ED DIAGNOSES   Final diagnoses:  Vaginal bleeding in pregnancy  Threatened miscarriage  Bacterial vaginosis in pregnancy     Rx / DC Orders   ED Discharge Orders     None        Note:  This document was prepared using Dragon voice recognition software and may include unintentional dictation errors.   Concha Se, MD 07/11/22 1639    Concha Se, MD 07/11/22 [phone]

## 2022-07-11 NOTE — ED Triage Notes (Addendum)
Pt to ED POV for vaginal bleeding, medium red color, no clots since 20 minutes ago and also lower abdominal cramping since 20 minutes ago. VSS, ambulatory with steady gait, no dizziness.  LNMP 05/24/22, positive home preg test about 1 week ago.   Pt also states that she noted fishy odor to vaginal discharge last night and thinks she may have BV.

## 2022-07-11 NOTE — Discharge Instructions (Addendum)
Please call and OB to schedule an appointment for follow-up.  At this time I do not see evidence of a miscarriage but you can monitor the area and return if you develop bleeding more than a pad an hour, fevers, severe pain or any other concerns.  IMPRESSION: 1. Single live intrauterine pregnancy as above estimated age 29 weeks and 2 days. No evidence of complication. 2. Intramural uterine fibroid as above. 3. Trace pelvic free fluid, likely physiologic.

## 2022-07-12 ENCOUNTER — Inpatient Hospital Stay (HOSPITAL_COMMUNITY)
Admission: AD | Admit: 2022-07-12 | Discharge: 2022-07-13 | Disposition: A | Discharge status: Home / Self Care | Payer: Medicaid Other | Attending: Obstetrics and Gynecology | Admitting: Obstetrics and Gynecology

## 2022-07-12 DIAGNOSIS — O039 Complete or unspecified spontaneous abortion without complication: Secondary | ICD-10-CM

## 2022-07-12 NOTE — MAU Note (Signed)
..  Anita Robinson is a 27 y.o. at Unknown here in MAU reporting:  Vaginal bleeding, with nickel-sized clots. Has to change her pad every hour or two but does not soak her pads Cramping and lower back pain.Took tylenol around 7 pm and did not help.  Went to [facility] yesterday and was told everything was fine but wants a second opinion.  Pain score: 8/10 Vitals:   07/12/22 2347  BP: 128/67  Pulse: 70  Resp: 17  Temp: 98.7 F (37.1 C)  SpO2: 100%

## 2022-07-12 NOTE — MAU Provider Note (Signed)
History     CSN: [id number]  Arrival date and time: 07/12/22 2317   Event Date/Time   First Provider Initiated Contact with Patient 07/12/22 2351      No chief complaint on file.  HPI Anita Robinson is a 27 y.o. [id number] at Unknown who receives care at March 31st.  She presents today for vaginal bleeding.  Patient states she was seen yesterday at [facility] for evaluation.  She states the bleeding got worse this morning and now she is passing clots.  She states the clots are "pea-sized and one that was a dime."  She states she passed a total of 4 clots.  She states the blood is bright red.  She reports she had some discharge prior to the bleeding and was diagnosed with BV.  Patient reports some lower abdominal cramping that is constant and has no worsening or relieving factors.  She states she took tylenol with no relief.  She rates the pain a 8/10.   OB History     Gravida  7   Para  3   Term  1   Preterm  2   AB  1   Living  3      SAB  1   IAB  0   Ectopic  0   Multiple  0   Live Births  3           Past Medical History:  Diagnosis Date   Anemia    on Iron    Past Surgical History:  Procedure Laterality Date   CESAREAN SECTION N/A 12/03/2019   Procedure: CESAREAN SECTION;  Surgeon: Steva Ready, DO;  Location: [facility] LD ORS;  Service: Obstetrics;  Laterality: N/A;   CLOSED REDUCTION MANDIBLE Left 01/28/2022   Procedure: CLOSED REDUCTION OF LEFT CONDYLOID FRACTURE WITH FIXATION;  Surgeon: Vivia Ewing, DMD;  Location: [facility] OR;  Service: Oral Surgery;  Laterality: Left;    Family History  Problem Relation Age of Onset   Asthma Mother    Asthma Sister    Asthma Brother    Alcohol abuse Neg Hx    Arthritis Neg Hx    Birth defects Neg Hx    Cancer Neg Hx    COPD Neg Hx    Depression Neg Hx    Diabetes Neg Hx    Drug abuse Neg Hx    Early death Neg Hx    Hearing loss Neg Hx    Heart disease Neg Hx    Hyperlipidemia Neg Hx    Hypertension Neg Hx     Kidney disease Neg Hx    Learning disabilities Neg Hx    Mental illness Neg Hx    Mental retardation Neg Hx    Miscarriages / Stillbirths Neg Hx    Stroke Neg Hx    Vision loss Neg Hx    Varicose Veins Neg Hx     Social History   Tobacco Use   Smoking status: Never    Passive exposure: Never   Smokeless tobacco: Never  Vaping Use   Vaping Use: Never used  Substance Use Topics   Alcohol use: Not Currently    Comment: social drinker   Drug use: No    Allergies: No Known Allergies  Medications Prior to Admission  Medication Sig Dispense Refill Last Dose   acetaminophen (TYLENOL) 500 MG tablet Take 1,000 mg by mouth every 6 (six) hours as needed (pain.).      Blood Pressure Monitoring (BLOOD PRESSURE MONITOR  AUTOMAT) DEVI 1 Device by Does not apply route daily. Automatic blood pressure cuff regular size. To monitor blood pressure regularly at home. ICD-10 code:Z34.90 1 each 0    chlorhexidine (PERIDEX) 0.12 % solution Use as directed 15 mLs in the mouth or throat 3 (three) times daily. 120 mL 5    metroNIDAZOLE (FLAGYL) 250 MG tablet Take 1 tablet (250 mg total) by mouth 3 (three) times daily for 7 days. 21 tablet 0    naproxen (NAPROSYN) 375 MG tablet Take 1 tablet (375 mg total) by mouth 2 (two) times daily. (Patient not taking: Reported on 01/20/2022) 20 tablet 0    triamcinolone cream (KENALOG) 0.1 % Apply 1 Application topically daily as needed (Eczema).       Review of Systems  Gastrointestinal:  Positive for abdominal pain (Lower abdominal-bilaterally), diarrhea (Increased, but not watery or loose) and nausea (None currently). Negative for constipation and vomiting.  Genitourinary:  Positive for vaginal bleeding and vaginal discharge. Negative for difficulty urinating and dysuria.   Physical Exam   Blood pressure 128/67, pulse 70, temperature 98.7 F (37.1 C), temperature source Oral, resp. rate 17, height 5\' 5"  (1.651 m), weight 70.7 kg, last menstrual period  05/24/2022, SpO2 100 %, not currently breastfeeding.  Physical Exam  MAU Course  Procedures  MDM ***  Assessment and Plan  ***  Anita Robinson 07/12/2022, 11:51 PM

## 2022-07-13 ENCOUNTER — Inpatient Hospital Stay (HOSPITAL_COMMUNITY): Payer: Medicaid Other

## 2022-07-13 DIAGNOSIS — O039 Complete or unspecified spontaneous abortion without complication: Secondary | ICD-10-CM

## 2022-07-13 DIAGNOSIS — Z3A00 Weeks of gestation of pregnancy not specified: Secondary | ICD-10-CM | POA: Diagnosis not present

## 2022-07-13 DIAGNOSIS — O209 Hemorrhage in early pregnancy, unspecified: Secondary | ICD-10-CM | POA: Diagnosis not present

## 2022-07-13 LAB — CBC
HCT: 34.9 % — ABNORMAL LOW (ref 36.0–46.0)
Hemoglobin: 10.4 g/dL — ABNORMAL LOW (ref 12.0–15.0)
MCH: 23.2 pg — ABNORMAL LOW (ref 26.0–34.0)
MCHC: 29.8 g/dL — ABNORMAL LOW (ref 30.0–36.0)
MCV: 77.7 fL — ABNORMAL LOW (ref 80.0–100.0)
Platelets: 269 10*3/uL (ref 150–400)
RBC: 4.49 MIL/uL (ref 3.87–5.11)
RDW: 13.7 % (ref 11.5–15.5)
WBC: 6.4 10*3/uL (ref 4.0–10.5)
nRBC: 0 % (ref 0.0–0.2)

## 2022-07-13 LAB — HCG, QUANTITATIVE, PREGNANCY: hCG, Beta Chain, Quant, S: 889 m[IU]/mL — ABNORMAL HIGH (ref <5)

## 2022-07-13 MED ORDER — ACETAMINOPHEN 500 MG PO TABS
1000.0000 mg | ORAL_TABLET | Freq: Once | ORAL | Status: AC
  Administered 2022-07-13: 1000 mg via ORAL
  Filled 2022-07-13: qty 2

## 2022-07-15 ENCOUNTER — Ambulatory Visit: Payer: Medicaid Other | Admitting: Obstetrics and Gynecology

## 2022-07-25 DIAGNOSIS — Z419 Encounter for procedure for purposes other than remedying health state, unspecified: Secondary | ICD-10-CM | POA: Diagnosis not present

## 2022-07-28 ENCOUNTER — Ambulatory Visit: Payer: Medicaid Other | Admitting: Advanced Practice Midwife

## 2022-08-24 DIAGNOSIS — Z419 Encounter for procedure for purposes other than remedying health state, unspecified: Secondary | ICD-10-CM | POA: Diagnosis not present

## 2022-09-24 DIAGNOSIS — Z419 Encounter for procedure for purposes other than remedying health state, unspecified: Secondary | ICD-10-CM | POA: Diagnosis not present

## 2022-10-23 ENCOUNTER — Inpatient Hospital Stay (HOSPITAL_COMMUNITY)
Admission: AD | Admit: 2022-10-23 | Discharge: 2022-10-23 | Discharge status: Against Medical Advice | Payer: Medicaid Other | Attending: Obstetrics and Gynecology | Admitting: Obstetrics and Gynecology

## 2022-10-23 DIAGNOSIS — R21 Rash and other nonspecific skin eruption: Secondary | ICD-10-CM | POA: Diagnosis not present

## 2022-10-23 DIAGNOSIS — R1030 Lower abdominal pain, unspecified: Secondary | ICD-10-CM | POA: Insufficient documentation

## 2022-10-23 NOTE — MAU Note (Signed)
Called patient from lobby x 2.  Registration reports they saw patient walk out but did not say anything.

## 2022-10-23 NOTE — MAU Note (Signed)
Called patient from lobby. No answer x 3

## 2022-10-23 NOTE — MAU Note (Signed)
Called patient from lobby. No answer. x1.

## 2022-10-25 DIAGNOSIS — Z419 Encounter for procedure for purposes other than remedying health state, unspecified: Secondary | ICD-10-CM | POA: Diagnosis not present

## 2022-10-26 ENCOUNTER — Inpatient Hospital Stay (HOSPITAL_COMMUNITY)
Admission: AD | Admit: 2022-10-26 | Payer: Medicaid Other | Source: Home / Self Care | Admitting: Obstetrics and Gynecology

## 2022-10-26 ENCOUNTER — Inpatient Hospital Stay (HOSPITAL_COMMUNITY): Payer: Medicaid Other

## 2022-10-26 ENCOUNTER — Encounter (HOSPITAL_COMMUNITY): Payer: Self-pay | Admitting: Obstetrics and Gynecology

## 2022-10-26 DIAGNOSIS — N83201 Unspecified ovarian cyst, right side: Secondary | ICD-10-CM | POA: Diagnosis not present

## 2022-10-26 DIAGNOSIS — B9689 Other specified bacterial agents as the cause of diseases classified elsewhere: Secondary | ICD-10-CM | POA: Insufficient documentation

## 2022-10-26 DIAGNOSIS — Z3A01 Less than 8 weeks gestation of pregnancy: Secondary | ICD-10-CM | POA: Diagnosis not present

## 2022-10-26 DIAGNOSIS — N8311 Corpus luteum cyst of right ovary: Secondary | ICD-10-CM | POA: Diagnosis not present

## 2022-10-26 DIAGNOSIS — O23591 Infection of other part of genital tract in pregnancy, first trimester: Secondary | ICD-10-CM | POA: Insufficient documentation

## 2022-10-26 DIAGNOSIS — N760 Acute vaginitis: Secondary | ICD-10-CM

## 2022-10-26 DIAGNOSIS — N898 Other specified noninflammatory disorders of vagina: Secondary | ICD-10-CM | POA: Diagnosis not present

## 2022-10-26 DIAGNOSIS — R109 Unspecified abdominal pain: Secondary | ICD-10-CM | POA: Diagnosis not present

## 2022-10-26 DIAGNOSIS — O26891 Other specified pregnancy related conditions, first trimester: Secondary | ICD-10-CM | POA: Diagnosis not present

## 2022-10-26 DIAGNOSIS — O3680X0 Pregnancy with inconclusive fetal viability, not applicable or unspecified: Secondary | ICD-10-CM | POA: Diagnosis not present

## 2022-10-26 DIAGNOSIS — O3481 Maternal care for other abnormalities of pelvic organs, first trimester: Secondary | ICD-10-CM | POA: Diagnosis not present

## 2022-10-26 DIAGNOSIS — O3411 Maternal care for benign tumor of corpus uteri, first trimester: Secondary | ICD-10-CM | POA: Insufficient documentation

## 2022-10-26 DIAGNOSIS — D251 Intramural leiomyoma of uterus: Secondary | ICD-10-CM | POA: Insufficient documentation

## 2022-10-26 DIAGNOSIS — R102 Pelvic and perineal pain: Secondary | ICD-10-CM | POA: Diagnosis present

## 2022-10-26 DIAGNOSIS — O26899 Other specified pregnancy related conditions, unspecified trimester: Secondary | ICD-10-CM

## 2022-10-26 LAB — WET PREP, GENITAL
Sperm: NONE SEEN
Trich, Wet Prep: NONE SEEN
WBC, Wet Prep HPF POC: <10 (ref <10)
Yeast Wet Prep HPF POC: NONE SEEN

## 2022-10-26 NOTE — MAU Provider Note (Signed)
Chief Complaint: Abdominal Pain   Event Date/Time   First Provider Initiated Contact with Patient 10/26/22 2322        SUBJECTIVE HPI: Anita Robinson is a 27 y.o. [id number] at Unknown by LMP who presents to maternity admissions reporting pelvic cramping since 8/10.  Also has vaginal discharge with a "high pH". She denies vaginal bleeding, vaginal itching/burning, urinary symptoms, h/a, dizziness, n/v, or fever/chills.    Abdominal Pain This is a new problem. The current episode started 1 to 4 weeks ago. The quality of the pain is cramping. The abdominal pain does not radiate. Pertinent negatives include no diarrhea, dysuria, fever or frequency. Nothing aggravates the pain. The pain is relieved by Nothing. She has tried nothing for the symptoms.   RN Note: Pt says cramping started Sat 8/10- no meds today for pain  No VB Preg test at home - on Friday - positive  Says her PH balance is high - vag D/C - started Sat  Past Medical History:  Diagnosis Date   Anemia    on Iron   Past Surgical History:  Procedure Laterality Date   CESAREAN SECTION N/A 12/03/2019   Procedure: CESAREAN SECTION;  Surgeon: Steva Ready, DO;  Location: [facility];  Service: Obstetrics;  Laterality: N/A;   CLOSED REDUCTION MANDIBLE Left 01/28/2022   Procedure: CLOSED REDUCTION OF LEFT CONDYLOID FRACTURE WITH FIXATION;  Surgeon: Vivia Ewing, DMD;  Location: [facility] OR;  Service: Oral Surgery;  Laterality: Left;   Social History   Socioeconomic History   Marital status: Married    Spouse name: Not on file   Number of children: 3   Years of education: Not on file   Highest education level: Not on file  Occupational History   Not on file  Tobacco Use   Smoking status: Never    Passive exposure: Never   Smokeless tobacco: Never  Vaping Use   Vaping status: Never Used  Substance and Sexual Activity   Alcohol use: Not Currently    Comment: social drinker   Drug use: No   Sexual activity: Yes    Birth  control/protection: None  Other Topics Concern   Not on file  Social History Narrative   Not on file   Social Determinants of Health   Financial Resource Strain: Low Risk  (04/29/2022)   Overall Financial Resource Strain (CARDIA)    Difficulty of Paying Living Expenses: Not hard at all  Food Insecurity: No Food Insecurity (04/29/2022)   Hunger Vital Sign    Worried About Running Out of Food in the Last Year: Never true    Ran Out of Food in the Last Year: Never true  Transportation Needs: Not on file  Physical Activity: Not on file  Stress: Not on file  Social Connections: Not on file  Intimate Partner Violence: Not At Risk (04/29/2022)   Humiliation, Afraid, Rape, and Kick questionnaire    Fear of Current or Ex-Partner: No    Emotionally Abused: No    Physically Abused: No    Sexually Abused: No   No current facility-administered medications on file prior to encounter.   Current Outpatient Medications on File Prior to Encounter  Medication Sig Dispense Refill   acetaminophen (TYLENOL) 500 MG tablet Take 1,000 mg by mouth every 6 (six) hours as needed (pain.).     Blood Pressure Monitoring (BLOOD PRESSURE MONITOR AUTOMAT) DEVI 1 Device by Does not apply route daily. Automatic blood pressure cuff regular size. To monitor blood pressure  regularly at home. ICD-10 code:Z34.90 1 each 0   chlorhexidine (PERIDEX) 0.12 % solution Use as directed 15 mLs in the mouth or throat 3 (three) times daily. 120 mL 5   naproxen (NAPROSYN) 375 MG tablet Take 1 tablet (375 mg total) by mouth 2 (two) times daily. (Patient not taking: Reported on 01/20/2022) 20 tablet 0   triamcinolone cream (KENALOG) 0.1 % Apply 1 Application topically daily as needed (Eczema).     No Known Allergies  I have reviewed patient's Past Medical Hx, Surgical Hx, Family Hx, Social Hx, medications and allergies.   ROS:  Review of Systems  Constitutional:  Negative for fever.  Gastrointestinal:  Positive for abdominal pain.  Negative for diarrhea.  Genitourinary:  Negative for dysuria and frequency.   Review of Systems  Other systems negative   Physical Exam  Physical Exam Patient Vitals for the past 24 hrs:  BP Temp Temp src Pulse Resp Height Weight  10/26/22 2304 114/63 98.6 F (37 C) Oral 67 16 5\' 5"  (1.651 m) 74.1 kg   Constitutional: Well-developed, well-nourished female in no acute distress.  Cardiovascular: normal rate Respiratory: normal effort GI: Abd soft, non-tender. MS: Extremities nontender, no edema, normal ROM Neurologic: Alert and oriented x 4.  GU: Neg CVAT.  PELVIC EXAM: deferred in lieu of transvaginal ultrasound  LAB RESULTS Results for orders placed or performed during the hospital encounter of 10/26/22 (from the past 24 hour(s))  Wet prep, genital     Status: Abnormal   Collection Time: 10/26/22 11:09 PM  Result Value Ref Range   Yeast Wet Prep HPF POC NONE SEEN NONE SEEN   Trich, Wet Prep NONE SEEN NONE SEEN   Clue Cells Wet Prep HPF POC PRESENT (A) NONE SEEN   WBC, Wet Prep HPF POC <10 <10   Sperm NONE SEEN   CBC     Status: Abnormal   Collection Time: 10/26/22 11:55 PM  Result Value Ref Range   WBC 6.3 4.0 - 10.5 K/uL   RBC 4.36 3.87 - 5.11 MIL/uL   Hemoglobin 10.3 (L) 12.0 - 15.0 g/dL   HCT 78.4 (L) 69.6 - 29.5 %   MCV 77.8 (L) 80.0 - 100.0 fL   MCH 23.6 (L) 26.0 - 34.0 pg   MCHC 30.4 30.0 - 36.0 g/dL   RDW 28.4 13.2 - 44.0 %   Platelets 283 150 - 400 K/uL   nRBC 0.0 0.0 - 0.2 %  hCG, quantitative, pregnancy     Status: Abnormal   Collection Time: 10/26/22 11:55 PM  Result Value Ref Range   hCG, Beta Chain, Quant, S 8,086 (H) <5 mIU/mL  Comprehensive metabolic panel     Status: Abnormal   Collection Time: 10/26/22 11:55 PM  Result Value Ref Range   Sodium 137 135 - 145 mmol/L   Potassium 3.3 (L) 3.5 - 5.1 mmol/L   Chloride 105 98 - 111 mmol/L   CO2 21 (L) 22 - 32 mmol/L   Glucose, Bld 98 70 - 99 mg/dL   BUN 9 6 - 20 mg/dL   Creatinine, Ser 1.02 0.44 -  1.00 mg/dL   Calcium 8.8 (L) 8.9 - 10.3 mg/dL   Total Protein 6.9 6.5 - 8.1 g/dL   Albumin 3.6 3.5 - 5.0 g/dL   AST 19 15 - 41 U/L   ALT 16 0 - 44 U/L   Alkaline Phosphatase 52 38 - 126 U/L   Total Bilirubin 0.3 0.3 - 1.2 mg/dL   GFR, Estimated >  60 >60 mL/min   Anion gap 11 5 - 15     --/--/O POS Performed at [facility], [address] [place], [place] [address]  [phone])  IMAGING US OB LESS THAN 14 WEEKS WITH OB TRANSVAGINAL  Result Date: 10/27/2022 CLINICAL DATA:  Initial evaluation for acute cramping, early pregnancy. EXAM: OBSTETRIC <14 WK [place] AND TRANSVAGINAL OB US TECHNIQUE: Both transabdominal and transvaginal ultrasound examinations were performed for complete evaluation of the gestation as well as the maternal uterus, adnexal regions, and pelvic cul-de-sac. Transvaginal technique was performed to assess early pregnancy. COMPARISON:  Prior study from 07/13/2022. FINDINGS: Intrauterine gestational sac: Single Yolk sac:  Present Embryo:  Present Cardiac Activity: Present Heart Rate: 114 bpm CRL:  4.5 mm   6 w   1 d                  [place] EDC: 06/20/2023 Subchorionic hemorrhage:  None visualized. Maternal uterus/adnexae: Ovaries within normal limits. 2.3 cm corpus luteal cyst present within the right ovary. 3.7 x 3.3 x 3.1 cm intramural fibroid present at the left uterine body. IMPRESSION: 1. Single viable IUP, estimated gestational age [redacted] weeks and 1 day by crown-rump length, with ultrasound EDC of 06/20/2023. No complication. 2. 3.7 cm intramural fibroid at the left uterine body. 3. 2.3 cm right ovarian corpus luteal cyst. Electronically Signed   By: Rise Mu M.D.   On: 10/27/2022 00:01     MAU Management/MDM: I have reviewed the triage vital signs and the nursing notes.   Pertinent labs & imaging results that were available during my care of the patient were reviewed by me and considered in my medical decision making (see chart for details).      I have  reviewed her medical records including past results, notes and treatments. Medical, Surgical, and family history were reviewed.  Medications and recent lab tests were reviewed  Ordered usual first trimester r/o ectopic labs.   Wet prep and cultures done  This showed bacterial vaginosis.  Discussed with pt., will treat Will check baseline Ultrasound to rule out ectopic.  US showed viable SIUP at 6 weeks  This bleeding/pain can represent a normal pregnancy with bleeding, spontaneous abortion or even an ectopic which can be life-threatening.  The process as listed above helps to determine which of these is present.    ASSESSMENT Single IUP at [redacted]w[redacted]d Pregnancy unknown locaton Bacterial vaginosis Cramping in early pregnancy  PLAN Discharge home Rx Flagyl for BV Encouraged to start prenatal care, list given  Pt stable at time of discharge. Encouraged to return here if she develops worsening of symptoms, increase in pain, fever, or other concerning symptoms.    Wynelle Bourgeois CNM, MSN Certified Nurse-Midwife 10/26/2022  11:22 PM

## 2022-10-26 NOTE — MAU Note (Addendum)
Pt says cramping started Sat 8/10- no meds today for pain  No VB Preg test at home - on Friday - positive  Says her PH balance is high - vag D/C - started Sat

## 2022-10-27 ENCOUNTER — Encounter: Payer: Self-pay | Admitting: Advanced Practice Midwife

## 2022-10-27 DIAGNOSIS — O26891 Other specified pregnancy related conditions, first trimester: Secondary | ICD-10-CM | POA: Diagnosis not present

## 2022-10-27 DIAGNOSIS — O3680X0 Pregnancy with inconclusive fetal viability, not applicable or unspecified: Secondary | ICD-10-CM

## 2022-10-27 DIAGNOSIS — N898 Other specified noninflammatory disorders of vagina: Secondary | ICD-10-CM | POA: Diagnosis not present

## 2022-10-27 DIAGNOSIS — R109 Unspecified abdominal pain: Secondary | ICD-10-CM | POA: Diagnosis not present

## 2022-10-27 DIAGNOSIS — B9689 Other specified bacterial agents as the cause of diseases classified elsewhere: Secondary | ICD-10-CM

## 2022-10-27 DIAGNOSIS — Z3A01 Less than 8 weeks gestation of pregnancy: Secondary | ICD-10-CM

## 2022-10-27 LAB — COMPREHENSIVE METABOLIC PANEL
ALT: 16 U/L (ref 0–44)
AST: 19 U/L (ref 15–41)
Albumin: 3.6 g/dL (ref 3.5–5.0)
Alkaline Phosphatase: 52 U/L (ref 38–126)
Anion gap: 11 (ref 5–15)
BUN: 9 mg/dL (ref 6–20)
CO2: 21 mmol/L — ABNORMAL LOW (ref 22–32)
Calcium: 8.8 mg/dL — ABNORMAL LOW (ref 8.9–10.3)
Chloride: 105 mmol/L (ref 98–111)
Creatinine, Ser: 0.72 mg/dL (ref 0.44–1.00)
GFR, Estimated: >60 mL/min (ref >60)
Glucose, Bld: 98 mg/dL (ref 70–99)
Potassium: 3.3 mmol/L — ABNORMAL LOW (ref 3.5–5.1)
Sodium: 137 mmol/L (ref 135–145)
Total Bilirubin: 0.3 mg/dL (ref 0.3–1.2)
Total Protein: 6.9 g/dL (ref 6.5–8.1)

## 2022-10-27 LAB — HCG, QUANTITATIVE, PREGNANCY: hCG, Beta Chain, Quant, S: 8086 m[IU]/mL — ABNORMAL HIGH (ref <5)

## 2022-10-27 LAB — CBC
HCT: 33.9 % — ABNORMAL LOW (ref 36.0–46.0)
Hemoglobin: 10.3 g/dL — ABNORMAL LOW (ref 12.0–15.0)
MCH: 23.6 pg — ABNORMAL LOW (ref 26.0–34.0)
MCHC: 30.4 g/dL (ref 30.0–36.0)
MCV: 77.8 fL — ABNORMAL LOW (ref 80.0–100.0)
Platelets: 283 10*3/uL (ref 150–400)
RBC: 4.36 MIL/uL (ref 3.87–5.11)
RDW: 13.2 % (ref 11.5–15.5)
WBC: 6.3 10*3/uL (ref 4.0–10.5)
nRBC: 0 % (ref 0.0–0.2)

## 2022-10-27 MED ORDER — METRONIDAZOLE 500 MG PO TABS: 500.0000 mg | ORAL_TABLET | Freq: Two times a day (BID) | ORAL | 0 refills | Status: AC

## 2022-10-27 NOTE — Discharge Instructions (Signed)

## 2022-10-29 ENCOUNTER — Inpatient Hospital Stay (HOSPITAL_COMMUNITY)
Admission: AD | Admit: 2022-10-29 | Discharge: 2022-10-30 | Disposition: A | Discharge status: Home / Self Care | Payer: Medicaid Other | Attending: Obstetrics and Gynecology | Admitting: Obstetrics and Gynecology

## 2022-10-29 ENCOUNTER — Inpatient Hospital Stay (HOSPITAL_COMMUNITY): Payer: Medicaid Other

## 2022-10-29 DIAGNOSIS — O209 Hemorrhage in early pregnancy, unspecified: Secondary | ICD-10-CM | POA: Diagnosis not present

## 2022-10-29 DIAGNOSIS — M549 Dorsalgia, unspecified: Secondary | ICD-10-CM | POA: Diagnosis not present

## 2022-10-29 DIAGNOSIS — O039 Complete or unspecified spontaneous abortion without complication: Secondary | ICD-10-CM | POA: Diagnosis not present

## 2022-10-29 DIAGNOSIS — Z3A01 Less than 8 weeks gestation of pregnancy: Secondary | ICD-10-CM | POA: Diagnosis not present

## 2022-10-29 DIAGNOSIS — R109 Unspecified abdominal pain: Secondary | ICD-10-CM | POA: Diagnosis not present

## 2022-10-29 DIAGNOSIS — R1030 Lower abdominal pain, unspecified: Secondary | ICD-10-CM | POA: Diagnosis present

## 2022-10-29 DIAGNOSIS — O26899 Other specified pregnancy related conditions, unspecified trimester: Secondary | ICD-10-CM | POA: Diagnosis not present

## 2022-10-29 LAB — CBC
HCT: 33.1 % — ABNORMAL LOW (ref 36.0–46.0)
Hemoglobin: 10.1 g/dL — ABNORMAL LOW (ref 12.0–15.0)
MCH: 23.7 pg — ABNORMAL LOW (ref 26.0–34.0)
MCHC: 30.5 g/dL (ref 30.0–36.0)
MCV: 77.5 fL — ABNORMAL LOW (ref 80.0–100.0)
Platelets: 282 10*3/uL (ref 150–400)
RBC: 4.27 MIL/uL (ref 3.87–5.11)
RDW: 13.3 % (ref 11.5–15.5)
WBC: 6.3 10*3/uL (ref 4.0–10.5)
nRBC: 0 % (ref 0.0–0.2)

## 2022-10-29 LAB — GC/CHLAMYDIA PROBE AMP (~~LOC~~) NOT AT ARMC
Chlamydia: NEGATIVE
Comment: NEGATIVE
Comment: NORMAL
Neisseria Gonorrhea: NEGATIVE

## 2022-10-29 LAB — HCG, QUANTITATIVE, PREGNANCY: hCG, Beta Chain, Quant, S: 6270 m[IU]/mL — ABNORMAL HIGH (ref <5)

## 2022-10-29 NOTE — MAU Note (Signed)
.  Anita Robinson is a 27 y.o. at [redacted]w[redacted]d here in MAU reporting lower abd and back pain with bleeding since 1800. No recent intercourse.  Onset of complaint: 1800 Pain score: 8 Vitals:   10/29/22 2252  BP: 116/70  Pulse: 77  Resp: 16  Temp: 98.3 F (36.8 C)  SpO2: 100%     FHT:n/a  Lab orders placed from triage:   U/a

## 2022-10-29 NOTE — MAU Provider Note (Signed)
History     CSN: [id number]  Arrival date and time: 10/29/22 2025   None     Chief Complaint  Patient presents with   Vaginal Bleeding   HPI Anita Robinson is a 27 y.o. [id number] at [redacted]w[redacted]d who presents with complaint of vaginal bleeding. She was seen on 9/2 for vaginal bleeding and reports since that visit, she has continued to have pelvic pain and passage of clots. She describes some of her clots as quarter sized. She has had ongoing pelvic pain as well as back pain. She denies fevers, chills.   Past Medical History:  Diagnosis Date   Anemia    on Iron    Past Surgical History:  Procedure Laterality Date   CESAREAN SECTION N/A 12/03/2019   Procedure: CESAREAN SECTION;  Surgeon: Steva Ready, DO;  Location: [facility];  Service: Obstetrics;  Laterality: N/A;   CLOSED REDUCTION MANDIBLE Left 01/28/2022   Procedure: CLOSED REDUCTION OF LEFT CONDYLOID FRACTURE WITH FIXATION;  Surgeon: Vivia Ewing, DMD;  Location: [facility] OR;  Service: Oral Surgery;  Laterality: Left;    Family History  Problem Relation Age of Onset   Asthma Mother    Asthma Sister    Asthma Brother    Alcohol abuse Neg Hx    Arthritis Neg Hx    Birth defects Neg Hx    Cancer Neg Hx    COPD Neg Hx    Depression Neg Hx    Diabetes Neg Hx    Drug abuse Neg Hx    Early death Neg Hx    Hearing loss Neg Hx    Heart disease Neg Hx    Hyperlipidemia Neg Hx    Hypertension Neg Hx    Kidney disease Neg Hx    Learning disabilities Neg Hx    Mental illness Neg Hx    Mental retardation Neg Hx    Miscarriages / Stillbirths Neg Hx    Stroke Neg Hx    Vision loss Neg Hx    Varicose Veins Neg Hx     Social History   Tobacco Use   Smoking status: Never    Passive exposure: Never   Smokeless tobacco: Never  Vaping Use   Vaping status: Never Used  Substance Use Topics   Alcohol use: Not Currently    Comment: social drinker   Drug use: No    Allergies: No Known Allergies  Medications Prior to Admission   Medication Sig Dispense Refill Last Dose   acetaminophen (TYLENOL) 500 MG tablet Take 1,000 mg by mouth every 6 (six) hours as needed (pain.).      Blood Pressure Monitoring (BLOOD PRESSURE MONITOR AUTOMAT) DEVI 1 Device by Does not apply route daily. Automatic blood pressure cuff regular size. To monitor blood pressure regularly at home. ICD-10 code:Z34.90 1 each 0    chlorhexidine (PERIDEX) 0.12 % solution Use as directed 15 mLs in the mouth or throat 3 (three) times daily. 120 mL 5    metroNIDAZOLE (FLAGYL) 500 MG tablet Take 1 tablet (500 mg total) by mouth 2 (two) times daily for 7 days. 14 tablet 0    triamcinolone cream (KENALOG) 0.1 % Apply 1 Application topically daily as needed (Eczema).       Review of Systems  Constitutional:  Negative for chills and fever.  Respiratory:  Negative for chest tightness and shortness of breath.   Cardiovascular:  Negative for chest pain, palpitations and leg swelling.  Gastrointestinal:  Positive for abdominal pain. Negative  for constipation, diarrhea, nausea and vomiting.  Genitourinary:  Positive for pelvic pain and vaginal bleeding. Negative for dysuria, frequency and urgency.  Musculoskeletal:  Positive for back pain.  Skin:  Negative for rash.  Neurological:  Negative for dizziness, light-headedness and headaches.   Physical Exam   Blood pressure 116/70, pulse 77, temperature 98.3 F (36.8 C), resp. rate 16, height 5\' 5"  (1.651 m), weight 74.4 kg, last menstrual period 09/08/2022, SpO2 100%.  Physical Exam Constitutional:      General: She is not in acute distress.    Appearance: Normal appearance. She is not ill-appearing.  HENT:     Head: Normocephalic and atraumatic.  Cardiovascular:     Rate and Rhythm: Normal rate.     Heart sounds: Normal heart sounds.  Pulmonary:     Effort: Pulmonary effort is normal. No respiratory distress.     Breath sounds: Normal breath sounds.  Abdominal:     General: There is no distension.      Palpations: Abdomen is soft.     Tenderness: There is no abdominal tenderness.  Musculoskeletal:        General: Normal range of motion.  Skin:    General: Skin is warm and dry.     Findings: No rash.  Neurological:     General: No focal deficit present.     Mental Status: She is alert and oriented to person, place, and time.     MAU Course  Procedures  27 y.o. [id number] at [redacted]w[redacted]d who presents w complaint of ongoing vaginal bleeding, with passage of what is described as POC. She is hemodynamically stable, and abdominal exam is reassuring. Labs obtained, which showed a lower hCG than was seen two days ago -- discussed this finding with patient and concern for SAB. Pt is Rh positive. Pt initially resistant to U/S given having some pain, but agreeable to U/S at this time. Will f/up U/S results.  2:13 AM U/S reviewed -- no GS or embryo visualized, findings c/w miscarriage. Findings discussed with patient in detail. Discussed with patient expected course, return precautions, and f/up plan. Message sent to [facility] clinic to ensure close f/up. Pt is stable for d/c.  Assessment and Plan  Spontaneous abortion - Plan: Discharge patient  Patient discussed with Dr. Alysia Penna.  Sundra Aland, MD OB Fellow, Faculty Practice [facility], [facility]  10/29/2022, 11:19 PM

## 2022-10-30 DIAGNOSIS — O209 Hemorrhage in early pregnancy, unspecified: Secondary | ICD-10-CM | POA: Diagnosis not present

## 2022-10-30 DIAGNOSIS — R109 Unspecified abdominal pain: Secondary | ICD-10-CM | POA: Diagnosis not present

## 2022-10-30 DIAGNOSIS — Z3A01 Less than 8 weeks gestation of pregnancy: Secondary | ICD-10-CM

## 2022-10-30 DIAGNOSIS — O039 Complete or unspecified spontaneous abortion without complication: Secondary | ICD-10-CM

## 2022-10-30 DIAGNOSIS — O26899 Other specified pregnancy related conditions, unspecified trimester: Secondary | ICD-10-CM | POA: Diagnosis not present

## 2022-10-30 DIAGNOSIS — M549 Dorsalgia, unspecified: Secondary | ICD-10-CM | POA: Diagnosis not present

## 2022-10-30 LAB — ABO/RH: ABO/RH(D): O POS

## 2022-10-30 NOTE — Progress Notes (Signed)
Dr Lucianne Muss in earlier to discuss test results and d/c plan. Written and verbal d/c instructions given and understanding voiced.

## 2022-11-06 ENCOUNTER — Inpatient Hospital Stay (HOSPITAL_COMMUNITY): Payer: Medicaid Other | Attending: Obstetrics and Gynecology

## 2022-11-09 ENCOUNTER — Other Ambulatory Visit: Payer: Medicaid Other

## 2022-11-12 ENCOUNTER — Ambulatory Visit: Payer: Medicaid Other | Admitting: Obstetrics and Gynecology

## 2022-11-18 ENCOUNTER — Ambulatory Visit: Payer: Medicaid Other | Admitting: Family Medicine

## 2022-11-18 ENCOUNTER — Encounter: Payer: Self-pay | Admitting: *Deleted

## 2022-11-18 ENCOUNTER — Encounter: Payer: Self-pay | Admitting: Family Medicine

## 2022-11-18 NOTE — Progress Notes (Signed)
Patient did not keep appointment today. She will be called to reschedule.  

## 2022-11-24 DIAGNOSIS — Z419 Encounter for procedure for purposes other than remedying health state, unspecified: Secondary | ICD-10-CM | POA: Diagnosis not present

## 2022-12-09 ENCOUNTER — Inpatient Hospital Stay: Payer: Medicaid Other

## 2022-12-09 ENCOUNTER — Other Ambulatory Visit: Payer: Self-pay

## 2022-12-09 ENCOUNTER — Encounter: Payer: Self-pay | Admitting: Emergency Medicine

## 2022-12-09 ENCOUNTER — Observation Stay
Admission: EM | Admit: 2022-12-09 | Discharge: 2022-12-10 | Disposition: A | Discharge status: Home / Self Care | Payer: Medicaid Other | Attending: Internal Medicine | Admitting: Internal Medicine

## 2022-12-09 DIAGNOSIS — K429 Umbilical hernia without obstruction or gangrene: Secondary | ICD-10-CM | POA: Diagnosis not present

## 2022-12-09 DIAGNOSIS — N12 Tubulo-interstitial nephritis, not specified as acute or chronic: Secondary | ICD-10-CM | POA: Diagnosis not present

## 2022-12-09 DIAGNOSIS — N1330 Unspecified hydronephrosis: Secondary | ICD-10-CM | POA: Diagnosis not present

## 2022-12-09 DIAGNOSIS — Z1152 Encounter for screening for COVID-19: Secondary | ICD-10-CM | POA: Insufficient documentation

## 2022-12-09 DIAGNOSIS — Z79899 Other long term (current) drug therapy: Secondary | ICD-10-CM | POA: Diagnosis not present

## 2022-12-09 DIAGNOSIS — D509 Iron deficiency anemia, unspecified: Secondary | ICD-10-CM | POA: Diagnosis not present

## 2022-12-09 DIAGNOSIS — N10 Acute tubulo-interstitial nephritis: Principal | ICD-10-CM | POA: Diagnosis present

## 2022-12-09 DIAGNOSIS — R509 Fever, unspecified: Secondary | ICD-10-CM | POA: Diagnosis present

## 2022-12-09 DIAGNOSIS — Z951 Presence of aortocoronary bypass graft: Secondary | ICD-10-CM | POA: Insufficient documentation

## 2022-12-09 DIAGNOSIS — E876 Hypokalemia: Secondary | ICD-10-CM | POA: Diagnosis present

## 2022-12-09 LAB — LIPASE, BLOOD: Lipase: 23 U/L (ref 11–51)

## 2022-12-09 LAB — COMPREHENSIVE METABOLIC PANEL
ALT: 9 U/L (ref 0–44)
AST: 14 U/L — ABNORMAL LOW (ref 15–41)
Albumin: 4 g/dL (ref 3.5–5.0)
Alkaline Phosphatase: 51 U/L (ref 38–126)
Anion gap: 9 (ref 5–15)
BUN: 8 mg/dL (ref 6–20)
CO2: 28 mmol/L (ref 22–32)
Calcium: 9.1 mg/dL (ref 8.9–10.3)
Chloride: 98 mmol/L (ref 98–111)
Creatinine, Ser: 0.8 mg/dL (ref 0.44–1.00)
GFR, Estimated: >60 mL/min (ref >60)
Glucose, Bld: 108 mg/dL — ABNORMAL HIGH (ref 70–99)
Potassium: 3 mmol/L — ABNORMAL LOW (ref 3.5–5.1)
Sodium: 135 mmol/L (ref 135–145)
Total Bilirubin: 0.6 mg/dL (ref 0.3–1.2)
Total Protein: 8.1 g/dL (ref 6.5–8.1)

## 2022-12-09 LAB — CBC
HCT: 33.6 % — ABNORMAL LOW (ref 36.0–46.0)
Hemoglobin: 10.3 g/dL — ABNORMAL LOW (ref 12.0–15.0)
MCH: 23.6 pg — ABNORMAL LOW (ref 26.0–34.0)
MCHC: 30.7 g/dL (ref 30.0–36.0)
MCV: 76.9 fL — ABNORMAL LOW (ref 80.0–100.0)
Platelets: 373 10*3/uL (ref 150–400)
RBC: 4.37 MIL/uL (ref 3.87–5.11)
RDW: 12.4 % (ref 11.5–15.5)
WBC: 9.4 10*3/uL (ref 4.0–10.5)
nRBC: 0 % (ref 0.0–0.2)

## 2022-12-09 LAB — URINALYSIS, ROUTINE W REFLEX MICROSCOPIC
Bilirubin Urine: NEGATIVE
Glucose, UA: NEGATIVE mg/dL
Ketones, ur: NEGATIVE mg/dL
Nitrite: NEGATIVE
Protein, ur: 30 mg/dL — AB
Specific Gravity, Urine: 1.017 (ref 1.005–1.030)
pH: 6 (ref 5.0–8.0)

## 2022-12-09 LAB — MAGNESIUM: Magnesium: 2.1 mg/dL (ref 1.7–2.4)

## 2022-12-09 LAB — PHOSPHORUS: Phosphorus: 2.8 mg/dL (ref 2.5–4.6)

## 2022-12-09 LAB — POC URINE PREG, ED: Preg Test, Ur: NEGATIVE

## 2022-12-09 LAB — SARS CORONAVIRUS 2 BY RT PCR: SARS Coronavirus 2 by RT PCR: NEGATIVE

## 2022-12-09 MED ORDER — POTASSIUM CHLORIDE CRYS ER 20 MEQ PO TBCR
40.0000 meq | EXTENDED_RELEASE_TABLET | ORAL | Status: AC
  Administered 2022-12-09 – 2022-12-10 (×2): 40 meq via ORAL
  Filled 2022-12-09 (×2): qty 2

## 2022-12-09 MED ORDER — KETOROLAC TROMETHAMINE 30 MG/ML IJ SOLN
30.0000 mg | Freq: Once | INTRAMUSCULAR | Status: AC
  Administered 2022-12-09: 30 mg via INTRAVENOUS
  Filled 2022-12-09: qty 1

## 2022-12-09 MED ORDER — SODIUM CHLORIDE 0.9 % IV SOLN: 1.0000 g | INTRAVENOUS | Status: DC

## 2022-12-09 MED ORDER — SODIUM CHLORIDE 0.9 % IV SOLN
1.0000 g | Freq: Once | INTRAVENOUS | Status: AC
  Administered 2022-12-09: 1 g via INTRAVENOUS
  Filled 2022-12-09: qty 10

## 2022-12-09 MED ORDER — LACTATED RINGERS IV BOLUS (SEPSIS)
1000.0000 mL | Freq: Once | INTRAVENOUS | Status: AC
  Administered 2022-12-09: 1000 mL via INTRAVENOUS

## 2022-12-09 MED ORDER — ONDANSETRON HCL 4 MG/2ML IJ SOLN: 4.0000 mg | Freq: Three times a day (TID) | INTRAMUSCULAR | Status: DC | PRN

## 2022-12-09 MED ORDER — ACETAMINOPHEN 325 MG PO TABS: 650.0000 mg | ORAL_TABLET | Freq: Four times a day (QID) | ORAL | Status: DC | PRN

## 2022-12-09 MED ORDER — SODIUM CHLORIDE 0.9 % IV BOLUS
500.0000 mL | Freq: Once | INTRAVENOUS | Status: AC
  Administered 2022-12-09: 500 mL via INTRAVENOUS

## 2022-12-09 MED ORDER — ACETAMINOPHEN 325 MG PO TABS
650.0000 mg | ORAL_TABLET | Freq: Once | ORAL | Status: AC
  Administered 2022-12-09: 650 mg via ORAL
  Filled 2022-12-09: qty 2

## 2022-12-09 MED ORDER — ENOXAPARIN SODIUM 40 MG/0.4ML IJ SOSY: 40.0000 mg | PREFILLED_SYRINGE | INTRAMUSCULAR | Status: DC

## 2022-12-09 NOTE — ED Notes (Signed)
Patient stating she would like to leave after CT scan and fluids if her HR comes down and no findings on CT scan. MD Clyde Lundborg notified.

## 2022-12-09 NOTE — ED Triage Notes (Signed)
Patient ambulatory to triage with complaints of upper back pain, lower abdominal pain, and dysuria. Patient states for the past week she has been running fevers at home treated with tylenol but fever comes back repeatedly. She states approx 1.5 months ago she had a miscarriage and took misoprostol and feels she has had some urinary symptoms since then and wonders if she has some retained products or kidney infection. Last took at tylenol at 1900.

## 2022-12-09 NOTE — H&P (Signed)
History and Physical    Anita Robinson EXB:[id number] DOB: 1996-01-31 DOA: 12/09/2022  Referring MD/NP/PA:   PCP: Pcp, No   Patient coming from:  The patient is coming from home.     Chief Complaint: dysuria, fever  HPI: Anita Robinson is a 27 y.o. female with medical history significant of iron deficiency anemia, recent miscarriage, who presents with dysuria and fever.  Pt states that she has fever and chills for about a week.  She reports dysuria, burning on urination, increased urinary frequency.  Denies hematuria.  Patient has nausea, no vomiting, diarrhea.  She reports left lower quadrant abdominal pain, which is constant, aching, moderate, radiating to the left flank area.  Not aggravated or alleviated by any known factors.  Denies chest pain, cough and SOB.  Of note, patient had miscarriage recently. She had transvaginal ultrasound on 9/6/2 which showed no gestational sac or embryo, compatible with miscarriage. She denies any vaginal discharge or vaginal bleeding now.   [name] data reviewed independently and ED Course: pt was found to have WBC 9.4, GFR> 60, potassium 3.0, lipase 23, positive urinalysis (hazy appearance, moderate amount leukocyte, rare bacteria, WBC 21-50), negative pregnancy test, temperature 100, blood pressure 110/75, heart rate 127, RR 18, oxygen saturation 98% on room air.  Patient is admitted to MedSurg bed as inpatient.   EKG:  Not done in ED, will get one.     Review of Systems:   General: has fevers, chills, no body weight gain, has fatigue HEENT: no blurry vision, hearing changes or sore throat Respiratory: no dyspnea, coughing, wheezing CV: no chest pain, no palpitations GI: has nausea, abdominal pain, no diarrhea, constipation, vomiting, GU: has dysuria, burning on urination, increased urinary frequency, no hematuria  Ext: no leg edema Neuro: no unilateral weakness, numbness, or tingling, no vision change or hearing loss Skin: no rash, no skin  tear. MSK: No muscle spasm, no deformity, no limitation of range of movement in spin Heme: No easy bruising.  Travel history: No recent long distant travel.   Allergy: No Known Allergies  Past Medical History:  Diagnosis Date   Anemia    on Iron    Past Surgical History:  Procedure Laterality Date   CESAREAN SECTION N/A 12/03/2019   Procedure: CESAREAN SECTION;  Surgeon: Steva Ready, DO;  Location: [facility] LD ORS;  Service: Obstetrics;  Laterality: N/A;   CLOSED REDUCTION MANDIBLE Left 01/28/2022   Procedure: CLOSED REDUCTION OF LEFT CONDYLOID FRACTURE WITH FIXATION;  Surgeon: Vivia Ewing, DMD;  Location: [facility] OR;  Service: Oral Surgery;  Laterality: Left;   CORONARY ARTERY BYPASS GRAFT      Social History:  reports that she has never smoked. She has never been exposed to tobacco smoke. She has never used smokeless tobacco. She reports that she does not currently use alcohol. She reports that she does not use drugs.  Family History:  Family History  Problem Relation Age of Onset   Asthma Mother    Asthma Sister    Asthma Brother    Alcohol abuse Neg Hx    Arthritis Neg Hx    Birth defects Neg Hx    Cancer Neg Hx    COPD Neg Hx    Depression Neg Hx    Diabetes Neg Hx    Drug abuse Neg Hx    Early death Neg Hx    Hearing loss Neg Hx    Heart disease Neg Hx    Hyperlipidemia Neg Hx  Hypertension Neg Hx    Kidney disease Neg Hx    Learning disabilities Neg Hx    Mental illness Neg Hx    Mental retardation Neg Hx    Miscarriages / Stillbirths Neg Hx    Stroke Neg Hx    Vision loss Neg Hx    Varicose Veins Neg Hx      Prior to Admission medications   Medication Sig Start Date End Date Taking? Authorizing Provider  acetaminophen (TYLENOL) 500 MG tablet Take 1,000 mg by mouth every 6 (six) hours as needed (pain.).    [provider]  chlorhexidine (PERIDEX) 0.12 % solution Use as directed 15 mLs in the mouth or throat 3 (three) times daily. 01/28/22   Drab,  Jill Alexanders, DMD  triamcinolone cream (KENALOG) 0.1 % Apply 1 Application topically daily as needed (Eczema).    [provider]    Physical Exam: Vitals:   12/09/22 2056 12/09/22 2213 12/09/22 2230 12/09/22 2300  BP:  110/75 120/82 110/75  Pulse:  (!) 116 (!) 106 98  Resp:  18 16   Temp:  100 F (37.8 C)    TempSrc:  Oral    SpO2:  98% 98% 98%  Weight: 74.4 kg     Height: 5\' 5"  (1.651 m)      General: Not in acute distress HEENT:       Eyes: PERRL, EOMI, no jaundice       ENT: No discharge from the ears and nose, no pharynx injection, no tonsillar enlargement.        Neck: No JVD, no bruit, no mass felt. Heme: No neck lymph node enlargement. Cardiac: S1/S2, RRR, No murmurs, No gallops or rubs. Respiratory: No rales, wheezing, rhonchi or rubs. GI: Soft, nondistended, nontender, no rebound pain, no organomegaly, BS present. GU: has positive left  CVA tenderness Ext: No pitting leg edema bilaterally. 1+DP/PT pulse bilaterally. Musculoskeletal: No joint deformities, No joint redness or warmth, no limitation of ROM in spin. Skin: No rashes.  Neuro: Alert, oriented X3, cranial nerves II-XII grossly intact, moves all extremities normally.  Psych: Patient is not psychotic, no suicidal or hemocidal ideation.  Labs on Admission: I have personally reviewed following labs and imaging studies  CBC: Recent Labs  Lab 12/09/22 2101  WBC 9.4  HGB 10.3*  HCT 33.6*  MCV 76.9*  PLT 373   Basic Metabolic Panel: Recent Labs  Lab 12/09/22 2101  NA 135  K 3.0*  CL 98  CO2 28  GLUCOSE 108*  BUN 8  CREATININE 0.80  CALCIUM 9.1   GFR: Estimated Creatinine Clearance: 104.8 mL/min (by C-G formula based on SCr of 0.8 mg/dL). Liver Function Tests: Recent Labs  Lab 12/09/22 2101  AST 14*  ALT 9  ALKPHOS 51  BILITOT 0.6  PROT 8.1  ALBUMIN 4.0   Recent Labs  Lab 12/09/22 2101  LIPASE 23   No results for input(s): "AMMONIA" in the last 168 hours. Coagulation  Profile: No results for input(s): "INR", "PROTIME" in the last 168 hours. Cardiac Enzymes: No results for input(s): "CKTOTAL", "CKMB", "CKMBINDEX", "TROPONINI" in the last 168 hours. BNP (last 3 results) No results for input(s): "PROBNP" in the last 8760 hours. HbA1C: No results for input(s): "HGBA1C" in the last 72 hours. CBG: No results for input(s): "GLUCAP" in the last 168 hours. Lipid Profile: No results for input(s): "CHOL", "HDL", "LDLCALC", "TRIG", "CHOLHDL", "LDLDIRECT" in the last 72 hours. Thyroid Function Tests: No results for input(s): "TSH", "T4TOTAL", "FREET4", "T3FREE", "  THYROIDAB" in the last 72 hours. Anemia Panel: No results for input(s): "VITAMINB12", "FOLATE", "FERRITIN", "TIBC", "IRON", "RETICCTPCT" in the last 72 hours. Urine analysis:    Component Value Date/Time   COLORURINE YELLOW (A) 12/09/2022 2101   APPEARANCEUR HAZY (A) 12/09/2022 2101   LABSPEC 1.017 12/09/2022 2101   PHURINE 6.0 12/09/2022 2101   GLUCOSEU NEGATIVE 12/09/2022 2101   HGBUR MODERATE (A) 12/09/2022 2101   BILIRUBINUR NEGATIVE 12/09/2022 2101   KETONESUR NEGATIVE 12/09/2022 2101   PROTEINUR 30 (A) 12/09/2022 2101   UROBILINOGEN 1.0 11/20/2014 1945   NITRITE NEGATIVE 12/09/2022 2101   LEUKOCYTESUR MODERATE (A) 12/09/2022 2101   Sepsis Labs: @LABRCNTIP (procalcitonin:4,lacticidven:4) )No results found for this or any previous visit (from the past 240 hour(s)).   Radiological Exams on Admission: No results found.    Assessment/Plan Principal Problem:   Acute pyelonephritis Active Problems:   Hypokalemia   IDA (iron deficiency anemia)   Assessment and Plan:  Acute pyelonephritis: Patient has typical symptoms of urinary infection.  She also reports left lower quadrant abdominal pain and left flank pain, will need to rule out kidney stone.  Currently patient does not meets criteria for sepsis, but she is at high risk of developing sepsis. She has WBC 9.4, heart rate 127, RR 18,  temperature 100. Patient at this time expresses desire to leave the Hospital. I spoke with her by phone. Patient has been warned that this is not Medically advisable at this time since she is at high risk of developing sepsis and hypotension, which can result in Medical complications, even Death. Patient fully understands the risk. I am hoping she will stay in hospital to have appropriate treatment.  -  Admit to med-surg bed as an inpatient -  Ceftriaxone by IV - Follow up results of urine and blood cx and amend antibiotic regimen if needed per sensitivity results - prn Zofran for nausea - will get Procalcitonin and trend lactic acid levels - IVF: total of 2L of NS bolus - f/u CT-renal stone   Hypokalemia: Potassium 3.0 -Repleted potassium -Check magnesium level -Check a phosphorus level  IDA (iron deficiency anemia): Hemoglobin stable 10.3 (10.1 on 10/29/2022).  Patient states that she is intermittently taking iron supplement. -Follow-up with PCP       DVT ppx: SQ Lovenox  Code Status: Full code     Family Communication: not done, no family member is at bed side.   Disposition Plan:  Anticipate discharge back to previous environment  Consults called:  none as inpt    progressive unit for obs   as inpt       Dispo: The patient is from: Home              Anticipated d/c is to: Home              Anticipated d/c date is: 2 days              Patient currently is not medically stable to d/c.    Severity of Illness:  The appropriate patient status for this patient is INPATIENT. Inpatient status is judged to be reasonable and necessary in order to provide the required intensity of service to ensure the patient's safety. The patient's presenting symptoms, physical exam findings, and initial radiographic and laboratory data in the context of their chronic comorbidities is felt to place them at high risk for further clinical deterioration. Furthermore, it is not anticipated that the  patient will be medically stable for discharge from  the hospital within 2 midnights of admission.   * I certify that at the point of admission it is my clinical judgment that the patient will require inpatient hospital care spanning beyond 2 midnights from the point of admission due to high intensity of service, high risk for further deterioration and high frequency of surveillance required.*       Date of Service 12/09/2022    Lorretta Harp [facility]   If 7PM-7AM, please contact night-coverage [email or web address] 12/09/2022, 11:17 PM

## 2022-12-09 NOTE — ED Provider Notes (Signed)
[facility] Provider Note    Event Date/Time   First MD Initiated Contact with Patient 12/09/22 2231     (approximate)   History   Abdominal Pain, Back Pain, and Dysuria   HPI  Anita Robinson is a 27 y.o. female who presents with complaints of dysuria and left back pain, fevers and chills which is developed over the last several days.  She reports this started as dysuria and seems to have progressed from there     Physical Exam   Triage Vital Signs: ED Triage Vitals  Encounter Vitals Group     BP 12/09/22 2055 134/83     Systolic BP Percentile --      Diastolic BP Percentile --      Pulse Rate 12/09/22 2055 (!) 127     Resp 12/09/22 2055 18     Temp 12/09/22 2055 99.5 F (37.5 C)     Temp Source 12/09/22 2055 Oral     SpO2 12/09/22 2055 100 %     Weight 12/09/22 2056 74.4 kg (164 lb)     Height 12/09/22 2056 1.651 m (5\' 5" )     Head Circumference --      Peak Flow --      Pain Score 12/09/22 2056 7     Pain Loc --      Pain Education --      Exclude from Growth Chart --     Most recent vital signs: Vitals:   12/09/22 2213 12/09/22 2230  BP: 110/75 120/82  Pulse: (!) 116 (!) 106  Resp: 18 16  Temp: 100 F (37.8 C)   SpO2: 98% 98%     General: Awake, no distress.  CV:  Good peripheral perfusion.  Tachycardia Resp:  Normal effort.  Abd:  No distention.  Mild left CVA tenderness Other:     ED Results / Procedures / Treatments   Labs (all labs ordered are listed, but only abnormal results are displayed) Labs Reviewed  COMPREHENSIVE METABOLIC PANEL - Abnormal; Notable for the following components:      Result Value   Potassium 3.0 (*)    Glucose, Bld 108 (*)    AST 14 (*)    All other components within normal limits  CBC - Abnormal; Notable for the following components:   Hemoglobin 10.3 (*)    HCT 33.6 (*)    MCV 76.9 (*)    MCH 23.6 (*)    All other components within normal limits  URINALYSIS, ROUTINE W REFLEX  MICROSCOPIC - Abnormal; Notable for the following components:   Color, Urine YELLOW (*)    APPearance HAZY (*)    Hgb urine dipstick MODERATE (*)    Protein, ur 30 (*)    Leukocytes,Ua MODERATE (*)    Bacteria, UA RARE (*)    All other components within normal limits  LIPASE, BLOOD  POC URINE PREG, ED     EKG     RADIOLOGY     PROCEDURES:  Critical Care performed:   Procedures   MEDICATIONS ORDERED IN ED: Medications  sodium chloride 0.9 % bolus 500 mL (0 mLs Intravenous Stopped 12/09/22 2147)  ketorolac (TORADOL) 30 MG/ML injection 30 mg (30 mg Intravenous Given 12/09/22 2145)  cefTRIAXone (ROCEPHIN) 1 g in sodium chloride 0.9 % 100 mL IVPB (0 g Intravenous Stopped 12/09/22 2216)  sodium chloride 0.9 % bolus 500 mL (500 mLs Intravenous New Bag/Given 12/09/22 2225)  acetaminophen (TYLENOL) tablet 650 mg (650  mg Oral Given 12/09/22 2225)     IMPRESSION / MDM / ASSESSMENT AND PLAN / ED COURSE  I reviewed the triage vital signs and the nursing notes. Patient's presentation is most consistent with acute presentation with potential threat to life or bodily function.  Patient presents with symptoms as detailed above, she is tachycardic and temperature is mildly elevated.'s symptoms are consistent with likely pyelonephritis versus UTI, doubt kidney stone given initial symptom of dysuria progressing up her left side into her back.  Lab work reviewed and is overall reassuring, urinalysis is consistent with urinary tract infection.  Patient treated with IV fluids, IV Toradol, IV Rocephin with only minimal improvement in heart rate  Have discussed with the hospitalist for admission        FINAL CLINICAL IMPRESSION(S) / ED DIAGNOSES   Final diagnoses:  Pyelonephritis     Rx / DC Orders   ED Discharge Orders     None        Note:  This document was prepared using Dragon voice recognition software and may include unintentional dictation errors.   Jene Every, MD 12/09/22 2238

## 2022-12-09 NOTE — ED Notes (Signed)
Hospitalist in with patient.

## 2022-12-10 DIAGNOSIS — N10 Acute tubulo-interstitial nephritis: Secondary | ICD-10-CM | POA: Diagnosis not present

## 2022-12-10 LAB — CBC
HCT: 29 % — ABNORMAL LOW (ref 36.0–46.0)
Hemoglobin: 8.9 g/dL — ABNORMAL LOW (ref 12.0–15.0)
MCH: 23.7 pg — ABNORMAL LOW (ref 26.0–34.0)
MCHC: 30.7 g/dL (ref 30.0–36.0)
MCV: 77.1 fL — ABNORMAL LOW (ref 80.0–100.0)
Platelets: 331 10*3/uL (ref 150–400)
RBC: 3.76 MIL/uL — ABNORMAL LOW (ref 3.87–5.11)
RDW: 12.5 % (ref 11.5–15.5)
WBC: 9.6 10*3/uL (ref 4.0–10.5)
nRBC: 0 % (ref 0.0–0.2)

## 2022-12-10 LAB — BASIC METABOLIC PANEL
Anion gap: 9 (ref 5–15)
BUN: 8 mg/dL (ref 6–20)
CO2: 25 mmol/L (ref 22–32)
Calcium: 8.4 mg/dL — ABNORMAL LOW (ref 8.9–10.3)
Chloride: 104 mmol/L (ref 98–111)
Creatinine, Ser: 0.71 mg/dL (ref 0.44–1.00)
GFR, Estimated: >60 mL/min (ref >60)
Glucose, Bld: 118 mg/dL — ABNORMAL HIGH (ref 70–99)
Potassium: 3.2 mmol/L — ABNORMAL LOW (ref 3.5–5.1)
Sodium: 138 mmol/L (ref 135–145)

## 2022-12-10 LAB — PROCALCITONIN: Procalcitonin: <0.1 ng/mL

## 2022-12-10 LAB — LACTIC ACID, PLASMA
Lactic Acid, Venous: 0.8 mmol/L (ref 0.5–1.9)
Lactic Acid, Venous: 0.9 mmol/L (ref 0.5–1.9)

## 2022-12-10 LAB — HIV ANTIBODY (ROUTINE TESTING W REFLEX): HIV Screen 4th Generation wRfx: NONREACTIVE

## 2022-12-10 MED ORDER — CEPHALEXIN 500 MG PO CAPS: 500.0000 mg | ORAL_CAPSULE | Freq: Two times a day (BID) | ORAL | 0 refills | Status: AC

## 2022-12-10 MED ORDER — FLUCONAZOLE 100 MG PO TABS: 200.0000 mg | ORAL_TABLET | Freq: Once | ORAL | 0 refills | Status: AC

## 2022-12-10 MED ORDER — CEPHALEXIN 500 MG PO CAPS
500.0000 mg | ORAL_CAPSULE | Freq: Two times a day (BID) | ORAL | Status: DC
  Administered 2022-12-10: 500 mg via ORAL
  Filled 2022-12-10: qty 1

## 2022-12-10 NOTE — Discharge Summary (Addendum)
Physician Discharge Summary   Patient: Anita Robinson MRN: [id number] DOB: 12/25/1995  Admit date:     12/09/2022  Discharge date: 12/10/22  Discharge Physician: Enedina Finner   PCP: Pcp, No   Recommendations at discharge:    Establish PCP In the area Drink plenty of fluids  Discharge Diagnoses: Principal Problem:   Acute pyelonephritis Active Problems:   Hypokalemia   IDA (iron deficiency anemia)    Anita Robinson is a 27 y.o. female with medical history significant of iron deficiency anemia, recent miscarriage, who presents with dysuria and fever.  Pt states that she has fever and chills for about a week.  She reports dysuria, burning on urination, increased urinary frequency.  Acute pyelonephritis: Patient has typical symptoms of urinary infection.  She also reports left lower quadrant abdominal pain and left flank pain --Ceftriaxone by IV--change to po keflex --pt is requesting to go home since she has kids and worried about them since the babysitter has to leave. No fever,N,V. Vitals ok. Do not want her to leave AMAm so will d/c her with po abxs. Instructed to complete the course --BC so far neg --UC not resulted - prn Zofran for nausea --CT renal 1. Left perinephric edema with mild hydroureteronephrosis. No ureteral stone is seen. Findings could be due to a recently passed stone or an ascending UTI with pyelonephritis. 2. Bilateral nonobstructive micronephrolithiasis. 3. Mild free pelvic fluid in the cul-de-sac. This could be physiologic or due to a ruptured ovarian cyst but is nonspecific, and was not seen on prior studies. 4. Mildly prominent liver with mild steatosis. 5. Umbilical rectus diastasis and small umbilical fat hernia.  Hypokalemia: Potassium 3.0--3.2 -Repleted potassium   IDA (iron deficiency anemia): Hemoglobin stable 10.3 (10.1 on 10/29/2022).  Patient states that she is intermittently taking iron supplement. -Follow-up with PCP  D/c home per pt's  request      Disposition: Home Diet recommendation:  Discharge Diet Orders (From admission, onward)     Start     Ordered   12/10/22 0000  Diet general        12/10/22 0837            DISCHARGE MEDICATION: Allergies as of 12/10/2022   No Known Allergies      Medication List     STOP taking these medications    chlorhexidine 0.12 % solution Commonly known as: PERIDEX       TAKE these medications    acetaminophen 500 MG tablet Commonly known as: TYLENOL Take 1,000 mg by mouth every 6 (six) hours as needed (pain.).   cephALEXin 500 MG capsule Commonly known as: KEFLEX Take 1 capsule (500 mg total) by mouth every 12 (twelve) hours for 7 days.   fluconazole 100 MG tablet Commonly known as: Diflucan Take 2 tablets (200 mg total) by mouth once for 1 dose.   prenatal multivitamin Tabs tablet Take 1 tablet by mouth daily at 12 noon.        Discharge Exam: Filed Weights   12/09/22 2056  Weight: 74.4 kg    A and oriented x3 Resp: clear to auscultation CV normal  Condition at discharge: fair  The results of significant diagnostics from this hospitalization (including imaging, microbiology, ancillary and laboratory) are listed below for reference.   Imaging Studies: CT RENAL STONE STUDY  Result Date: 12/10/2022 CLINICAL DATA:  Upper back pain, lower abdominal pain, flank pain and dysuria. Subjective fevers at home. EXAM: CT ABDOMEN AND PELVIS WITHOUT CONTRAST TECHNIQUE:  Multidetector CT imaging of the abdomen and pelvis was performed following the standard protocol without IV contrast. RADIATION DOSE REDUCTION: This exam was performed according to the departmental dose-optimization program which includes automated exposure control, adjustment of the mA and/or kV according to patient size and/or use of iterative reconstruction technique. COMPARISON:  CTs with IV contrast 02/17/2018 and 02/05/2018. FINDINGS: Lower chest: No abnormality. Hepatobiliary: The  liver is 20 cm length with mild generalized steatosis. No masses seen without contrast. The gallbladder and bile ducts are unremarkable. Pancreas: Not well seen due to lack of IV contrast. No obvious mass or ductal dilatation. Spleen: Unremarkable without contrast.  No splenomegaly. Adrenals/Urinary Tract: There is no adrenal mass. There is no contour deforming abnormality of either kidney. Both kidneys demonstrate several scattered punctate nonobstructive caliceal stones. On the left there is perinephric edema with mild hydroureteronephrosis. The ureters are difficult to follow in this patient with a general paucity of body fat within the abdomen but no ureteral stone is suspected. The bladder thickness is normal. There are multiple bilateral pelvic phleboliths but they are outside the expected plane of the ureters. No new pelvic calcification is seen either. The left-sided findings could be due to a recently passed stone or due to an ascending UTI with pyelonephritis. Stomach/Bowel: There's scattered positive contrast in the stomach and proximal small bowel. No dilatation or wall thickening is seen including the appendix. The large intestine is unremarkable. Vascular/Lymphatic: Not well evaluated without contrast. No obvious significant vascular findings. No bulky or encasing adenopathy. Reproductive: Retroverted intact uterus. No adnexal mass allowing for study limitations. Other: There is mild free fluid in the pelvic cul-de-sac. Free fluid was not seen in this space previously. Possible this could be physiologic or due to a ruptured ovarian cyst but is nonspecific. The fluid is low in density. There is no evidence of a free hemorrhage, free air, or abscess. Umbilical rectus diastasis and small umbilical fat hernia are again shown. Musculoskeletal: No acute or significant osseous findings. IMPRESSION: 1. Left perinephric edema with mild hydroureteronephrosis. No ureteral stone is seen. Findings could be due to a  recently passed stone or an ascending UTI with pyelonephritis. 2. Bilateral nonobstructive micronephrolithiasis. 3. Mild free pelvic fluid in the cul-de-sac. This could be physiologic or due to a ruptured ovarian cyst but is nonspecific, and was not seen on prior studies. 4. Mildly prominent liver with mild steatosis. 5. Umbilical rectus diastasis and small umbilical fat hernia. Electronically Signed   By: Almira Bar M.D.   On: 12/10/2022 04:22    Microbiology: Results for orders placed or performed during the hospital encounter of 12/09/22  SARS Coronavirus 2 by RT PCR (hospital order, performed in [facility] hospital lab) *cepheid single result test* Anterior Nasal Swab     Status: None   Collection Time: 12/09/22 10:51 PM   Specimen: Anterior Nasal Swab  Result Value Ref Range Status   SARS Coronavirus 2 by RT PCR NEGATIVE NEGATIVE Final    Comment: (NOTE) SARS-CoV-2 target nucleic acids are NOT DETECTED.  The SARS-CoV-2 RNA is generally detectable in upper and lower respiratory specimens during the acute phase of infection. The lowest concentration of SARS-CoV-2 viral copies this assay can detect is 250 copies / mL. A negative result does not preclude SARS-CoV-2 infection and should not be used as the sole basis for treatment or other patient management decisions.  A negative result may occur with improper specimen collection / handling, submission of specimen other than nasopharyngeal swab, presence  of viral mutation(s) within the areas targeted by this assay, and inadequate number of viral copies (<250 copies / mL). A negative result must be combined with clinical observations, patient history, and epidemiological information.  Fact Sheet for Patients:   RoadLapTop.co.za  Fact Sheet for Healthcare Providers: http://kim-miller.com/  This test is not yet approved or  cleared by the [place] FDA and has been authorized for  detection and/or diagnosis of SARS-CoV-2 by FDA under an Emergency Use Authorization (EUA).  This EUA will remain in effect (meaning this test can be used) for the duration of the COVID-19 declaration under Section 564(b)(1) of the Act, 21 U.S.C. section 360bbb-3(b)(1), unless the authorization is terminated or revoked sooner.  Performed at [facility], [address] [place], [place] [address]   Culture, blood (x 2)     Status: None (Preliminary result)   Collection Time: 12/10/22 12:50 AM   Specimen: BLOOD  Result Value Ref Range Status   Specimen Description BLOOD LEFT ASSIST CONTROL  Final   Special Requests   Final    BOTTLES DRAWN AEROBIC AND ANAEROBIC Blood Culture results may not be optimal due to an excessive volume of blood received in culture bottles   Culture   Final    NO GROWTH < 12 HOURS Performed at [facility], [address] [place], [place] [address]    Report Status PENDING  Incomplete  Culture, blood (x 2)     Status: None (Preliminary result)   Collection Time: 12/10/22 12:51 AM   Specimen: BLOOD  Result Value Ref Range Status   Specimen Description BLOOD LEFT ARM  Final   Special Requests   Final    BOTTLES DRAWN AEROBIC AND ANAEROBIC Blood Culture adequate volume   Culture   Final    NO GROWTH < 12 HOURS Performed at [facility], [address] [place], [place] [address]    Report Status PENDING  Incomplete    Labs: CBC: Recent Labs  Lab 12/09/22 2101 12/10/22 0305  WBC 9.4 9.6  HGB 10.3* 8.9*  HCT 33.6* 29.0*  MCV 76.9* 77.1*  PLT 373 331   Basic Metabolic Panel: Recent Labs  Lab 12/09/22 2100 12/09/22 2101 12/10/22 0305  NA  --  135 138  K  --  3.0* 3.2*  CL  --  98 104  CO2  --  28 25  GLUCOSE  --  108* 118*  BUN  --  8 8  CREATININE  --  0.80 0.71  CALCIUM  --  9.1 8.4*  MG 2.1  --   --   PHOS 2.8  --   --    Liver Function Tests: Recent Labs  Lab 12/09/22 2101  AST 14*  ALT 9   ALKPHOS 51  BILITOT 0.6  PROT 8.1  ALBUMIN 4.0    Discharge time spent: greater than 30 minutes.  Signed: Enedina Finner, MD [facility] 12/10/2022

## 2022-12-10 NOTE — TOC CM/SW Note (Signed)
Transition of Care [facility]) - Inpatient Brief Assessment   Patient Details  Name: COSTELLA SCHWARZ MRN: [id number] Date of Birth: 28-Sep-1995  Transition of Care [facility]) CM/SW Contact:    Allena Katz, LCSW Phone Number: 12/10/2022, 8:39 AM   Clinical Narrative:  Pt actively discharging. PCP list added to AVS.    Transition of Care Asessment: Insurance and Status: Insurance coverage has been reviewed Patient has primary care physician: No (PCP list added to AVS) Home environment has been reviewed: [address] [place] [address] Prior level of function:: independent Prior/Current Home Services: No current home services Social Determinants of Health Reivew: SDOH reviewed no interventions necessary Readmission risk has been reviewed: Yes Transition of care needs: no transition of care needs at this time

## 2022-12-10 NOTE — Discharge Instructions (Addendum)
Some PCP options in [place] area- not a comprehensive list  [facility]- [phone] [facility]- [phone] [facility]- [phone] [facility]- [phone] [facility]- [phone] [facility]- [phone]  or [facility] Physician Referral Line [phone]   Pt advised to establish PCP in the area
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
## 2022-12-12 LAB — URINE CULTURE: Culture: 70000 — AB

## 2022-12-15 LAB — CULTURE, BLOOD (ROUTINE X 2)
Culture: NO GROWTH
Culture: NO GROWTH
Special Requests: ADEQUATE

## 2022-12-25 DIAGNOSIS — Z419 Encounter for procedure for purposes other than remedying health state, unspecified: Secondary | ICD-10-CM | POA: Diagnosis not present

## 2022-12-28 ENCOUNTER — Other Ambulatory Visit: Payer: Self-pay

## 2022-12-28 DIAGNOSIS — K121 Other forms of stomatitis: Secondary | ICD-10-CM | POA: Insufficient documentation

## 2022-12-28 DIAGNOSIS — K130 Diseases of lips: Secondary | ICD-10-CM | POA: Diagnosis present

## 2022-12-28 NOTE — ED Triage Notes (Signed)
Pt states she noticed a bump come up on the right corner of her upper lip, pt denies pain to area. No swelling.

## 2022-12-29 ENCOUNTER — Emergency Department
Admission: EM | Admit: 2022-12-29 | Discharge: 2022-12-29 | Disposition: A | Discharge status: Home / Self Care | Payer: Medicaid Other | Attending: Emergency Medicine | Admitting: Emergency Medicine

## 2022-12-29 DIAGNOSIS — K121 Other forms of stomatitis: Secondary | ICD-10-CM

## 2022-12-29 MED ORDER — MUPIROCIN CALCIUM 2 % EX CREA
TOPICAL_CREAM | Freq: Once | CUTANEOUS | Status: AC
  Administered 2022-12-29: 1 via TOPICAL
  Filled 2022-12-29: qty 15

## 2022-12-29 NOTE — Discharge Instructions (Signed)
Apply thin layer of Mupirocin to affected area twice daily for 3 to 5 days.  Return to the ER for worsening symptoms, persistent vomiting, difficulty breathing or other concerns.

## 2022-12-29 NOTE — ED Provider Notes (Signed)
[facility] Provider Note    Event Date/Time   First MD Initiated Contact with Patient 12/29/22 0012     (approximate)   History   Bump on lip   HPI  Anita Robinson is a 27 y.o. female who presents to the ED from home with a chief complaint of bump on the corner of her right upper lip that she noticed today.  Patient had been taking Diflucan for vaginal yeast infection and noted a itchy lesion to her right upper lip.  Denies vesicles or drainage.  Denies STD concerns.     Past Medical History   Past Medical History:  Diagnosis Date   Anemia    on Iron     Active Problem List   Patient Active Problem List   Diagnosis Date Noted   Acute pyelonephritis 12/09/2022   Hypokalemia 12/09/2022   Supervision of other normal pregnancy, antepartum 02/10/2021   IDA (iron deficiency anemia) 10/09/2020   Pregnancy 10/07/2020   VBAC, delivered, current hospitalization 10/07/2020   GBS bacteriuria 10/03/2020     Past Surgical History   Past Surgical History:  Procedure Laterality Date   CESAREAN SECTION N/A 12/03/2019   Procedure: CESAREAN SECTION;  Surgeon: Steva Ready, DO;  Location: [facility] LD ORS;  Service: Obstetrics;  Laterality: N/A;   CLOSED REDUCTION MANDIBLE Left 01/28/2022   Procedure: CLOSED REDUCTION OF LEFT CONDYLOID FRACTURE WITH FIXATION;  Surgeon: Vivia Ewing, DMD;  Location: [facility] OR;  Service: Oral Surgery;  Laterality: Left;   CORONARY ARTERY BYPASS GRAFT       Home Medications   Prior to Admission medications   Medication Sig Start Date End Date Taking? Authorizing Provider  acetaminophen (TYLENOL) 500 MG tablet Take 1,000 mg by mouth every 6 (six) hours as needed (pain.).    [provider]  Prenatal Vit-Fe Fumarate-FA (PRENATAL MULTIVITAMIN) TABS tablet Take 1 tablet by mouth daily at 12 noon.    [provider]     Allergies  Patient has no known allergies.   Family History   Family History  Problem  Relation Age of Onset   Asthma Mother    Asthma Sister    Asthma Brother    Alcohol abuse Neg Hx    Arthritis Neg Hx    Birth defects Neg Hx    Cancer Neg Hx    COPD Neg Hx    Depression Neg Hx    Diabetes Neg Hx    Drug abuse Neg Hx    Early death Neg Hx    Hearing loss Neg Hx    Heart disease Neg Hx    Hyperlipidemia Neg Hx    Hypertension Neg Hx    Kidney disease Neg Hx    Learning disabilities Neg Hx    Mental illness Neg Hx    Mental retardation Neg Hx    Miscarriages / Stillbirths Neg Hx    Stroke Neg Hx    Vision loss Neg Hx    Varicose Veins Neg Hx      Physical Exam  Triage Vital Signs: ED Triage Vitals  Encounter Vitals Group     BP 12/28/22 2251 119/80     Systolic BP Percentile --      Diastolic BP Percentile --      Pulse Rate 12/28/22 2251 71     Resp 12/28/22 2251 18     Temp 12/28/22 2251 98.9 F (37.2 C)     Temp src --  SpO2 12/28/22 2251 98 %     Weight 12/28/22 2251 158 lb (71.7 kg)     Height 12/28/22 2251 5\' 5"  (1.651 m)     Head Circumference --      Peak Flow --      Pain Score 12/28/22 2251 0     Pain Loc --      Pain Education --      Exclude from Growth Chart --     Updated Vital Signs: BP 119/80 (BP Location: Left Arm)   Pulse 71   Temp 98.9 F (37.2 C)   Resp 18   Ht 5\' 5"  (1.651 m)   Wt 71.7 kg   LMP  (LMP Unknown)   SpO2 98%   BMI 26.29 kg/m    General: Awake, no distress.  CV:  Good peripheral perfusion.  Resp:  Normal effort.  Abd:  No distention.  Other:  Tiny patch of dry, bumpy lesion to corner of right upper lip.  No vesicles.   ED Results / Procedures / Treatments  Labs (all labs ordered are listed, but only abnormal results are displayed) Labs Reviewed - No data to display   EKG  None   RADIOLOGY None   Official radiology report(s): No results found.   PROCEDURES:  Critical Care performed: No  Procedures   MEDICATIONS ORDERED IN ED: Medications  mupirocin cream (BACTROBAN) 2  % (has no administration in time range)     IMPRESSION / MDM / ASSESSMENT AND PLAN / ED COURSE  I reviewed the triage vital signs and the nursing notes.                             27 year old female presenting with lesion on right upper lip.  Does not appear herpetic in nature.  Will apply Mupirocin ointment and patient will follow-up with her PCP as needed.  Strict return precautions given.  Patient verbalizes understanding and agrees with plan of care.  Patient's presentation is most consistent with acute, uncomplicated illness.   FINAL CLINICAL IMPRESSION(S) / ED DIAGNOSES   Final diagnoses:  Stomatitis     Rx / DC Orders   ED Discharge Orders     None        Note:  This document was prepared using Dragon voice recognition software and may include unintentional dictation errors.   Irean Hong, MD 12/29/22 [phone]

## 2023-01-24 DIAGNOSIS — Z419 Encounter for procedure for purposes other than remedying health state, unspecified: Secondary | ICD-10-CM | POA: Diagnosis not present

## 2023-02-24 DIAGNOSIS — Z419 Encounter for procedure for purposes other than remedying health state, unspecified: Secondary | ICD-10-CM | POA: Diagnosis not present

## 2023-03-27 DIAGNOSIS — Z419 Encounter for procedure for purposes other than remedying health state, unspecified: Secondary | ICD-10-CM | POA: Diagnosis not present

## 2023-04-24 DIAGNOSIS — Z419 Encounter for procedure for purposes other than remedying health state, unspecified: Secondary | ICD-10-CM | POA: Diagnosis not present

## 2023-05-19 ENCOUNTER — Encounter: Admitting: Obstetrics

## 2023-06-05 DIAGNOSIS — Z419 Encounter for procedure for purposes other than remedying health state, unspecified: Secondary | ICD-10-CM | POA: Diagnosis not present

## 2023-06-10 ENCOUNTER — Encounter: Payer: Self-pay | Admitting: Obstetrics and Gynecology

## 2023-06-10 ENCOUNTER — Ambulatory Visit (INDEPENDENT_AMBULATORY_CARE_PROVIDER_SITE_OTHER): Admitting: Obstetrics and Gynecology

## 2023-06-10 VITALS — BP 120/82 | HR 72 | Ht 65.0 in | Wt 168.1 lb

## 2023-06-10 DIAGNOSIS — Z7689 Persons encountering health services in other specified circumstances: Secondary | ICD-10-CM

## 2023-06-10 DIAGNOSIS — Z01419 Encounter for gynecological examination (general) (routine) without abnormal findings: Secondary | ICD-10-CM

## 2023-06-10 DIAGNOSIS — D251 Intramural leiomyoma of uterus: Secondary | ICD-10-CM | POA: Diagnosis not present

## 2023-06-10 DIAGNOSIS — N96 Recurrent pregnancy loss: Secondary | ICD-10-CM

## 2023-06-10 NOTE — Progress Notes (Signed)
 Patient presents today to discuss history of fibroids and miscarriages. She reports experiencing 3 miscarriages over the past 7 months and would like to become pregnant again. History of 3.9 intramural fibroid, imaging on 10/26/22.

## 2023-06-10 NOTE — Progress Notes (Signed)
 HPI:      [name] is a 28 y.o. [id number] who LMP was Patient's last menstrual period was 05/30/2023.  Subjective:   She presents today with complaint of 3 miscarriages in the last several months.  2 of these miscarriages were documented by ultrasound. She has had multiple successful pregnancies.  3 of these pregnancies were with the same partner as current. She desires pregnancy within the next year but is understandably concerned regarding the possibility of undergoing miscarriage. She reports normal regular cycles. Of significant note, she has a known 3 cm myometrial fibroid.    Hx: The following portions of the patient's history were reviewed and updated as appropriate:             She  has a past medical history of Anemia. She does not have any pertinent problems on file. She  has a past surgical history that includes Cesarean section (N/A, 12/03/2019); Closed reduction mandible (Left, 01/28/2022); and Coronary artery bypass graft. Her family history includes Asthma in her brother, mother, and sister. She  reports that she has never smoked. She has never been exposed to tobacco smoke. She has never used smokeless tobacco. She reports that she does not currently use alcohol. She reports that she does not use drugs. She has a current medication list which includes the following prescription(s): acetaminophen, ashwagandha, and prenatal multivitamin. She has no known allergies.       Review of Systems:  Review of Systems  Constitutional: Denied constitutional symptoms, night sweats, recent illness, fatigue, fever, insomnia and weight loss.  Eyes: Denied eye symptoms, eye pain, photophobia, vision change and visual disturbance.  Ears/Nose/Throat/Neck: Denied ear, nose, throat or neck symptoms, hearing loss, nasal discharge, sinus congestion and sore throat.  Cardiovascular: Denied cardiovascular symptoms, arrhythmia, chest pain/pressure, edema, exercise intolerance, orthopnea and  palpitations.  Respiratory: Denied pulmonary symptoms, asthma, pleuritic pain, productive sputum, cough, dyspnea and wheezing.  Gastrointestinal: Denied, gastro-esophageal reflux, melena, nausea and vomiting.  Genitourinary: Denied genitourinary symptoms including symptomatic vaginal discharge, pelvic relaxation issues, and urinary complaints.  Musculoskeletal: Denied musculoskeletal symptoms, stiffness, swelling, muscle weakness and myalgia.  Dermatologic: Denied dermatology symptoms, rash and scar.  Neurologic: Denied neurology symptoms, dizziness, headache, neck pain and syncope.  Psychiatric: Denied psychiatric symptoms, anxiety and depression.  Endocrine: Denied endocrine symptoms including hot flashes and night sweats.   Meds:   Current Outpatient Medications on File Prior to Visit  Medication Sig Dispense Refill   acetaminophen (TYLENOL) 500 MG tablet Take 1,000 mg by mouth every 6 (six) hours as needed (pain.).     ASHWAGANDHA PO Take by mouth.     Prenatal Vit-Fe Fumarate-FA (PRENATAL MULTIVITAMIN) TABS tablet Take 1 tablet by mouth daily at 12 noon.     No current facility-administered medications on file prior to visit.      Objective:     Vitals:   06/10/23 1005  BP: 120/82  Pulse: 72   Filed Weights   06/10/23 1005  Weight: 168 lb 1.6 oz (76.2 kg)              Ultrasound results from the past reviewed.          Assessment:    [id number] Patient Active Problem List   Diagnosis Date Noted   Acute pyelonephritis 12/09/2022   Hypokalemia 12/09/2022   Supervision of other normal pregnancy, antepartum 02/10/2021   IDA (iron deficiency anemia) 10/09/2020   Pregnancy 10/07/2020   VBAC, delivered, current hospitalization 10/07/2020   GBS  bacteriuria 10/03/2020     1. Establishing care with new doctor, encounter for   2. Intramural uterine fibroid   3. Recurrent pregnancy loss        Plan:            1.  We have discussed recurrent miscarriage in detail.   I recommended blood workup to start.  Have purposely not ordered karyotype at this time because her previous successful pregnancies with the same partner.  2.  If blood work normal-consider HSG for confirmation of normal endometrial cavity.  3.  Small myometrial fibroid an unlike clear source for recurrent miscarriage but will refer to infertility if remainder of workup negative for possible myomectomy.  Orders Orders Placed This Encounter  Procedures   Lupus anticoagulant panel   Cardiolipin antibodies, IgM+IgG   Beta-2-glycoprotein i abs, IgG/M/A   HgB A1c   TSH    No orders of the defined types were placed in this encounter.     F/U  Return for We will contact her with any abnormal test results.  Delice Felt, M.D. 06/10/2023 10:51 AM

## 2023-06-12 LAB — CARDIOLIPIN ANTIBODIES, IGM+IGG
Anticardiolipin IgG: <9 GPL U/mL (ref 0–14)
Anticardiolipin IgM: <9 [MPL'U]/mL (ref 0–12)

## 2023-06-12 LAB — TSH: TSH: 0.657 u[IU]/mL (ref 0.450–4.500)

## 2023-06-12 LAB — BETA-2-GLYCOPROTEIN I ABS, IGG/M/A
Beta-2 Glyco 1 IgA: <9 GPI IgA units (ref 0–25)
Beta-2 Glyco 1 IgM: <9 GPI IgM units (ref 0–32)
Beta-2 Glyco I IgG: <9 GPI IgG units (ref 0–20)

## 2023-06-12 LAB — LUPUS ANTICOAGULANT PANEL
Dilute Viper Venom Time: 36.2 s (ref 0.0–47.0)
PTT Lupus Anticoagulant: 35.5 s (ref 0.0–43.5)

## 2023-06-12 LAB — HEMOGLOBIN A1C
Est. average glucose Bld gHb Est-mCnc: 114 mg/dL
Hgb A1c MFr Bld: 5.6 % (ref 4.8–5.6)

## 2023-06-29 ENCOUNTER — Other Ambulatory Visit: Payer: Self-pay

## 2023-06-29 DIAGNOSIS — N96 Recurrent pregnancy loss: Secondary | ICD-10-CM

## 2023-07-05 ENCOUNTER — Ambulatory Visit

## 2023-07-05 DIAGNOSIS — Z419 Encounter for procedure for purposes other than remedying health state, unspecified: Secondary | ICD-10-CM | POA: Diagnosis not present

## 2023-07-27 ENCOUNTER — Other Ambulatory Visit: Payer: Self-pay

## 2023-07-27 DIAGNOSIS — N96 Recurrent pregnancy loss: Secondary | ICD-10-CM

## 2023-07-27 DIAGNOSIS — D251 Intramural leiomyoma of uterus: Secondary | ICD-10-CM

## 2023-07-30 ENCOUNTER — Ambulatory Visit
Admission: RE | Admit: 2023-07-30 | Discharge: 2023-07-30 | Disposition: A | Discharge status: Home / Self Care | Source: Ambulatory Visit | Attending: Obstetrics and Gynecology | Admitting: Obstetrics and Gynecology

## 2023-07-30 DIAGNOSIS — N96 Recurrent pregnancy loss: Secondary | ICD-10-CM | POA: Diagnosis not present

## 2023-07-30 DIAGNOSIS — N979 Female infertility, unspecified: Secondary | ICD-10-CM | POA: Diagnosis not present

## 2023-07-30 MED ORDER — IOHEXOL 300 MG/ML  SOLN
20.0000 mL | Freq: Once | INTRAMUSCULAR | Status: AC | PRN
  Administered 2023-07-30: 20 mL

## 2023-07-30 NOTE — Procedures (Signed)
    Hysterosalpingogram Post-Procedure Note  Pre-procedure Diagnosis: Infertility  Post-operative Diagnosis: Same  Indications: Infertility  Procedure Details:   Consent: Informed consent was obtained. Risks of the procedure were discussed including: infection, bleeding, pain, and allergic reaction to dye.  A speculum was inserted into the patient's vagina.  The cervix was cleansed with Betadine. The cervical opening was cannulated per standard procedure.  Then under fluoroscopic guidance, water soluble contrast was injected in a retrograde fashion. The cavity of the uterus appeared to be normally shaped after filling. Spillage was noted from both of the fallopian tubes.  X-ray imaging were captured. The cannula was then removed from the uterine cavity and the speculum was removed.  The patient tolerated the procedure well.   Findings:   The uterus fills normally, with no evidence of contour abnormality, filling defect, septum, mass, or bicornuate configuration.   Both fallopian tubes are patent with normal rapid spillage of contrast into the peritoneum.    Fluoroscopy time: 1 minute.   Complications: None     Condition: Stable  Plan: Patient to f/u in clinic in 1-2 weeks for further management of infertility.    Delice Felt, M.D. 07/30/2023 9:36 AM Luster Salters Gretel Leaven, MD

## 2023-08-05 DIAGNOSIS — Z419 Encounter for procedure for purposes other than remedying health state, unspecified: Secondary | ICD-10-CM | POA: Diagnosis not present

## 2023-08-10 ENCOUNTER — Telehealth: Admitting: Obstetrics and Gynecology

## 2023-08-10 ENCOUNTER — Telehealth: Payer: Self-pay | Admitting: Obstetrics and Gynecology

## 2023-08-10 NOTE — Telephone Encounter (Signed)
 Contacted the patient via phone. The patient was scheduled for 6/17 with Dr Luster Salters for video visit. There was an scheduling error and link wasn't sent to the patient. I left message asking the patient to contact our office for rescheduling.

## 2023-08-19 ENCOUNTER — Telehealth (INDEPENDENT_AMBULATORY_CARE_PROVIDER_SITE_OTHER): Admitting: Obstetrics and Gynecology

## 2023-08-19 ENCOUNTER — Encounter: Payer: Self-pay | Admitting: Obstetrics and Gynecology

## 2023-08-19 DIAGNOSIS — N96 Recurrent pregnancy loss: Secondary | ICD-10-CM | POA: Diagnosis not present

## 2023-08-19 NOTE — Progress Notes (Signed)
 Virtual Visit via Video Note  I connected with Anita Robinson on 08/19/23 at  7:35 AM EDT by video and verified that I was speaking with the correct person using two identifiers.    Anita Robinson is a 28 y.o. [id number] who LMP was No LMP recorded. I discussed the limitations, risks, security and privacy concerns of performing an evaluation and management service by video and the availability of in person appointments. I also discussed with the patient that there may be a patient responsible charge related to this service. The patient expressed understanding and agreed to proceed.  Location of patient:  Home  Patient gave explicit verbal consent for video visit:  YES  Location of provider:  [facility] office  Persons other than physician and patient involved in provider conference:  None   Subjective:   History of Present Illness:    She has a history of recurrent miscarriage.  She underwent an HSG because she has a fundal fibroid and there was a question regarding its impingement upon the endometrium.  Hx: The following portions of the patient's history were reviewed and updated as appropriate:             She  has a past medical history of Anemia. She does not have any pertinent problems on file. She  has a past surgical history that includes Cesarean section (N/A, 12/03/2019); Closed reduction mandible (Left, 01/28/2022); and Coronary artery bypass graft. Her family history includes Asthma in her brother, mother, and sister. She  reports that she has never smoked. She has never been exposed to tobacco smoke. She has never used smokeless tobacco. She reports that she does not currently use alcohol. She reports that she does not use drugs. She has a current medication list which includes the following prescription(s): acetaminophen , ashwagandha, and prenatal multivitamin. She has no known allergies.       Review of Systems:  Review of Systems  Constitutional: Denied constitutional  symptoms, night sweats, recent illness, fatigue, fever, insomnia and weight loss.  Eyes: Denied eye symptoms, eye pain, photophobia, vision change and visual disturbance.  Ears/Nose/Throat/Neck: Denied ear, nose, throat or neck symptoms, hearing loss, nasal discharge, sinus congestion and sore throat.  Cardiovascular: Denied cardiovascular symptoms, arrhythmia, chest pain/pressure, edema, exercise intolerance, orthopnea and palpitations.  Respiratory: Denied pulmonary symptoms, asthma, pleuritic pain, productive sputum, cough, dyspnea and wheezing.  Gastrointestinal: Denied, gastro-esophageal reflux, melena, nausea and vomiting.  Genitourinary: Denied genitourinary symptoms including symptomatic vaginal discharge, pelvic relaxation issues, and urinary complaints.  Musculoskeletal: Denied musculoskeletal symptoms, stiffness, swelling, muscle weakness and myalgia.  Dermatologic: Denied dermatology symptoms, rash and scar.  Neurologic: Denied neurology symptoms, dizziness, headache, neck pain and syncope.  Psychiatric: Denied psychiatric symptoms, anxiety and depression.  Endocrine: Denied endocrine symptoms including hot flashes and night sweats.   Meds:   Current Outpatient Medications on File Prior to Visit  Medication Sig Dispense Refill   acetaminophen  (TYLENOL ) 500 MG tablet Take 1,000 mg by mouth every 6 (six) hours as needed (pain.).     ASHWAGANDHA PO Take by mouth.     Prenatal Vit-Fe Fumarate-FA (PRENATAL MULTIVITAMIN) TABS tablet Take 1 tablet by mouth daily at 12 noon.     No current facility-administered medications on file prior to visit.    Assessment:    [id number] Patient Active Problem List   Diagnosis Date Noted   Acute pyelonephritis 12/09/2022   Hypokalemia 12/09/2022   Supervision of other normal pregnancy, antepartum 02/10/2021   IDA (iron  deficiency  anemia) 10/09/2020   Pregnancy 10/07/2020   VBAC, delivered, current hospitalization 10/07/2020   GBS bacteriuria  10/03/2020     1. Recurrent pregnancy loss     HSG is normal with normal endometrium, open fallopian tubes and good spill.  Plan:            1.  As previously discussed, we have again reviewed these findings and I believe the next step should be referral to REI.  I will refer her to [facility] and Dr. Yalcinkaya. Orders Orders Placed This Encounter  Procedures   Ambulatory referral to Infertility    No orders of the defined types were placed in this encounter.     F/U  No follow-ups on file.   Anita Robinson, M.D. 08/19/2023 8:14 AM

## 2023-08-27 ENCOUNTER — Emergency Department
Admission: EM | Admit: 2023-08-27 | Discharge: 2023-08-27 | Disposition: A | Discharge status: Home / Self Care | Attending: Emergency Medicine | Admitting: Emergency Medicine

## 2023-08-27 ENCOUNTER — Emergency Department

## 2023-08-27 ENCOUNTER — Other Ambulatory Visit: Payer: Self-pay

## 2023-08-27 DIAGNOSIS — E041 Nontoxic single thyroid nodule: Secondary | ICD-10-CM | POA: Diagnosis not present

## 2023-08-27 DIAGNOSIS — K047 Periapical abscess without sinus: Secondary | ICD-10-CM | POA: Diagnosis not present

## 2023-08-27 DIAGNOSIS — L03211 Cellulitis of face: Secondary | ICD-10-CM | POA: Diagnosis not present

## 2023-08-27 DIAGNOSIS — R220 Localized swelling, mass and lump, head: Secondary | ICD-10-CM | POA: Diagnosis not present

## 2023-08-27 LAB — CBC WITH DIFFERENTIAL/PLATELET
Abs Immature Granulocytes: 0.04 K/uL (ref 0.00–0.07)
Basophils Absolute: 0 K/uL (ref 0.0–0.1)
Basophils Relative: 0 %
Eosinophils Absolute: 0 K/uL (ref 0.0–0.5)
Eosinophils Relative: 0 %
HCT: 34.5 % — ABNORMAL LOW (ref 36.0–46.0)
Hemoglobin: 10.3 g/dL — ABNORMAL LOW (ref 12.0–15.0)
Immature Granulocytes: 0 %
Lymphocytes Relative: 10 %
Lymphs Abs: 1 K/uL (ref 0.7–4.0)
MCH: 23.5 pg — ABNORMAL LOW (ref 26.0–34.0)
MCHC: 29.9 g/dL — ABNORMAL LOW (ref 30.0–36.0)
MCV: 78.6 fL — ABNORMAL LOW (ref 80.0–100.0)
Monocytes Absolute: 0.5 K/uL (ref 0.1–1.0)
Monocytes Relative: 4 %
Neutro Abs: 9.2 K/uL — ABNORMAL HIGH (ref 1.7–7.7)
Neutrophils Relative %: 86 %
Platelets: 273 K/uL (ref 150–400)
RBC: 4.39 MIL/uL (ref 3.87–5.11)
RDW: 13.1 % (ref 11.5–15.5)
WBC: 10.7 K/uL — ABNORMAL HIGH (ref 4.0–10.5)
nRBC: 0 % (ref 0.0–0.2)

## 2023-08-27 LAB — BASIC METABOLIC PANEL WITH GFR
Anion gap: 10 (ref 5–15)
BUN: 10 mg/dL (ref 6–20)
CO2: 24 mmol/L (ref 22–32)
Calcium: 9.1 mg/dL (ref 8.9–10.3)
Chloride: 105 mmol/L (ref 98–111)
Creatinine, Ser: 0.64 mg/dL (ref 0.44–1.00)
GFR, Estimated: >60 mL/min (ref >60)
Glucose, Bld: 104 mg/dL — ABNORMAL HIGH (ref 70–99)
Potassium: 3.6 mmol/L (ref 3.5–5.1)
Sodium: 139 mmol/L (ref 135–145)

## 2023-08-27 MED ORDER — CEPHALEXIN 500 MG PO CAPS: 500.0000 mg | ORAL_CAPSULE | Freq: Four times a day (QID) | ORAL | 0 refills | Status: AC

## 2023-08-27 MED ORDER — IOHEXOL 300 MG/ML  SOLN
75.0000 mL | Freq: Once | INTRAMUSCULAR | Status: AC | PRN
  Administered 2023-08-27: 75 mL via INTRAVENOUS

## 2023-08-27 MED ORDER — ACETAMINOPHEN 500 MG PO TABS
1000.0000 mg | ORAL_TABLET | Freq: Once | ORAL | Status: AC
  Administered 2023-08-27: 1000 mg via ORAL
  Filled 2023-08-27: qty 2

## 2023-08-27 MED ORDER — CEPHALEXIN 500 MG PO CAPS
500.0000 mg | ORAL_CAPSULE | Freq: Once | ORAL | Status: AC
  Administered 2023-08-27: 500 mg via ORAL
  Filled 2023-08-27: qty 1
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
## 2023-08-27 NOTE — ED Notes (Signed)
 Pt is CAOx4, breathing normally, and normal in color. Pt is complaining of right facial swelling and pain x2 days with an unknown cause or reason why. Denies any trauma. Pt in NAD at this time.
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
## 2023-09-04 DIAGNOSIS — Z419 Encounter for procedure for purposes other than remedying health state, unspecified: Secondary | ICD-10-CM | POA: Diagnosis not present

## 2023-10-05 DIAGNOSIS — H5213 Myopia, bilateral: Secondary | ICD-10-CM | POA: Diagnosis not present

## 2023-10-05 DIAGNOSIS — Z419 Encounter for procedure for purposes other than remedying health state, unspecified: Secondary | ICD-10-CM | POA: Diagnosis not present

## 2023-10-14 NOTE — Progress Notes (Unsigned)
    NURSE VISIT NOTE  Subjective:    Patient ID: Anita Robinson, female    DOB: 06/24/1995, 28 y.o.   MRN: [id number]  HPI  Patient is a 28 y.o. [id number] female who presents for evaluation of amenorrhea. She believes she could be pregnant. Pregnancy is desired. Sexual Activity: single partner, contraception: none. Current symptoms also include: breast tenderness, fatigue, nausea, and positive home pregnancy test. Last period was normal.    Objective:    BP 106/69   Pulse 76   Ht 5' 5 (1.651 m)   Wt 173 lb 12.8 oz (78.8 kg)   LMP 09/13/2023 (Exact Date)   BMI 28.92 kg/m   Lab Review  Results for orders placed or performed in visit on 10/15/23  POCT urine pregnancy  Result Value Ref Range   Preg Test, Ur Positive (A) Negative    Assessment:   1. Absence of menstruation   2. Encounter for antenatal screening for uncertain dates   3. Recurrent pregnancy loss     Plan:   Pregnancy Test: Positive  Estimated Date of Delivery: 06/19/24 BP Cuff Measurement taken. Cuff Size Adult Small Encouraged well-balanced diet, plenty of rest when needed, pre-natal vitamins daily and walking for exercise.  Discussed self-help for nausea, avoiding OTC medications until consulting provider or pharmacist, other than Tylenol  as needed, minimal caffeine (1-2 cups daily) and avoiding alcohol.   She will schedule her nurse visit @ 7-[redacted] wks pregnant, u/s for dating @10  wk, and NOB visit at [redacted] wk pregnant.    Feel free to call with any questions.  Serial Beta Quant ordered per  Harlene Cisco, CNM./ Rollo Delana LATHER   Rollo JINNY Delana, CMA

## 2023-10-15 ENCOUNTER — Ambulatory Visit

## 2023-10-15 VITALS — BP 106/69 | HR 76 | Ht 65.0 in | Wt 173.8 lb

## 2023-10-15 DIAGNOSIS — Z3201 Encounter for pregnancy test, result positive: Secondary | ICD-10-CM | POA: Diagnosis not present

## 2023-10-15 DIAGNOSIS — Z3687 Encounter for antenatal screening for uncertain dates: Secondary | ICD-10-CM

## 2023-10-15 DIAGNOSIS — N912 Amenorrhea, unspecified: Secondary | ICD-10-CM

## 2023-10-15 DIAGNOSIS — N96 Recurrent pregnancy loss: Secondary | ICD-10-CM

## 2023-10-15 LAB — POCT URINE PREGNANCY: Preg Test, Ur: POSITIVE — AB

## 2023-10-15 NOTE — Patient Instructions (Signed)

## 2023-10-18 ENCOUNTER — Other Ambulatory Visit

## 2023-10-18 ENCOUNTER — Other Ambulatory Visit: Payer: Self-pay | Admitting: Certified Nurse Midwife

## 2023-10-18 DIAGNOSIS — N96 Recurrent pregnancy loss: Secondary | ICD-10-CM

## 2023-10-18 LAB — HUMAN CHORIONIC GONADOTROPIN(HCG),B-SUBUNIT,QUANTITATIVE): HCG, Beta Chain, Quant, S: 981 m[IU]/mL

## 2023-10-18 LAB — PROGESTERONE: Progesterone: 16.1 ng/mL

## 2023-10-18 MED ORDER — PROGESTERONE 200 MG PO CAPS: 400.0000 mg | ORAL_CAPSULE | Freq: Two times a day (BID) | ORAL | 2 refills | Status: DC

## 2023-10-18 NOTE — Progress Notes (Signed)
 TC to patient to review progesterone  results and mixed evidence on supplementation to prevent loss, will start PV progesterone . Ultrasound to confirm viability at 6-7w EGA recommended & accepted, orders placed.

## 2023-10-19 LAB — HUMAN CHORIONIC GONADOTROPIN(HCG),B-SUBUNIT,QUANTITATIVE): HCG, Beta Chain, Quant, S: 2279 m[IU]/mL

## 2023-11-05 ENCOUNTER — Telehealth (INDEPENDENT_AMBULATORY_CARE_PROVIDER_SITE_OTHER)

## 2023-11-05 DIAGNOSIS — N96 Recurrent pregnancy loss: Secondary | ICD-10-CM | POA: Insufficient documentation

## 2023-11-05 DIAGNOSIS — Z3689 Encounter for other specified antenatal screening: Secondary | ICD-10-CM

## 2023-11-05 DIAGNOSIS — Z419 Encounter for procedure for purposes other than remedying health state, unspecified: Secondary | ICD-10-CM | POA: Diagnosis not present

## 2023-11-05 DIAGNOSIS — Z3480 Encounter for supervision of other normal pregnancy, unspecified trimester: Secondary | ICD-10-CM | POA: Insufficient documentation

## 2023-11-05 DIAGNOSIS — Z98891 History of uterine scar from previous surgery: Secondary | ICD-10-CM | POA: Insufficient documentation

## 2023-11-05 NOTE — Progress Notes (Addendum)
 New OB Intake  I connected with  Anita Robinson Fellows on 11/05/23 at 10:15 AM EDT by MyChart Video Visit and verified that I am speaking with the correct person using two identifiers. Nurse is located at [facility] and pt is located inside parked car.  I discussed the limitations, risks, security and privacy concerns of performing an evaluation and management service by telephone and the availability of in person appointments. I also discussed with the patient that there may be a patient responsible charge related to this service. The patient expressed understanding and agreed to proceed.  I explained I am completing New OB Intake today. We discussed her EDD of 06/19/24 that is based on LMP of 09/13/23. Pt is G9/P4. I reviewed her allergies, medications, Medical/Surgical/OB history, and appropriate screenings. There are cats in the home: no. Based on history, this is a/an pregnancy uncomplicated . Her obstetrical history is significant for N/A.  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  Anatomy US  Explained first scheduled US  will be 11/26/23. Anatomy US  will be scheduled around [redacted] weeks gestational age.  Labs Discussed genetic screening with patient. Patient desires genetic testing to be drawn at new OB visit. Discussed possible labs to be drawn at new OB appointment.  COVID Vaccine Patient has not had COVID vaccine.   Social Determinants of Health Food Insecurity: denies food insecurity  Transportation: Patient denies transportation  needs. Childcare: Discussed no children allowed at ultrasound appointments.   First visit review I reviewed new OB appt with pt. I explained she will have blood work and pap smear/pelvic exam if indicated. Explained pt will be seen by Eleanor Canny, CNM at first visit; encounter routed to appropriate provider.   Beola Skeens, CMA 11/05/2023  10:50 AM

## 2023-11-05 NOTE — Patient Instructions (Signed)
 First Trimester of Pregnancy  The first trimester of pregnancy starts on the first day of your last monthly period until the end of week 13. This is months 1 through 3 of pregnancy. A week after a sperm fertilizes an egg, the egg will implant into the wall of the uterus and begin to develop into a baby. Body changes during your first trimester Your body goes through many changes during pregnancy. The changes usually return to normal after your baby is born. Physical changes Your breasts may grow larger and may hurt. The area around your nipples may get darker. Your periods will stop. Your hair and nails may grow faster. You may pee more often. Health changes You may tire easily. Your gums may bleed and may be sensitive when you brush and floss. You may not feel hungry. You may have heartburn. You may throw up or feel like you may throw up. You may want to eat some foods, but not others. You may have headaches. You may have trouble pooping (constipation). Other changes Your emotions may change from day to day. You may have more dreams. Follow these instructions at home: Medicines Talk to your health care provider if you're taking medicines. Ask if the medicines are safe to take during pregnancy. Your provider may change the medicines that you take. Do not take any medicines unless told to by your provider. Take a prenatal vitamin that has at least 600 micrograms (mcg) of folic acid. Do not use herbal medicines, illegal substances, or medicines that are not approved by your provider. Eating and drinking While you're pregnant your body needs extra food for your growing baby. Talk with your provider about what to eat while pregnant. Activity Most women are able to exercise during pregnancy. Exercises may need to change as your pregnancy goes on. Talk to your provider about your activities and exercise routines. Relieving pain and discomfort Wear a good, supportive bra if your breasts  hurt. Rest with your legs raised if you have leg cramps or low back pain. Safety Wear your seatbelt at all times when you're in a car. Talk to your provider if someone hits you, hurts you, or yells at you. Talk with your provider if you're feeling sad or have thoughts of hurting yourself. Lifestyle Certain things can be harmful while you're pregnant. Follow these rules: Do not use hot tubs, steam rooms, or saunas. Do not douche. Do not use tampons or scented pads. Do not drink alcohol,smoke, vape, or use products with nicotine or tobacco in them. If you need help quitting, talk with your provider. Avoid cat litter boxes and soil used by cats. These things carry germs that can cause harm to your pregnancy and your baby. General instructions Keep all follow-up visits. It helps you and your unborn baby stay as healthy as possible. Write down your questions. Take them to your visits. Your provider will: Talk with you about your overall health. Give you advice or refer you to specialists who can help with different needs, including: Prenatal education classes. Mental health and counseling. Foods and healthy eating. Ask for help if you need help with food. Call your dentist and ask to be seen. Brush your teeth with a soft toothbrush. Floss gently. Where to find more information American Pregnancy Association: americanpregnancy.org [employer]: acog.org Office on [employer] Health: [employer] Contact a health care provider if: You feel dizzy, faint, or have a fever. You vomit or have watery poop (diarrhea) for 2  days or more. You have abnormal discharge or bleeding from your vagina. You have pain when you pee or your pee smells bad. You have cramps, pain, or pressure in your belly area. Get help right away if: You have trouble breathing or chest pain. You have any kind of injury, such as from a fall or a car crash. These symptoms may be an  emergency. Get help right away. Call 911. Do not wait to see if the symptoms will go away. Do not drive yourself to the hospital. This information is not intended to replace advice given to you by your health care provider. Make sure you discuss any questions you have with your health care provider. Document Revised: 11/12/2022 Document Reviewed: 06/12/2022 Elsevier Patient Education  2024 Elsevier Inc.   Common Medications Safe in Pregnancy  Acne:      Constipation:  Benzoyl Peroxide     Colace  Clindamycin      Dulcolax Suppository  Topica Erythromycin     Fibercon  Salicylic Acid      Metamucil         Miralax AVOID:        Senakot   Accutane    Cough:  Retin-A       Cough Drops  Tetracycline      Phenergan w/ Codeine if Rx  Minocycline      Robitussin (Plain & DM)  Antibiotics:     Crabs/Lice:  Ceclor       RID  Cephalosporins    AVOID:  E-Mycins      Kwell  Keflex  Macrobid/Macrodantin   Diarrhea:  Penicillin      Kao-Pectate  Zithromax      Imodium AD         PUSH FLUIDS AVOID:       Cipro     Fever:  Tetracycline      Tylenol (Regular or Extra  Minocycline       Strength)  Levaquin      Extra Strength-Do not          Exceed 8 tabs/24 hrs Caffeine:        200mg /day (equiv. To 1 cup of coffee or  approx. 3 12 oz sodas)         Gas: Cold/Hayfever:       Gas-X  Benadryl      Mylicon  Claritin       Phazyme  **Claritin-D        Chlor-Trimeton    Headaches:  Dimetapp      ASA-Free Excedrin  Drixoral-Non-Drowsy     Cold Compress  Mucinex (Guaifenasin)     Tylenol (Regular or Extra  Sudafed/Sudafed-12 Hour     Strength)  **Sudafed PE Pseudoephedrine   Tylenol Cold & Sinus     Vicks Vapor Rub  Zyrtec  **AVOID if Problems With Blood Pressure         Heartburn: Avoid lying down for at least 1 hour after meals  Aciphex      Maalox     Rash:  Milk of Magnesia     Benadryl    Mylanta       1% Hydrocortisone Cream  Pepcid  Pepcid Complete   Sleep  Aids:  Prevacid      Ambien   Prilosec       Benadryl  Rolaids       Chamomile Tea  Tums (Limit 4/day)     Unisom  Tylenol PM         Warm milk-add vanilla or  Hemorrhoids:       Sugar for taste  Anusol/Anusol H.C.  (RX: Analapram 2.5%)  Sugar Substitutes:  Hydrocortisone OTC     Ok in moderation  Preparation H      Tucks        Vaseline lotion applied to tissue with wiping    Herpes:     Throat:  Acyclovir      Oragel  Famvir  Valtrex     Vaccines:         Flu Shot Leg Cramps:       *Gardasil  Benadryl      Hepatitis A         Hepatitis B Nasal Spray:       Pneumovax  Saline Nasal Spray     Polio Booster         Tetanus Nausea:       Tuberculosis test or PPD  Vitamin B6 25 mg TID   AVOID:    Dramamine      *Gardasil  Emetrol       Live Poliovirus  Ginger Root 250 mg QID    MMR (measles, mumps &  High Complex Carbs @ Bedtime    rebella)  Sea Bands-Accupressure    Varicella (Chickenpox)  Unisom 1/2 tab TID     *No known complications           If received before Pain:         Known pregnancy;   Darvocet       Resume series after  Lortab        Delivery  Percocet    Yeast:   Tramadol      Femstat  Tylenol 3      Gyne-lotrimin  Ultram       Monistat  Vicodin           MISC:         All Sunscreens           Hair Coloring/highlights          Insect Repellant's          (Including DEET)         Mystic Tans   Commonly Asked Questions During Pregnancy   Cats: A parasite can be excreted in cat feces.  To avoid exposure you need to have another person empty the little box.  If you must empty the litter box you will need to wear gloves.  Wash your hands after handling your cat.  This parasite can also be found in raw or undercooked meat so this should also be avoided.  Colds, Sore Throats, Flu: Please check your medication sheet to see what you can take for symptoms.  If your symptoms are unrelieved by these medications please call the office.  Dental Work: Most  any dental work Agricultural consultant recommends is permitted.  X-rays should only be taken during the first trimester if absolutely necessary.  Your abdomen should be shielded with a lead apron during all x-rays.  Please notify your provider prior to receiving any x-rays.  Novocaine is fine; gas is not recommended.  If your dentist requires a note from [place] prior to dental work please call the office and we will provide one for you.  Exercise: Exercise is an important part of staying healthy during your pregnancy.  You may continue most exercises you were accustomed to prior to pregnancy.  Later in your pregnancy you will most likely notice you have difficulty with activities requiring balance like riding a bicycle.  It is important that you listen to your body and avoid activities that put you at a higher risk of falling.  Adequate rest and staying well hydrated are a must!  If you have questions about the safety of specific activities ask your provider.    Exposure to Children with illness: Try to avoid obvious exposure; report any symptoms to [place] when noted,  If you have chicken pos, red measles or mumps, you should be immune to these diseases.   Please do not take any vaccines while pregnant unless you have checked with your OB provider.  Fetal Movement: After 28 weeks we recommend you do "kick counts" twice daily.  Lie or sit down in a calm quiet environment and count your baby movements "kicks".  You should feel your baby at least 10 times per hour.  If you have not felt 10 kicks within the first hour get up, walk around and have something sweet to eat or drink then repeat for an additional hour.  If count remains less than 10 per hour notify your provider.  Fumigating: Follow your pest control agent's advice as to how long to stay out of your home.  Ventilate the area well before re-entering.  Hemorrhoids:   Most over-the-counter preparations can be used during pregnancy.  Check your medication to see what is  safe to use.  It is important to use a stool softener or fiber in your diet and to drink lots of liquids.  If hemorrhoids seem to be getting worse please call the office.   Hot Tubs:  Hot tubs Jacuzzis and saunas are not recommended while pregnant.  These increase your internal body temperature and should be avoided.  Intercourse:  Sexual intercourse is safe during pregnancy as long as you are comfortable, unless otherwise advised by your provider.  Spotting may occur after intercourse; report any bright red bleeding that is heavier than spotting.  Labor:  If you know that you are in labor, please go to the hospital.  If you are unsure, please call the office and let us help you decide what to do.  Lifting, straining, etc:  If your job requires heavy lifting or straining please check with your provider for any limitations.  Generally, you should not lift items heavier than that you can lift simply with your hands and arms (no back muscles)  Painting:  Paint fumes do not harm your pregnancy, but may make you ill and should be avoided if possible.  Latex or water based paints have less odor than oils.  Use adequate ventilation while painting.  Permanents & Hair Color:  Chemicals in hair dyes are not recommended as they cause increase hair dryness which can increase hair loss during pregnancy.  " Highlighting" and permanents are allowed.  Dye may be absorbed differently and permanents may not hold as well during pregnancy.  Sunbathing:  Use a sunscreen, as skin burns easily during pregnancy.  Drink plenty of fluids; avoid over heating.  Tanning Beds:  Because their possible side effects are still unknown, tanning beds are not recommended.  Ultrasound Scans:  Routine ultrasounds are performed at approximately 20 weeks.  You will be able to see your baby's general anatomy an if you would like to know the gender this can usually be determined as well.  If it is questionable when you conceived you may  also  receive an ultrasound early in your pregnancy for dating purposes.  Otherwise ultrasound exams are not routinely performed unless there is a medical necessity.  Although you can request a scan we ask that you pay for it when conducted because insurance does not cover " patient request" scans.  Work: If your pregnancy proceeds without complications you may work until your due date, unless your physician or employer advises otherwise.  Round Ligament Pain/Pelvic Discomfort:  Sharp, shooting pains not associated with bleeding are fairly common, usually occurring in the second trimester of pregnancy.  They tend to be worse when standing up or when you remain standing for long periods of time.  These are the result of pressure of certain pelvic ligaments called "round ligaments".  Rest, Tylenol and heat seem to be the most effective relief.  As the womb and fetus grow, they rise out of the pelvis and the discomfort improves.  Please notify the office if your pain seems different than that described.  It may represent a more serious condition.

## 2023-11-26 ENCOUNTER — Ambulatory Visit (INDEPENDENT_AMBULATORY_CARE_PROVIDER_SITE_OTHER)

## 2023-11-26 DIAGNOSIS — N96 Recurrent pregnancy loss: Secondary | ICD-10-CM

## 2023-11-26 DIAGNOSIS — Z3A11 11 weeks gestation of pregnancy: Secondary | ICD-10-CM | POA: Diagnosis not present

## 2023-11-26 DIAGNOSIS — O3680X0 Pregnancy with inconclusive fetal viability, not applicable or unspecified: Secondary | ICD-10-CM

## 2023-11-26 DIAGNOSIS — Z3687 Encounter for antenatal screening for uncertain dates: Secondary | ICD-10-CM

## 2023-12-01 NOTE — Patient Instructions (Incomplete)
 First Trimester of Pregnancy  The first trimester of pregnancy starts on the first day of your last monthly period until the end of week 13. This is months 1 through 3 of pregnancy. A week after a sperm fertilizes an egg, the egg will implant into the wall of the uterus and begin to develop into a baby. Body changes during your first trimester Your body goes through many changes during pregnancy. The changes usually return to normal after your baby is born. Physical changes Your breasts may grow larger and may hurt. The area around your nipples may get darker. Your periods will stop. Your hair and nails may grow faster. You may pee more often. Health changes You may tire easily. Your gums may bleed and may be sensitive when you brush and floss. You may not feel hungry. You may have heartburn. You may throw up or feel like you may throw up. You may want to eat some foods, but not others. You may have headaches. You may have trouble pooping (constipation). Other changes Your emotions may change from day to day. You may have more dreams. Follow these instructions at home: Medicines Talk to your health care provider if you're taking medicines. Ask if the medicines are safe to take during pregnancy. Your provider may change the medicines that you take. Do not take any medicines unless told to by your provider. Take a prenatal vitamin that has at least 600 micrograms (mcg) of folic acid. Do not use herbal medicines, illegal substances, or medicines that are not approved by your provider. Eating and drinking While you're pregnant your body needs extra food for your growing baby. Talk with your provider about what to eat while pregnant. Activity Most women are able to exercise during pregnancy. Exercises may need to change as your pregnancy goes on. Talk to your provider about your activities and exercise routines. Relieving pain and discomfort Wear a good, supportive bra if your breasts  hurt. Rest with your legs raised if you have leg cramps or low back pain. Safety Wear your seatbelt at all times when you're in a car. Talk to your provider if someone hits you, hurts you, or yells at you. Talk with your provider if you're feeling sad or have thoughts of hurting yourself. Lifestyle Certain things can be harmful while you're pregnant. Follow these rules: Do not use hot tubs, steam rooms, or saunas. Do not douche. Do not use tampons or scented pads. Do not drink alcohol,smoke, vape, or use products with nicotine or tobacco in them. If you need help quitting, talk with your provider. Avoid cat litter boxes and soil used by cats. These things carry germs that can cause harm to your pregnancy and your baby. General instructions Keep all follow-up visits. It helps you and your unborn baby stay as healthy as possible. Write down your questions. Take them to your visits. Your provider will: Talk with you about your overall health. Give you advice or refer you to specialists who can help with different needs, including: Prenatal education classes. Mental health and counseling. Foods and healthy eating. Ask for help if you need help with food. Call your dentist and ask to be seen. Brush your teeth with a soft toothbrush. Floss gently. Where to find more information American Pregnancy Association: americanpregnancy.org [employer]: acog.org Office on [employer] Health: TravelLesson.ca Contact a health care provider if: You feel dizzy, faint, or have a fever. You vomit or have watery poop (diarrhea) for 2  days or more. You have abnormal discharge or bleeding from your vagina. You have pain when you pee or your pee smells bad. You have cramps, pain, or pressure in your belly area. Get help right away if: You have trouble breathing or chest pain. You have any kind of injury, such as from a fall or a car crash. These symptoms may be an  emergency. Get help right away. Call 911. Do not wait to see if the symptoms will go away. Do not drive yourself to the hospital. This information is not intended to replace advice given to you by your health care provider. Make sure you discuss any questions you have with your health care provider. Document Revised: 11/12/2022 Document Reviewed: 06/12/2022 Elsevier Patient Education  2024 [employer] Genetic Testing During Pregnancy: What to Know Genetic testing is done when you're pregnant to check if your baby might have a congenital condition. A congenital condition is something a baby is born with, also called a birth condition. These conditions can happen when genes or chromosomes are not normal. Genes are tiny parts in your body that make up chromosomes. Chromosomes are groups of many genes. Together, they tell your body how to look and work. Genes are passed down from parents to their baby. Why is genetic testing done during pregnancy? Genetic testing allows you to: Talk about your test results and future plans with your health care team. Plan for a baby that may be born with a congenital condition. Make plans with your health care team in case your baby needs special care before or after birth. Think about your options regarding whether you want to continue with the pregnancy. Types of genetic tests A genetic test can be a screening test or a diagnostic test. Screening tests     Screening tests are used to check the risk of your baby having a congenital condition. They don't show if your baby actually has the condition. More testing will be needed to know for sure. Screening tests are recommended for all pregnant people. Screening tests will not hurt your baby. Types of screening tests include: Carrier screening. The parents' blood or saliva is tested to check for genes that aren't normal. These genes can be passed to the baby. If both parents have the gene, the baby is at  risk. First-trimester screening. This includes a maternal blood test and an ultrasound of your baby. This test checks for a risk of conditions related to chromosomes. It also looks for problems with your baby's heart, belly, or bones. Second-trimester screening. This may include a maternal blood test and an ultrasound of your baby. This test checks for the risk of conditions related to chromosomes. It also looks for problems with many parts of your baby's body. These include the brain, nose, mouth, spine, heart, and arms or legs. Some people may only have an ultrasound and not have a blood test. Combined or sequential screening. This looks at the results from the blood tests in the first and second trimesters, along with the findings of the first-trimester ultrasound. It helps to tell you more about your baby. This type of testing may be more accurate than just doing screening in the first or second trimester by itself. Cell-free DNA testing. During pregnancy, cells from your placenta get into your blood, which is normal. This test is a blood test that looks at those cells. It's done after 10 weeks of pregnancy. It can be used to check for the risk of conditions  caused by having too many chromosomes or an abnormal number of sex chromosomes.  Diagnostic tests Diagnostic tests are done only if your baby is known to be at risk of having a congenital condition. These tests check the cells from your baby to diagnose a condition. Examples of these tests include: Chorionic villus sampling (CVS). This is a procedure where cells are taken from the placenta for testing. To do this, a needle is put into your belly using guided ultrasound. Amniocentesis. This is a procedure where amniotic fluid is removed from the sac around your baby. The cells from the placenta or amniotic fluid are tested for chromosomes that are not normal. What do the results mean? For a screening test: If your results are negative, it means  that your baby is most likely not at a higher risk for a condition. There's still a small chance your baby could have a condition. If your results are positive, it means that your baby's risk for a condition is higher than normal. Your health care provider may want you to have a diagnostic test. For a diagnostic test: If the result is negative, it's not likely that your baby will have a condition. If the test is positive, your baby most likely has a condition. Talk with your provider about what your results mean and what your options are. Questions to ask your health care provider Talk with your provider about the conditions that run in your family. Ask these questions: Is my baby at risk for a congenital condition? What are the benefits of having genetic screening? Should I meet with a genetic counselor? Should my partner or other members of my family be tested? What tests are best for me and my baby? How much do the tests cost? Will my insurance cover the testing? What are the risks of each test? This information is not intended to replace advice given to you by your health care provider. Make sure you discuss any questions you have with your health care provider. Document Revised: 12/31/2022 Document Reviewed: 12/31/2022 Elsevier Patient Education  2025 [employer]. Pregnancy: Healthy Eating While you're pregnant, your body needs extra nutrition for your growing baby. You also need more vitamins and minerals, such as folic acid, calcium , iron , and vitamin D. Eating a balanced diet is important for both you and your baby. Your need for extra calories will change during pregnancy. During the first 3 months of pregnancy, called the first trimester, you don't need more calories. During the second trimester, you'll need about 340 extra calories a day. During the third trimester, you'll need about 450 extra calories a day. If you're carrying more than one baby, talk with your health care  provider or a dietitian to learn more about your specific eating needs. What are tips for eating healthy during pregnancy? Meal planning  Eating smaller meals throughout the day may help manage some side effects common in pregnancy, like heartburn and reflux. Eat a variety of foods. Be sure to include many types of fruits and vegetables. Two or more servings of fish are recommended each week. Choose fish that are lower in mercury, such as salmon and pollock. Limit foods that have empty calories. These are foods that have little nutritional value, such as sweets, desserts, candies, and drinks with sugar in them. Drinks that have caffeine are OK to drink, but it's better to avoid caffeine. Limit your total caffeine intake to less than 200 mg each day, or the limit you're told by your  provider. Be aware that 200 mg of caffeine is 12 oz or 355 mL of coffee, tea, or soda. General information Take a prenatal vitamin to help meet your vitamin and mineral needs during pregnancy. This includes your need for folic acid, iron , calcium , and vitamin D. Do not try to lose weight or go on a diet during pregnancy. Food safety  Wash your hands before you eat and after you prepare raw meat. Wash all fruits and vegetables well before peeling or eating. Make sure that all meats, poultry, and eggs are cooked to food-safe temperatures or well-done. Taking these actions can help keep your food safe and protect you and your baby from dangerous food illnesses. Ask your provider for more information. What foods should I eat? Fruits All fruits. Eat a variety of colors and types of fruit. Remember to wash your fruits well before peeling or eating. Vegetables All vegetables. Eat a variety of colors and types of vegetables. Remember to wash your vegetables well before peeling or eating. Grains All grains. Choose whole grains, such as whole-wheat bread, oatmeal, or brown rice. Meats and other protein foods Lean  meats, including chicken, [place], and lean cuts of beef, veal, or pork. Fish that is higher in omega-3 fatty acids and lower in mercury, such as salmon, herring, mussels, trout, sardines, pollock, shrimp, crab, and lobster. Tofu. Tempeh. Beans. Eggs. Peanut butter and other nut butters. Dairy Pasteurized milk and milk alternatives, such as soy milk. Pasteurized yogurt and pasteurized cheese. Cottage cheese. Sour cream. Beverages Water. Juices that contain 100% fruit juice or vegetable juice. Caffeine-free teas and decaffeinated coffee. Fats and oils Fats and oils are OK to include in moderation. Sweets and desserts Sweets and desserts are OK to include in moderation. Seasoning and other foods All pasteurized condiments. The items listed above may not be all the foods and drinks you can have. Talk with a dietitian to learn more. What does 340 extra calories look like? Healthy snacks that give you 340 more calories a day could be: Peanut butter and jelly with milk: 8 oz (237 mL) of low-fat milk. Peanut butter and jelly sandwich made with: 1 slice of whole-wheat bread. 2 teaspoons (10 g) of peanut butter. Yogurt and berries: 1 cup (245 g) of [place] yogurt. 1 cup (150 g) of berries. 2 tablespoons (30 g) of chopped nuts, such as almonds or walnuts. Avocado toast: 1 slice of whole-wheat bread. 1/2 medium avocado (70 g). 1 large egg (50 g). What foods should I avoid? Fruits Raw (unpasteurized) fruit juices. Vegetables Unpasteurized vegetable juices. Meats and other protein foods Precooked or cured meat, such as bologna, hot dogs, sausages, or meat loaves. (If you must eat those meats, reheat them until they are steaming hot.) Refrigerated pate, meat spreads from a meat counter, or smoked seafood that's found in the refrigerated section of a store. Raw or undercooked meats, poultry, and eggs. Raw fish, such as sushi or sashimi. Fish that have high mercury content, such as tilefish, shark,  swordfish, and king mackerel. Dairy Unpasteurized or raw milk and any foods that are made from them. Some of these may be: Homemade yogurts or puddings. Soft cheeses such as: Feta. Queso blanco or fresco. Pharmacist, hospital or [place]. Blue-veined cheeses. Some of these types of cheeses may be made with pasteurized milk. Check the label. If pasteurized milk is used, they are OK to eat during pregnancy. Deli foods Premade foods from a store or deli, like chicken salad, coleslaw, or egg salad. These are  riskier for food illness than fresh or homemade salads. Beverages Alcohol. Sugar-sweetened drinks, such as sodas or teas. Energy drinks. Seasoning and other foods Homemade fermented foods and drinks, such as: Pickles. Sauerkraut. Kombucha. Store-bought pasteurized versions of these are OK. The items listed above may not be all the foods and drinks you should avoid. Talk with a dietitian to learn more. Where to find more information To learn more, go to: Centers for Disease Control and Prevention at TonerPromos.no. Click Search and type food choices for pregnancy. Find the link you need. MyPlate at http://pittman-dennis.biz/. This information is not intended to replace advice given to you by your health care provider. Make sure you discuss any questions you have with your health care provider. Document Revised: 01/27/2023 Document Reviewed: 01/27/2023 Elsevier Patient Education  2025 [employer]. Exercise During Pregnancy Exercise is an important part of being healthy for people of all ages. Exercise helps your heart and lungs work well. Exercise also: Helps you stay strong and flexible. Helps you keep a healthy body weight. Boosts your energy levels and improves your mood. You should try to exercise regularly during pregnancy. Exercise routines may need to change later in your pregnancy. In rare cases, certain medical problems in your pregnancy may limit the exercise you can do during  pregnancy. Your health care provider will give you information on what exercises will work for you. How does exercise help during pregnancy? Along with staying strong and flexible, exercising during pregnancy can help: Keep strength in muscles that are used during labor and birth. Control weight gain. Speed up your recovery after giving birth. Reduce the need for insulin if you get diabetes during pregnancy. Decrease low back pain. Lower the risk for depression. Lower the risk of cesarean delivery. Treat trouble pooping (constipation). How does exercise affect my baby? Exercise can help you have a healthy pregnancy. Exercise does not cause your baby to be born early. It will not cause your baby to weigh less at birth. What exercises can I do? Many exercises are safe for you to do during pregnancy. Do a variety of exercises that safely increase your heart and breathing rates and help you build and maintain muscle strength. Do exercises as told by your provider. Your provider may recommend: Walking. Swimming. Water aerobics. Riding a stationary bike. Modified yoga or Pilates. Tell your instructor that you're pregnant. Avoid overstretching. Avoid lying on your back for long periods of time. Resistance exercises with weights or elastic bands. Running or jogging. Choose this type of exercise only if: You ran or jogged regularly before your pregnancy. You can run or jog and still talk in full sentences. What exercises should I avoid? You may be told to limit high-intensity exercise depending on your level of fitness and if you exercised regularly before you became pregnant. You can tell that you're exercising at a high intensity if you're breathing much harder and faster and can't hold a conversation while exercising. You may be told to: Avoid jogging or running, unless you jogged or ran regularly before you became pregnant. Do not run or jog so fast that you're unable to have a  conversation. Avoid activities that put you at risk for falling on your belly or getting hit in the belly. Some of these are: Downhill skiing. Rock climbing. Cycling and gymnastics. Horseback riding. Surfing and waterskiing. Contact sports. Avoid scuba diving. Avoid skydiving. Avoid activities that take place in a room that's heated to high temperatures, such as hot yoga or hot  Pilates. How do I exercise in a safe way?  Start slowly. Ask your provider to recommend the types of exercise that are safe for you. Avoid overheating. Do not exercise in very high temperatures or hot rooms. Avoid hot yoga or hot Pilates. Avoid standing still or lying flat on your back as much as you can. Avoid losing too much fluid (dehydration). Drink more fluids as told. Drink before, during, and after you exercise. Avoid overstretching. Because of hormone changes during pregnancy, it's easy to overstretch muscles, tendons, and ligaments. Ligaments are the tissues that connect bones to each other. Do not exercise to lose weight. Do not exercise at more than 6,000 feet above sea level (high elevation) if you don't live at that elevation. Tips and recommendations Wear loose-fitting, breathable clothes. Wear a sports bra to support your breasts. Exercise on most days or all days of the week. Try to exercise for 30 minutes a day, 5 days a week. If problems come up during your pregnancy, you provider may tell you to limit some exercises or to exercise less. If you have concerns, ask your provider. If you actively exercised before your pregnancy, your provider may tell you to continue to do moderate-intensity to high-intensity exercise. If you're just starting to exercise or didn't exercise much before your pregnancy, your provider may tell you to do low-intensity to moderate-intensity exercise. Questions to ask your health care provider Is exercise safe for me? What are signs that I should stop exercising? Does my  health condition mean that I should not exercise during pregnancy? When should I avoid exercising during pregnancy? Stop exercising and contact a health care provider if: You have any unusual symptoms, such as: Mild contractions or cramps in the belly. Dizziness that does not go away when you rest. Headache. Pain and swelling of your calves. Bleeding or fluid leaking from your vagina. Stop exercising and get help right away if: You have: Chest pain. Shortness of breath. Sudden, severe pain in your low back or your belly. Regular, painful contractions before 37 weeks of pregnancy. These symptoms may be an emergency. Call 911 right away. Do not wait to see if the symptoms will go away. Do not drive yourself to the hospital. This information is not intended to replace advice given to you by your health care provider. Make sure you discuss any questions you have with your health care provider. Document Revised: 10/05/2022 Document Reviewed: 10/05/2022 Elsevier Patient Education  2024 [employer].

## 2023-12-02 ENCOUNTER — Encounter: Admitting: Advanced Practice Midwife

## 2023-12-02 DIAGNOSIS — Z3A11 11 weeks gestation of pregnancy: Secondary | ICD-10-CM

## 2023-12-03 ENCOUNTER — Encounter: Admitting: Certified Nurse Midwife

## 2023-12-10 ENCOUNTER — Encounter: Admitting: Obstetrics

## 2023-12-24 NOTE — Progress Notes (Signed)
 NEW OB HISTORY AND PHYSICAL  SUBJECTIVE:       Anita Robinson is a 28 y.o. [phone] female, Patient's last menstrual period was 09/13/2023 (exact date)., Estimated Date of Delivery: 06/19/24, [redacted]w[redacted]d, presents today for establishment of Prenatal Care. This was a planned pregnancy  Her previous pregnancies were managed by practice din [place] or [place], she recently [place] to [place] so will begin her care at [facility].  She reports tender breast, back pain, nausea-has tried B6, cracker and sour candies with not much relief. Unisom  makes her too sleepy.   Anemia in pregnancies needed Iron  transfusion in her last pregnancy   Social history Partner/Relationship:married  Living situation:Husband and children, feels safe  Work:works from home with [employer]  Exercise:nothing  Substance ldz:izwpzd all   Hx of PTD at 28wks PPROM and abruption needed C-csection, desires VBAC, she was closely monitored I her last pregnancy for FGR Gained 20lbs, breastfed 4-5 months-had low supply and pain, denies hx PPD   Indications for ASA therapy (per uptodate) One of the following: Previous pregnancy with preeclampsia, especially early onset and with an adverse outcome No Multifetal gestation No Chronic hypertension No Type 1 or 2 diabetes mellitus No Chronic kidney disease No Autoimmune disease (antiphospholipid syndrome, systemic lupus erythematosus) No  Two or more of the following: Nulliparity No Obesity (body mass index >30 kg/m2) No Family history of preeclampsia in mother or sister No Age >=35 years No Sociodemographic characteristics (African American race, low socioeconomic level) Yes Personal risk factors (eg, previous pregnancy with low birth weight or small for gestational age infant, previous adverse pregnancy outcome [eg, stillbirth], interval >10 years between pregnancies) YES  Indications for early GDM screening  First-degree relative with diabetes No BMI >30kg/m2 No Age  > 35 No Previous birth of an infant weighing >=4000 g No Gestational diabetes mellitus in a previous pregnancy No Glycated hemoglobin >=5.7 percent (39 mmol/mol), impaired glucose tolerance, or impaired fasting glucose on previous testing No High-risk race/ethnicity (eg, African American, Latino, Native American, Asian American, Pacific Islander) Yes Previous stillbirth of unknown cause No Maternal birthweight > 9 lbs No History of cardiovascular disease No Hypertension or on therapy for hypertension No High-density lipoprotein cholesterol level <35 mg/dL (9.09 mmol/L) and/or a triglyceride level >250 mg/dL (7.17 mmol/L) No Polycystic ovary syndrome No Physical inactivity No Other clinical condition associated with insulin resistance (eg, severe obesity, acanthosis nigricans) No Current use of glucocorticoids No   Early screening tests: FBS, A1C, Random CBG, glucose challenge  Gynecologic History Patient's last menstrual period was 09/13/2023 (exact date). Normal Contraception: none Last Pap: 4 years ago, denies abnormal  Obstetric History OB History  Gravida Para Term Preterm AB Living  9 4 2 2 4 4   SAB IAB Ectopic Multiple Live Births  4 0 0  4    # Outcome Date GA Lbr Len/2nd Weight Sex Type Anes PTL Lv  9 Current           8 SAB 05/02/23          7 SAB 02/2023          6 SAB 10/2022 [redacted]w[redacted]d         5 Term 08/21/21 [redacted]w[redacted]d / 00:02 6 lb (2.722 kg) F Vag-Spont None N LIV  4 Preterm 10/07/20 [redacted]w[redacted]d 07:09 / 00:10 6 lb 14.6 oz (3.135 kg) M VBAC EPI  LIV  3 Preterm 12/03/19 [redacted]w[redacted]d  2 lb 4 oz (1.02 kg) F CS-LTranv Gen  LIV  2 SAB 2020  [redacted]w[redacted]d         1 Term 12/23/13 [redacted]w[redacted]d -16:59 / 01:15 7 lb 0.5 oz (3.189 kg) M Vag-Spont EPI  LIV    Past Medical History:  Diagnosis Date   Anemia    on Iron     Past Surgical History:  Procedure Laterality Date   CESAREAN SECTION N/A 12/03/2019   Procedure: CESAREAN SECTION;  Surgeon: Storm Setter, DO;  Location: [facility] LD ORS;  Service: Obstetrics;   Laterality: N/A;   CLOSED REDUCTION MANDIBLE Left 01/28/2022   Procedure: CLOSED REDUCTION OF LEFT CONDYLOID FRACTURE WITH FIXATION;  Surgeon: Joanette Soulier, DMD;  Location: [facility] OR;  Service: Oral Surgery;  Laterality: Left;    Current Outpatient Medications on File Prior to Visit  Medication Sig Dispense Refill   Prenatal Vit-Fe Fumarate-FA (PRENATAL MULTIVITAMIN) TABS tablet Take 1 tablet by mouth daily at 12 noon.     acetaminophen  (TYLENOL ) 500 MG tablet Take 1,000 mg by mouth every 6 (six) hours as needed (pain.).     progesterone  (PROMETRIUM ) 200 MG capsule Place 2 capsules (400 mg total) vaginally 2 (two) times daily. Continue until 16w of pregnancy 360 capsule 2   No current facility-administered medications on file prior to visit.    No Known Allergies  Social History   Socioeconomic History   Marital status: Married    Spouse name: Tim   Number of children: 4   Years of education: Not on file   Highest education level: Some college, no degree  Occupational History   Occupation: Buyer, Retail: [employer]  Tobacco Use   Smoking status: Never    Passive exposure: Never   Smokeless tobacco: Never  Vaping Use   Vaping status: Never Used  Substance and Sexual Activity   Alcohol use: Not Currently    Comment: social drinker   Drug use: No   Sexual activity: Yes    Partners: Male    Birth control/protection: None  Other Topics Concern   Not on file  Social History Narrative   Not on file   Social Drivers of Health   Financial Resource Strain: Low Risk  (11/05/2023)   Overall Financial Resource Strain (CARDIA)    Difficulty of Paying Living Expenses: Not hard at all  Food Insecurity: No Food Insecurity (11/05/2023)   Hunger Vital Sign    Worried About Running Out of Food in the Last Year: Never true    Ran Out of Food in the Last Year: Never true  Transportation Needs: No Transportation Needs (11/05/2023)   PRAPARE - Scientist, Research (physical Sciences) (Medical): No    Lack of Transportation (Non-Medical): No  Physical Activity: Insufficiently Active (11/05/2023)   Exercise Vital Sign    Days of Exercise per Week: 1 day    Minutes of Exercise per Session: 10 min  Stress: No Stress Concern Present (11/05/2023)   [employer] of Occupational Health - Occupational Stress Questionnaire    Feeling of Stress: Not at all  Social Connections: Moderately Isolated (11/05/2023)   Social Connection and Isolation Panel    Frequency of Communication with Friends and Family: More than three times a week    Frequency of Social Gatherings with Friends and Family: More than three times a week    Attends Religious Services: Patient declined    Database Administrator or Organizations: No    Attends Engineer, Structural: Not on file    Marital Status: Married  Catering Manager  Violence: Not At Risk (11/05/2023)   Humiliation, Afraid, Rape, and Kick questionnaire    Fear of Current or Ex-Partner: No    Emotionally Abused: No    Physically Abused: No    Sexually Abused: No    Family History  Problem Relation Age of Onset   Asthma Mother    Asthma Sister    Asthma Brother    Alcohol abuse Neg Hx    Arthritis Neg Hx    Birth defects Neg Hx    Cancer Neg Hx    COPD Neg Hx    Depression Neg Hx    Diabetes Neg Hx    Drug abuse Neg Hx    Early death Neg Hx    Hearing loss Neg Hx    Heart disease Neg Hx    Hyperlipidemia Neg Hx    Hypertension Neg Hx    Kidney disease Neg Hx    Learning disabilities Neg Hx    Mental illness Neg Hx    Mental retardation Neg Hx    Miscarriages / Stillbirths Neg Hx    Stroke Neg Hx    Vision loss Neg Hx    Varicose Veins Neg Hx     The following portions of the patient's history were reviewed and updated as appropriate: allergies, current medications, past OB history, past medical history, past surgical history, past family history, past social history, and problem  list.  Constitutional: Denied constitutional symptoms, night sweats, recent illness, fatigue, fever, insomnia and weight loss.  Eyes: Denied eye symptoms, eye pain, photophobia, vision change and visual disturbance.  Ears/Nose/Throat/Neck: Denied ear, nose, throat or neck symptoms, hearing loss, nasal discharge, sinus congestion and sore throat.  Cardiovascular: Denied cardiovascular symptoms, arrhythmia, chest pain/pressure, edema, exercise intolerance, orthopnea and palpitations.  Respiratory: Denied pulmonary symptoms, asthma, pleuritic pain, productive sputum, cough, dyspnea and wheezing.  Gastrointestinal: Denied gastro-esophageal reflux, melena, and vomiting. Yes nausea   Genitourinary: Denied genitourinary symptoms including symptomatic vaginal discharge, pelvic relaxation issues, and urinary complaints.  Musculoskeletal: Denied musculoskeletal symptoms, stiffness, swelling, muscle weakness and myalgia.  Dermatologic: Denied dermatology symptoms, rash and scar.  Neurologic: Denied neurology symptoms, dizziness, headache, neck pain and syncope.  Psychiatric: Denied psychiatric symptoms, anxiety and depression.  Endocrine: Denied endocrine symptoms including hot flashes and night sweats.     OBJECTIVE: Initial Physical Exam (New OB)  Physical Exam Constitutional:      Appearance: Normal appearance.  Cardiovascular:     Rate and Rhythm: Normal rate and regular rhythm.     Pulses: Normal pulses.     Heart sounds: Normal heart sounds.  Pulmonary:     Effort: Pulmonary effort is normal.     Breath sounds: Normal breath sounds.  Chest:     Comments: Breasts: no redness/discoloration or masses. Nipples intact bilaterally.  Abdominal:     Tenderness: There is no abdominal tenderness.     Comments: Fetal heart tones present   Genitourinary:    Comments: Declined exam Musculoskeletal:        General: Normal range of motion.     Cervical back: Normal range of motion and neck supple.   Skin:    General: Skin is warm.  Neurological:     General: No focal deficit present.     Mental Status: She is alert.  Psychiatric:        Mood and Affect: Mood normal.        Thought Content: Thought content normal.     Fetal Heart Rate (bpm):  150  ASSESSMENT: Normal pregnancy   PLAN: Routine prenatal care. We discussed an overview of prenatal care and when to call. Reviewed diet, exercise, and weight gain recommendations in pregnancy. Discussed benefits of breastfeeding and lactation resources at [facility]. I reviewed labs and answered all questions.  1. Encounter for supervision of other normal pregnancy in second trimester (Primary) - ondansetron  (ZOFRAN -ODT) 4 MG disintegrating tablet; Take 1 tablet (4 mg total) by mouth every 6 (six) hours as needed for nausea.  Dispense: 120 tablet; Refill: 3 - NOB Panel - Culture, OB Urine - Monitor Drug Profile 14(MW) - Nicotine screen, urine - Urinalysis, Routine w reflex microscopic - Hgb Fractionation Cascade - Hemoglobin A1c - Cervicovaginal ancillary only - MaterniT 21 plus Core, Blood - US  OB Comp + 14 Wk; Future  2. [redacted] weeks gestation of pregnancy - NOB Panel - Culture, OB Urine - Monitor Drug Profile 14(MW) - Nicotine screen, urine - Urinalysis, Routine w reflex microscopic - Hgb Fractionation Cascade - Hemoglobin A1c - Cervicovaginal ancillary only - MaterniT 21 plus Core, Blood  3. Nausea and vomiting in pregnancy - ondansetron  (ZOFRAN -ODT) 4 MG disintegrating tablet; Take 1 tablet (4 mg total) by mouth every 6 (six) hours as needed for nausea.  Dispense: 120 tablet; Refill: 3  4. Genetic screening - MaterniT 21 plus Core, Blood  5. Screening examination for STD (sexually transmitted disease) - Cervicovaginal ancillary only  6. Encounter for drug screening - Monitor Drug Profile 14(MW)  7. Encounter for fetal ultrasound - US  OB Comp + 14 Wk; Future  Discussed 11-20lbs weight gain, Pap at [facility] visit.  Rec  starting low dose baby ASA, pt would prefer to research this first.   HX PTB x2, she has not started vaginal progesterone   Prior c-section followed by VBAC, aware she needs to see MD  Rec flu vaccine, declined   Takeela Peil M Tabrina Esty, CNM

## 2023-12-27 ENCOUNTER — Encounter: Admitting: Certified Nurse Midwife

## 2023-12-27 ENCOUNTER — Other Ambulatory Visit (HOSPITAL_COMMUNITY)
Admission: RE | Admit: 2023-12-27 | Discharge: 2023-12-27 | Disposition: A | Discharge status: Home / Self Care | Source: Ambulatory Visit | Attending: Licensed Practical Nurse | Admitting: Licensed Practical Nurse

## 2023-12-27 ENCOUNTER — Ambulatory Visit: Admitting: Licensed Practical Nurse

## 2023-12-27 ENCOUNTER — Encounter: Payer: Self-pay | Admitting: Licensed Practical Nurse

## 2023-12-27 VITALS — BP 108/71 | HR 80 | Wt 182.2 lb

## 2023-12-27 DIAGNOSIS — Z3A15 15 weeks gestation of pregnancy: Secondary | ICD-10-CM | POA: Diagnosis not present

## 2023-12-27 DIAGNOSIS — Z1379 Encounter for other screening for genetic and chromosomal anomalies: Secondary | ICD-10-CM

## 2023-12-27 DIAGNOSIS — Z113 Encounter for screening for infections with a predominantly sexual mode of transmission: Secondary | ICD-10-CM | POA: Diagnosis present

## 2023-12-27 DIAGNOSIS — Z0283 Encounter for blood-alcohol and blood-drug test: Secondary | ICD-10-CM

## 2023-12-27 DIAGNOSIS — Z369 Encounter for antenatal screening, unspecified: Secondary | ICD-10-CM

## 2023-12-27 DIAGNOSIS — O219 Vomiting of pregnancy, unspecified: Secondary | ICD-10-CM

## 2023-12-27 DIAGNOSIS — Z3482 Encounter for supervision of other normal pregnancy, second trimester: Secondary | ICD-10-CM | POA: Insufficient documentation

## 2023-12-27 MED ORDER — ASPIRIN 81 MG PO CHEW: 81.0000 mg | CHEWABLE_TABLET | Freq: Every day | ORAL | Status: DC

## 2023-12-27 MED ORDER — ONDANSETRON 4 MG PO TBDP: 4.0000 mg | ORAL_TABLET | Freq: Four times a day (QID) | ORAL | 3 refills | Status: DC | PRN

## 2023-12-28 LAB — URINALYSIS, ROUTINE W REFLEX MICROSCOPIC
Bilirubin, UA: NEGATIVE
Glucose, UA: NEGATIVE
Ketones, UA: NEGATIVE
Leukocytes,UA: NEGATIVE
Nitrite, UA: NEGATIVE
RBC, UA: NEGATIVE
Specific Gravity, UA: 1.023 (ref 1.005–1.030)
Urobilinogen, Ur: 0.2 mg/dL (ref 0.2–1.0)
pH, UA: 6.5 (ref 5.0–7.5)

## 2023-12-28 LAB — CERVICOVAGINAL ANCILLARY ONLY
Bacterial Vaginitis (gardnerella): POSITIVE — AB
Candida Glabrata: NEGATIVE
Candida Vaginitis: NEGATIVE
Chlamydia: NEGATIVE
Comment: NEGATIVE
Comment: NEGATIVE
Comment: NEGATIVE
Comment: NEGATIVE
Comment: NEGATIVE
Comment: NORMAL
Neisseria Gonorrhea: NEGATIVE
Trichomonas: NEGATIVE

## 2023-12-29 LAB — MONITOR DRUG PROFILE 14(MW)
Amphetamine Scrn, Ur: NEGATIVE ng/mL
BARBITURATE SCREEN URINE: NEGATIVE ng/mL
BENZODIAZEPINE SCREEN, URINE: NEGATIVE ng/mL
Buprenorphine, Urine: NEGATIVE ng/mL
CANNABINOIDS UR QL SCN: NEGATIVE ng/mL
Cocaine (Metab) Scrn, Ur: NEGATIVE ng/mL
Creatinine(Crt), U: 142.2 mg/dL (ref 20.0–300.0)
Fentanyl, Urine: NEGATIVE pg/mL
Meperidine Screen, Urine: NEGATIVE ng/mL
Methadone Screen, Urine: NEGATIVE ng/mL
OXYCODONE+OXYMORPHONE UR QL SCN: NEGATIVE ng/mL
Opiate Scrn, Ur: NEGATIVE ng/mL
Ph of Urine: 6.1 (ref 4.5–8.9)
Phencyclidine Qn, Ur: NEGATIVE ng/mL
Propoxyphene Scrn, Ur: NEGATIVE ng/mL
SPECIFIC GRAVITY: 1.024
Tramadol Screen, Urine: NEGATIVE ng/mL

## 2023-12-29 LAB — CBC/D/PLT+RPR+RH+ABO+RUBIGG...
Antibody Screen: NEGATIVE
Basophils Absolute: 0 x10E3/uL (ref 0.0–0.2)
Basos: 0 %
EOS (ABSOLUTE): 0.1 x10E3/uL (ref 0.0–0.4)
Eos: 1 %
HCV Ab: NONREACTIVE
HIV Screen 4th Generation wRfx: NONREACTIVE
Hematocrit: 33.3 % — ABNORMAL LOW (ref 34.0–46.6)
Hemoglobin: 10.2 g/dL — ABNORMAL LOW (ref 11.1–15.9)
Hepatitis B Surface Ag: NEGATIVE
Immature Grans (Abs): 0 x10E3/uL (ref 0.0–0.1)
Immature Granulocytes: 0 %
Lymphocytes Absolute: 1.2 x10E3/uL (ref 0.7–3.1)
Lymphs: 18 %
MCH: 23.4 pg — ABNORMAL LOW (ref 26.6–33.0)
MCHC: 30.6 g/dL — ABNORMAL LOW (ref 31.5–35.7)
MCV: 76 fL — ABNORMAL LOW (ref 79–97)
Monocytes Absolute: 0.4 x10E3/uL (ref 0.1–0.9)
Monocytes: 6 %
Neutrophils Absolute: 4.8 x10E3/uL (ref 1.4–7.0)
Neutrophils: 74 %
Platelets: 270 x10E3/uL (ref 150–450)
RBC: 4.36 x10E6/uL (ref 3.77–5.28)
RDW: 13 % (ref 11.7–15.4)
RPR Ser Ql: NONREACTIVE
Rh Factor: POSITIVE
Rubella Antibodies, IGG: 6.94 {index} (ref >0.99)
Varicella zoster IgG: REACTIVE
WBC: 6.5 x10E3/uL (ref 3.4–10.8)

## 2023-12-29 LAB — URINE CULTURE, OB REFLEX

## 2023-12-29 LAB — HEMOGLOBIN A1C
Est. average glucose Bld gHb Est-mCnc: 108 mg/dL
Hgb A1c MFr Bld: 5.4 % (ref 4.8–5.6)

## 2023-12-29 LAB — HGB FRACTIONATION CASCADE
Hgb A2: 2.8 % (ref 1.8–3.2)
Hgb A: 97.2 % (ref 96.4–98.8)
Hgb F: 0 % (ref 0.0–2.0)
Hgb S: 0 %

## 2023-12-29 LAB — NICOTINE SCREEN, URINE: Cotinine Ql Scrn, Ur: NEGATIVE ng/mL

## 2023-12-29 LAB — CULTURE, OB URINE

## 2023-12-29 LAB — HCV INTERPRETATION

## 2024-01-02 ENCOUNTER — Ambulatory Visit: Payer: Self-pay | Admitting: Licensed Practical Nurse

## 2024-01-02 ENCOUNTER — Encounter: Payer: Self-pay | Admitting: Licensed Practical Nurse

## 2024-01-02 ENCOUNTER — Other Ambulatory Visit: Payer: Self-pay | Admitting: Licensed Practical Nurse

## 2024-01-02 DIAGNOSIS — B9689 Other specified bacterial agents as the cause of diseases classified elsewhere: Secondary | ICD-10-CM

## 2024-01-02 LAB — MATERNIT 21 PLUS CORE, BLOOD
Fetal Fraction: 23
Result (T21): NEGATIVE
Trisomy 13 (Patau syndrome): NEGATIVE
Trisomy 18 (Edwards syndrome): NEGATIVE
Trisomy 21 (Down syndrome): NEGATIVE

## 2024-01-02 MED ORDER — METRONIDAZOLE 500 MG PO TABS
500.0000 mg | ORAL_TABLET | Freq: Two times a day (BID) | ORAL | 0 refills | Status: AC
Start: 2024-01-02 — End: ?

## 2024-01-05 DIAGNOSIS — Z419 Encounter for procedure for purposes other than remedying health state, unspecified: Secondary | ICD-10-CM | POA: Diagnosis not present

## 2024-01-24 ENCOUNTER — Ambulatory Visit

## 2024-01-24 ENCOUNTER — Ambulatory Visit: Admitting: Obstetrics

## 2024-01-24 VITALS — BP 93/61 | HR 85 | Wt 186.0 lb

## 2024-01-24 DIAGNOSIS — Z3A19 19 weeks gestation of pregnancy: Secondary | ICD-10-CM | POA: Diagnosis not present

## 2024-01-24 DIAGNOSIS — Z3482 Encounter for supervision of other normal pregnancy, second trimester: Secondary | ICD-10-CM

## 2024-01-24 DIAGNOSIS — D649 Anemia, unspecified: Secondary | ICD-10-CM | POA: Diagnosis not present

## 2024-01-24 DIAGNOSIS — O99012 Anemia complicating pregnancy, second trimester: Secondary | ICD-10-CM | POA: Diagnosis not present

## 2024-01-24 DIAGNOSIS — O219 Vomiting of pregnancy, unspecified: Secondary | ICD-10-CM

## 2024-01-24 DIAGNOSIS — Z3480 Encounter for supervision of other normal pregnancy, unspecified trimester: Secondary | ICD-10-CM

## 2024-01-24 DIAGNOSIS — Z369 Encounter for antenatal screening, unspecified: Secondary | ICD-10-CM

## 2024-01-24 MED ORDER — ONDANSETRON 4 MG PO TBDP: 4.0000 mg | ORAL_TABLET | Freq: Three times a day (TID) | ORAL | 3 refills | Status: AC | PRN

## 2024-01-24 MED ORDER — FERROUS SULFATE 325 (65 FE) MG PO TABS: 325.0000 mg | ORAL_TABLET | ORAL | 3 refills | Status: AC

## 2024-01-24 NOTE — Assessment & Plan Note (Signed)
-  Anatomy US  today. Preliminary report complete, normal anatomy -Anticipatory guidance about 2nd trimester, fetal development -Refill sent for Zofran  -Discussed danger signs and when to go to the hospital

## 2024-01-24 NOTE — Progress Notes (Signed)
    Return Prenatal Note   Assessment/Plan   Plan  28 y.o. [id number] at [redacted]w[redacted]d presents for follow-up OB visit. Reviewed prenatal record including previous visit note.  Anemia in pregnancy -Ferrous sulfate  every other day  Supervision of other normal pregnancy, antepartum -Anatomy US  today. Preliminary report complete, normal anatomy -Anticipatory guidance about 2nd trimester, fetal development -Refill sent for Zofran  -Discussed danger signs and when to go to the hospital    No orders of the defined types were placed in this encounter.  Return in about 4 weeks (around 02/21/2024) for ROB.   Future Appointments  Date Time Provider Department Center  02/21/2024  9:35 AM Justino Eleanor HERO, CNM [facility] None    For next visit:  Routine prenatal care    Subjective  Maleeah is still having frequent nausea. She is able to keep down food. She does not plan to take ASA.   Movement: Present Contractions: Not present  Objective   Flow sheet Vitals: Pulse Rate: 85 BP: 93/61 Fetal Heart Rate (bpm): 152 Total weight gain: 13 lb (5.897 kg)  General Appearance  No acute distress, well appearing, and well nourished Pulmonary   Normal work of breathing Neurologic   Alert and oriented to person, place, and time Psychiatric   Mood and affect within normal limits  Eleanor Justino, CNM 01/24/24 9:51 AM

## 2024-01-24 NOTE — Assessment & Plan Note (Signed)
-  Ferrous sulfate every other day

## 2024-02-21 ENCOUNTER — Encounter: Admitting: Obstetrics

## 2024-02-21 NOTE — Progress Notes (Deleted)
" ° ° °  Return Prenatal Note   Assessment/Plan   Plan  28 y.o. [id number] at [redacted]w[redacted]d presents for follow-up OB visit. Reviewed prenatal record including previous visit note.  No problem-specific Assessment & Plan notes found for this encounter.    No orders of the defined types were placed in this encounter.  No follow-ups on file.   Future Appointments  Date Time Provider Department Center  02/21/2024  9:35 AM Justino Eleanor HERO, CNM [facility] None    For next visit:  {Return Prenatal Care:31737}    Subjective       Objective   Flow sheet Vitals:   Total weight gain: 13 lb (5.897 kg)  General Appearance  No acute distress, well appearing, and well nourished Pulmonary   Normal work of breathing Neurologic   Alert and oriented to person, place, and time Psychiatric   Mood and affect within normal limits  Eleanor Justino, CNM 02/21/2024 8:46 AM  "

## 2024-03-08 NOTE — Patient Instructions (Signed)
 Second Trimester of Pregnancy  The second trimester of pregnancy is from week 14 through week 27. This is months 4 through 6 of pregnancy. During the second trimester: Morning sickness is less or has stopped. You may have more energy. You may feel hungry more often. At this time, your unborn baby is growing very fast. At the end of the sixth month, the unborn baby may be up to 12 inches long and weigh about 1 pounds. You will likely start to feel the baby move between 16 and 20 weeks of pregnancy. Body changes during your second trimester Your body continues to change during this time. The changes usually go away after your baby is born. Physical changes You will gain more weight. Your belly will get bigger. You may begin to get stretch marks on your hips, belly, and breasts. Your breasts will keep growing and may hurt. You may get dark spots or blotches on your face. A dark line from your belly button to the pubic area may appear. This line is called linea nigra. Your hair may grow faster and get thicker. Health changes You may have headaches. You may have heartburn. You may pee more often. You may have swollen, bulging veins (varicose veins). You may have trouble pooping (constipation), or swollen veins in the butt that can itch or get painful (hemorrhoids). You may have back pain. This is caused by: Weight gain. Pregnancy hormones that are relaxing the joints in your pelvis. Follow these instructions at home: Medicines Talk to your health care provider if you're taking medicines. Ask if the medicines are safe to take during pregnancy. Your provider may change the medicines that you take. Do not take any medicines unless told to by your provider. Take a prenatal vitamin that has at least 600 micrograms (mcg) of folic acid. Do not use herbal medicines, illegal drugs, or medicines that are not approved by your provider. Eating and drinking While you're pregnant your body needs  extra food for your growing baby. Talk with your provider about what to eat while pregnant. Activity Most women are able to exercise during pregnancy. Exercises may need to change as your pregnancy goes on. Talk to your provider about your activities and exercise routines. Relieving pain and discomfort Wear a good, supportive bra if your breasts hurt. Rest with your legs raised if you have leg cramps or low back pain. Take warm sitz baths to soothe pain from hemorrhoids. Use hemorrhoid cream if your provider says it's okay. Do not douche. Do not use tampons or scented pads. Do not use hot tubs, steam rooms, or saunas. Safety Wear your seatbelt at all times when you're in a car. Talk to your provider if someone hits you, hurts you, or yells at you. Talk with your provider if you're feeling sad or have thoughts of hurting yourself. Lifestyle Certain things can be harmful while you're pregnant. It's best to avoid the following: Do not drink alcohol,smoke, vape, or use products with nicotine or tobacco in them. If you need help quitting, talk with your provider. Avoid cat litter boxes and soil used by cats. These things carry germs that can cause harm to your pregnancy and your baby. General instructions Keep all follow-up visits. It helps you and your unborn baby stay as healthy as possible. Write down your questions. Take them to your prenatal visits. Your provider will: Talk with you about your overall health. Give you advice or refer you to specialists who can help with different needs,  including: Prenatal education classes. Mental health and counseling. Foods and healthy eating. Ask for help if you need help with food. Where to find more information American Pregnancy Association: americanpregnancy.org [employer] of Obstetricians and Gynecologists: acog.org Office on [employer] Health: [employer] Contact a health care provider if: You have a headache that does not go away  when you take medicine. You have any of these problems: You can't eat or drink. You throw up or feel like you may throw up. You have watery poop (diarrhea) for 2 days or more. You have pain when you pee or your pee smells bad. You have been sick for 2 days or more and are not getting better. Contact your provider right away if: You have any of these coming from your vagina: Abnormal discharge. Bad-smelling fluid. Bleeding. Your baby is moving less than usual. You have contractions, belly cramping, or have pain in your pelvis or lower back. You have symptoms of high blood pressure or preeclampsia. These include: A severe, throbbing headache that does not go away. Sudden or extreme swelling of your face, hands, legs, or feet. Vision problems: You see spots. You have blurry vision. Your eyes are sensitive to light. If you can't reach the provider, go to an urgent care or emergency room. Get help right away if: You faint, become confused, or can't think clearly. You have chest pain or trouble breathing. You have any kind of injury, such as from a fall or a car crash. These symptoms may be an emergency. Call 911 right away. Do not wait to see if the symptoms will go away. Do not drive yourself to the hospital. This information is not intended to replace advice given to you by your health care provider. Make sure you discuss any questions you have with your health care provider. Document Revised: 11/12/2022 Document Reviewed: 06/12/2022 Elsevier Patient Education  2024 [employer].

## 2024-03-08 NOTE — Progress Notes (Deleted)
" ° ° °  Return Prenatal Note   Subjective   29 y.o. H0E7755 at [redacted]w[redacted]d presents for this follow-up prenatal visit.  Patient is doing well. She reports good fetal movement. She has some increased pelvic pain and pain shoots through her vagina. She has concerns about her abdomen being so soft and not firm. Her nipples have been peeling. She would like to discuss work accommodations.  Patient reports: Movement: Present Contractions: Not present  Objective   Flow sheet Vitals: Pulse Rate: 92 BP: 109/66 Total weight gain: 18 lb 1.6 oz (8.21 kg)  General Appearance  No acute distress, well appearing, and well nourished Pulmonary   Normal work of breathing Neurologic   Alert and oriented to person, place, and time Psychiatric   Mood and affect within normal limits   Assessment/Plan   Plan  28 y.o. [id number] at [redacted]w[redacted]d presents for follow-up OB visit. Reviewed prenatal record including previous visit note.  No problem-specific Assessment & Plan notes found for this encounter.      No orders of the defined types were placed in this encounter.  No follow-ups on file.   No future appointments.   For next visit:  ROB with 1 hour glucola, third trimester labs, and Tdap     Damien Parsley, CNM [facility] 01/16/269:48 AM "

## 2024-03-10 ENCOUNTER — Ambulatory Visit: Admitting: Certified Nurse Midwife

## 2024-03-10 ENCOUNTER — Encounter: Payer: Self-pay | Admitting: Certified Nurse Midwife

## 2024-03-10 VITALS — BP 109/66 | HR 92 | Wt 191.1 lb

## 2024-03-10 DIAGNOSIS — Z3480 Encounter for supervision of other normal pregnancy, unspecified trimester: Secondary | ICD-10-CM

## 2024-03-10 DIAGNOSIS — O09212 Supervision of pregnancy with history of pre-term labor, second trimester: Secondary | ICD-10-CM | POA: Diagnosis not present

## 2024-03-10 DIAGNOSIS — O99012 Anemia complicating pregnancy, second trimester: Secondary | ICD-10-CM

## 2024-03-10 DIAGNOSIS — Z3A25 25 weeks gestation of pregnancy: Secondary | ICD-10-CM | POA: Diagnosis not present

## 2024-03-10 DIAGNOSIS — Z8751 Personal history of pre-term labor: Secondary | ICD-10-CM

## 2024-03-10 DIAGNOSIS — Z130 Encounter for screening for diseases of the blood and blood-forming organs and certain disorders involving the immune mechanism: Secondary | ICD-10-CM

## 2024-03-10 DIAGNOSIS — O99013 Anemia complicating pregnancy, third trimester: Secondary | ICD-10-CM

## 2024-03-10 DIAGNOSIS — N96 Recurrent pregnancy loss: Secondary | ICD-10-CM

## 2024-03-10 DIAGNOSIS — D649 Anemia, unspecified: Secondary | ICD-10-CM

## 2024-03-10 DIAGNOSIS — Z114 Encounter for screening for human immunodeficiency virus [HIV]: Secondary | ICD-10-CM

## 2024-03-10 DIAGNOSIS — Z3482 Encounter for supervision of other normal pregnancy, second trimester: Secondary | ICD-10-CM

## 2024-03-10 DIAGNOSIS — Z113 Encounter for screening for infections with a predominantly sexual mode of transmission: Secondary | ICD-10-CM

## 2024-03-10 DIAGNOSIS — O219 Vomiting of pregnancy, unspecified: Secondary | ICD-10-CM

## 2024-03-10 DIAGNOSIS — Z131 Encounter for screening for diabetes mellitus: Secondary | ICD-10-CM

## 2024-03-10 MED ORDER — ONDANSETRON 4 MG PO TBDP: 4.0000 mg | ORAL_TABLET | Freq: Three times a day (TID) | ORAL | 2 refills | Status: AC | PRN

## 2024-03-10 NOTE — Assessment & Plan Note (Addendum)
-   Ordered zofran  refill, still feeling nauseous about 3x a day patient feels like zofran  has helped the most - Reports itchiness on nipples, recommended using coconut oil a few times a day to help moisturize and decrease itchiness - Letter given for work requesting 15 min breaks to help patient with movement and swelling on lower extremities. Patient denies headache, right upper quadrant pain, or vision changes.

## 2024-03-10 NOTE — Progress Notes (Signed)
" ° ° °  Return Prenatal Note   Subjective   29 y.o. Anita Robinson at [redacted]w[redacted]d presents for this follow-up prenatal visit.  Patient is doing well. She reports good fetal movement. She has some increased pelvic pain and pain shoots through her vagina. She has concerns about her abdomen being so soft and not firm. Her nipples have been peeling. She would like to discuss work accommodations.  Patient reports: Movement: Present Contractions: Not present  Objective   Flow sheet Vitals: Pulse Rate: 92 BP: 109/66 Fundal Height: 26 cm Fetal Heart Rate (bpm): 154 Total weight gain: 8.21 kg  General Appearance  No acute distress, well appearing, and well nourished Pulmonary   Normal work of breathing Neurologic   Alert and oriented to person, place, and time Psychiatric   Mood and affect within normal limits   Assessment/Plan   Plan  28 y.o. Anita Robinson at [redacted]w[redacted]d presents for follow-up OB visit. Reviewed prenatal record including previous visit note.  Supervision of other normal pregnancy, antepartum - Ordered zofran  refill, still feeling nauseous about 3x a day patient feels like zofran  has helped the most - Reports itchiness on nipples, recommended using coconut oil a few times a day to help moisturize and decrease itchiness - Letter given for work requesting 15 min breaks to help patient with movement and swelling on lower extremities. Patient denies headache, right upper quadrant pain, or vision changes.    Anemia in pregnancy Continues to take iron  supplementation, has no issues, feels energy levels have been consistent  History of preterm delivery - Reports occasional braxton hicks, reviewed early labor signs, hydration and rest when she notices them. Patient has noticed normal fetal movement no leaking of fluid of bleeding - Instructed to call office or come to hospital with persistent headache, vision changes, regular contractions, leaking of fluid, decreased fetal movement or vaginal  bleeding.       Orders Placed This Encounter  Procedures   28 Week RH+Panel    Standing Status:   Future    Expected Date:   03/31/2024    Expiration Date:   06/29/2024   No follow-ups on file.   Future Appointments  Date Time Provider Department Center  03/23/2024  8:15 AM Slaughterbeck, Damien, CNM [facility] None  03/23/2024  8:40 AM [facility] None     For next visit:  ROB with 1 hour glucola, third trimester labs, and Tdap     Damien Parsley, CNM [facility] 03/10/2608:38 AM "

## 2024-03-10 NOTE — Assessment & Plan Note (Deleted)
-   Reports occasional braxton hicks, reviewed early labor signs, hydration and rest when she notices them. Patient has noticed normal fetal movement no leaking of fluid of bleeding - Instructed to call office or come to hospital with persistent headache, vision changes, regular contractions, leaking of fluid, decreased fetal movement or vaginal bleeding.

## 2024-03-10 NOTE — Assessment & Plan Note (Signed)
-   Reports occasional braxton hicks, reviewed early labor signs, hydration and rest when she notices them. Patient has noticed normal fetal movement no leaking of fluid of bleeding - Instructed to call office or come to hospital with persistent headache, vision changes, regular contractions, leaking of fluid, decreased fetal movement or vaginal bleeding.

## 2024-03-10 NOTE — Assessment & Plan Note (Signed)
 Continues to take iron  supplementation, has no issues, feels energy levels have been consistent

## 2024-03-22 ENCOUNTER — Encounter: Payer: Self-pay | Admitting: Licensed Practical Nurse

## 2024-03-22 NOTE — Progress Notes (Unsigned)
" ° ° °  Return Prenatal Note   Subjective   29 y.o. [id number] at [redacted]w[redacted]d presents for this follow-up prenatal visit.  Patient *** Patient reports:    Objective   Flow sheet Vitals:   Total weight gain: 18 lb 1.6 oz (8.21 kg)  General Appearance  No acute distress, well appearing, and well nourished Pulmonary   Normal work of breathing Neurologic   Alert and oriented to person, place, and time Psychiatric   Mood and affect within normal limits   Assessment/Plan   Plan  28 y.o. [id number] at [redacted]w[redacted]d presents for follow-up OB visit. Reviewed prenatal record including previous visit note.  No problem-specific Assessment & Plan notes found for this encounter.      No orders of the defined types were placed in this encounter.  No follow-ups on file.   Future Appointments  Date Time Provider Department Center  03/23/2024  8:15 AM Slaughterbeck, Damien, CNM [facility] None  03/23/2024  8:40 AM [facility] None    For next visit:  continue with routine prenatal care     Damien Parsley, CNM [facility] 01/28/264:45 PM "

## 2024-03-22 NOTE — Patient Instructions (Signed)
 Second Trimester of Pregnancy  The second trimester of pregnancy is from week 14 through week 27. This is months 4 through 6 of pregnancy. During the second trimester: Morning sickness is less or has stopped. You may have more energy. You may feel hungry more often. At this time, your unborn baby is growing very fast. At the end of the sixth month, the unborn baby may be up to 12 inches long and weigh about 1 pounds. You will likely start to feel the baby move between 16 and 20 weeks of pregnancy. Body changes during your second trimester Your body continues to change during this time. The changes usually go away after your baby is born. Physical changes You will gain more weight. Your belly will get bigger. You may begin to get stretch marks on your hips, belly, and breasts. Your breasts will keep growing and may hurt. You may get dark spots or blotches on your face. A dark line from your belly button to the pubic area may appear. This line is called linea nigra. Your hair may grow faster and get thicker. Health changes You may have headaches. You may have heartburn. You may pee more often. You may have swollen, bulging veins (varicose veins). You may have trouble pooping (constipation), or swollen veins in the butt that can itch or get painful (hemorrhoids). You may have back pain. This is caused by: Weight gain. Pregnancy hormones that are relaxing the joints in your pelvis. Follow these instructions at home: Medicines Talk to your health care provider if you're taking medicines. Ask if the medicines are safe to take during pregnancy. Your provider may change the medicines that you take. Do not take any medicines unless told to by your provider. Take a prenatal vitamin that has at least 600 micrograms (mcg) of folic acid. Do not use herbal medicines, illegal drugs, or medicines that are not approved by your provider. Eating and drinking While you're pregnant your body needs  extra food for your growing baby. Talk with your provider about what to eat while pregnant. Activity Most women are able to exercise during pregnancy. Exercises may need to change as your pregnancy goes on. Talk to your provider about your activities and exercise routines. Relieving pain and discomfort Wear a good, supportive bra if your breasts hurt. Rest with your legs raised if you have leg cramps or low back pain. Take warm sitz baths to soothe pain from hemorrhoids. Use hemorrhoid cream if your provider says it's okay. Do not douche. Do not use tampons or scented pads. Do not use hot tubs, steam rooms, or saunas. Safety Wear your seatbelt at all times when you're in a car. Talk to your provider if someone hits you, hurts you, or yells at you. Talk with your provider if you're feeling sad or have thoughts of hurting yourself. Lifestyle Certain things can be harmful while you're pregnant. It's best to avoid the following: Do not drink alcohol,smoke, vape, or use products with nicotine  or tobacco in them. If you need help quitting, talk with your provider. Avoid cat litter boxes and soil used by cats. These things carry germs that can cause harm to your pregnancy and your baby. General instructions Keep all follow-up visits. It helps you and your unborn baby stay as healthy as possible. Write down your questions. Take them to your prenatal visits. Your provider will: Talk with you about your overall health. Give you advice or refer you to specialists who can help with different needs,  including: Prenatal education classes. Mental health and counseling. Foods and healthy eating. Ask for help if you need help with food. Where to find more information American Pregnancy Association: americanpregnancy.org [employer]necologists: acog.org Office on [employer] Health: [place].ca Contact a health care provider if: You have a headache that does not go away  when you take medicine. You have any of these problems: You can't eat or drink. You throw up or feel like you may throw up. You have watery poop (diarrhea) for 2 days or more. You have pain when you pee or your pee smells bad. You have been sick for 2 days or more and are not getting better. Contact your provider right away if: You have any of these coming from your vagina: Abnormal discharge. Bad-smelling fluid. Bleeding. Your baby is moving less than usual. You have contractions, belly cramping, or have pain in your pelvis or lower back. You have symptoms of high blood pressure or preeclampsia. These include: A severe, throbbing headache that does not go away. Sudden or extreme swelling of your face, hands, legs, or feet. Vision problems: You see spots. You have blurry vision. Your eyes are sensitive to light. If you can't reach the provider, go to an urgent care or emergency room. Get help right away if: You faint, become confused, or can't think clearly. You have chest pain or trouble breathing. You have any kind of injury, such as from a fall or a car crash. These symptoms may be an emergency. Call 911 right away. Do not wait to see if the symptoms will go away. Do not drive yourself to the hospital. This information is not intended to replace advice given to you by your health care provider. Make sure you discuss any questions you have with your health care provider. Document Revised: 11/12/2022 Document Reviewed: 06/12/2022 Elsevier Patient Education  2024 Elsevier Inc.Tdap (Tetanus, Diphtheria, Pertussis) Vaccine: What You Need to Know (CDC VIS) Many Vaccine Information Statements are available in Spanish and other languages. See http://www.mcdowell.com/ To view this CDC Vaccine Information Statement in English, click the link below or scan the QR code: https://pe.elsevier.com/6g2RRsUk  This information is not intended to replace advice given to you by  your health care provider. Make sure you discuss any questions you have with your health care provider. Vaccine Information Statements (VISs) are third - party content from [facility] & Immunize.org and Elsevier is not responsible for the content.     Immunize.org Market researcher.     Elsevier Patient Education  [employer].

## 2024-03-23 ENCOUNTER — Ambulatory Visit: Admitting: Certified Nurse Midwife

## 2024-03-23 ENCOUNTER — Other Ambulatory Visit

## 2024-03-23 VITALS — BP 99/59 | HR 97 | Wt 195.8 lb

## 2024-03-23 DIAGNOSIS — Z3480 Encounter for supervision of other normal pregnancy, unspecified trimester: Secondary | ICD-10-CM

## 2024-03-23 DIAGNOSIS — Z130 Encounter for screening for diseases of the blood and blood-forming organs and certain disorders involving the immune mechanism: Secondary | ICD-10-CM

## 2024-03-23 DIAGNOSIS — O34219 Maternal care for unspecified type scar from previous cesarean delivery: Secondary | ICD-10-CM

## 2024-03-23 DIAGNOSIS — Z131 Encounter for screening for diabetes mellitus: Secondary | ICD-10-CM

## 2024-03-23 DIAGNOSIS — D649 Anemia, unspecified: Secondary | ICD-10-CM | POA: Diagnosis not present

## 2024-03-23 DIAGNOSIS — O99013 Anemia complicating pregnancy, third trimester: Secondary | ICD-10-CM

## 2024-03-23 DIAGNOSIS — Z3A27 27 weeks gestation of pregnancy: Secondary | ICD-10-CM

## 2024-03-23 DIAGNOSIS — Z114 Encounter for screening for human immunodeficiency virus [HIV]: Secondary | ICD-10-CM

## 2024-03-23 DIAGNOSIS — O99012 Anemia complicating pregnancy, second trimester: Secondary | ICD-10-CM | POA: Diagnosis not present

## 2024-03-23 DIAGNOSIS — Z113 Encounter for screening for infections with a predominantly sexual mode of transmission: Secondary | ICD-10-CM

## 2024-03-23 DIAGNOSIS — Z98891 History of uterine scar from previous surgery: Secondary | ICD-10-CM

## 2024-03-23 NOTE — Progress Notes (Signed)
" ° ° °  Return Prenatal Note   Subjective   29 y.o. H0E7755 at [redacted]w[redacted]d presents for this follow-up prenatal visit.  Patient is doing well. She has been having some pelvic pressure, swelling in her vagina. She reports good fetal movement. She declined TDAP today. Offer it at her next visit. BTC and Medicaid completed. Patient reports: Movement: Present  Objective   Flow sheet Vitals: Pulse Rate: 97 BP: (!) 99/59 Fundal Height: 28 cm Fetal Heart Rate (bpm): 159 Total weight gain: 22 lb 12.8 oz (10.3 kg)  General Appearance  No acute distress, well appearing, and well nourished Pulmonary   Normal work of breathing Neurologic   Alert and oriented to person, place, and time Psychiatric   Mood and affect within normal limits   Assessment/Plan   Plan  28 y.o. H0E7755 at [redacted]w[redacted]d presents for follow-up OB visit. Reviewed prenatal record including previous visit note.  History of C-section Will need TOLAC counseling next visit.   Supervision of other normal pregnancy, antepartum 28 week labs done today. Reviewed reasons for TDAP in each pregnancy. Will consider for next visit. Reviewed red flag warning signs anticipatory guidance for upcoming prenatal care.    Anemia in pregnancy CBC ordered for today. Has been taking iron  every other day.     Future Appointments  Date Time Provider Department Center  04/06/2024  2:55 PM Leigh Sober, MD [facility] None     For next visit:  continue with routine prenatal care     Damien Parsley, CNM [facility] 01/29/269:07 AM "

## 2024-03-23 NOTE — Assessment & Plan Note (Signed)
 28 week labs done today. Reviewed reasons for TDAP in each pregnancy. Will consider for next visit. Reviewed red flag warning signs anticipatory guidance for upcoming prenatal care.

## 2024-03-23 NOTE — Assessment & Plan Note (Signed)
 Will need TOLAC counseling next visit.

## 2024-03-23 NOTE — Assessment & Plan Note (Signed)
 CBC ordered for today. Has been taking iron  every other day.

## 2024-03-24 LAB — 28 WEEK RH+PANEL
Basophils Absolute: 0 10*3/uL (ref 0.0–0.2)
Basos: 0 %
EOS (ABSOLUTE): 0.1 10*3/uL (ref 0.0–0.4)
Eos: 1 %
Gestational Diabetes Screen: 115 mg/dL (ref 70–139)
HIV Screen 4th Generation wRfx: NONREACTIVE
Hematocrit: 29.2 % — ABNORMAL LOW (ref 34.0–46.6)
Hemoglobin: 9 g/dL — ABNORMAL LOW (ref 11.1–15.9)
Immature Grans (Abs): 0.2 10*3/uL — ABNORMAL HIGH (ref 0.0–0.1)
Immature Granulocytes: 3 %
Lymphocytes Absolute: 1.3 10*3/uL (ref 0.7–3.1)
Lymphs: 17 %
MCH: 23.9 pg — ABNORMAL LOW (ref 26.6–33.0)
MCHC: 30.8 g/dL — ABNORMAL LOW (ref 31.5–35.7)
MCV: 78 fL — ABNORMAL LOW (ref 79–97)
Monocytes Absolute: 0.4 10*3/uL (ref 0.1–0.9)
Monocytes: 6 %
Neutrophils Absolute: 5.6 10*3/uL (ref 1.4–7.0)
Neutrophils: 73 %
Platelets: 256 10*3/uL (ref 150–450)
RBC: 3.77 x10E6/uL (ref 3.77–5.28)
RDW: 13.3 % (ref 11.7–15.4)
RPR Ser Ql: NONREACTIVE
WBC: 7.7 10*3/uL (ref 3.4–10.8)

## 2024-03-26 ENCOUNTER — Ambulatory Visit: Payer: Self-pay | Admitting: Certified Nurse Midwife

## 2024-03-28 ENCOUNTER — Other Ambulatory Visit: Payer: Self-pay

## 2024-03-28 ENCOUNTER — Inpatient Hospital Stay (HOSPITAL_COMMUNITY)
Admission: AD | Admit: 2024-03-28 | Discharge: 2024-03-28 | Disposition: A | Discharge status: Home / Self Care | Attending: Obstetrics & Gynecology | Admitting: Obstetrics & Gynecology

## 2024-03-28 ENCOUNTER — Encounter (HOSPITAL_COMMUNITY): Payer: Self-pay | Admitting: Obstetrics & Gynecology

## 2024-03-28 DIAGNOSIS — R109 Unspecified abdominal pain: Secondary | ICD-10-CM

## 2024-03-28 DIAGNOSIS — N930 Postcoital and contact bleeding: Secondary | ICD-10-CM | POA: Diagnosis not present

## 2024-03-28 DIAGNOSIS — O4693 Antepartum hemorrhage, unspecified, third trimester: Secondary | ICD-10-CM | POA: Insufficient documentation

## 2024-03-28 DIAGNOSIS — O26893 Other specified pregnancy related conditions, third trimester: Secondary | ICD-10-CM | POA: Diagnosis not present

## 2024-03-28 DIAGNOSIS — O26899 Other specified pregnancy related conditions, unspecified trimester: Secondary | ICD-10-CM

## 2024-03-28 DIAGNOSIS — Z3480 Encounter for supervision of other normal pregnancy, unspecified trimester: Secondary | ICD-10-CM

## 2024-03-28 DIAGNOSIS — O99013 Anemia complicating pregnancy, third trimester: Secondary | ICD-10-CM

## 2024-03-28 DIAGNOSIS — Z3689 Encounter for other specified antenatal screening: Secondary | ICD-10-CM

## 2024-03-28 DIAGNOSIS — Z3A28 28 weeks gestation of pregnancy: Secondary | ICD-10-CM | POA: Diagnosis not present

## 2024-03-28 DIAGNOSIS — R1030 Lower abdominal pain, unspecified: Secondary | ICD-10-CM | POA: Insufficient documentation

## 2024-03-28 LAB — WET PREP, GENITAL
Clue Cells Wet Prep HPF POC: NONE SEEN
Sperm: NONE SEEN
Trich, Wet Prep: NONE SEEN
WBC, Wet Prep HPF POC: >=10 — AB
Yeast Wet Prep HPF POC: NONE SEEN

## 2024-03-28 LAB — URINALYSIS, ROUTINE W REFLEX MICROSCOPIC
Bilirubin Urine: NEGATIVE
Glucose, UA: NEGATIVE mg/dL
Hgb urine dipstick: NEGATIVE
Ketones, ur: 20 mg/dL — AB
Leukocytes,Ua: NEGATIVE
Nitrite: NEGATIVE
Protein, ur: 30 mg/dL — AB
Specific Gravity, Urine: 1.021 (ref 1.005–1.030)
pH: 6 (ref 5.0–8.0)

## 2024-03-28 MED ORDER — LACTATED RINGERS IV BOLUS: 1000.0000 mL | Freq: Once | INTRAVENOUS | Status: DC

## 2024-03-28 NOTE — MAU Provider Note (Cosign Needed)
 History  Anita Robinson is a 29 y.o. F who presents in MAU at [redacted]w[redacted]d reporting small amount of vaginal bleeding when wiping this morning that went from bright red to brown but has since resolved. Patient reports lower abdominal cramping and pain that is constant since 1:30 PM with intermittent sharp pains. Patient reports last intercourse was 3 days ago and notes she had diarrhea once yesterday but feels this is not related. Patient reports slight decrease in fetal movement than usual and unsure if she is having braxton hicks contractions. Patient denies rupture of membranes.   The patient reports she receives her prenatal care at [facility] in [place] (available prenatal records reviewed)  Chief Complaint  Patient presents with   Abdominal Pain   Vaginal Bleeding   Decreased Fetal Movement   Abdominal Pain  Vaginal Bleeding The patient's primary symptoms include pelvic pain. Associated symptoms include abdominal pain.    OB History     Gravida  9   Para  4   Term  2   Preterm  2   AB  4   Living  4      SAB  4   IAB  0   Ectopic  0   Multiple      Live Births  4           Past Medical History:  Diagnosis Date   Anemia    on Iron     Past Surgical History:  Procedure Laterality Date   CESAREAN SECTION N/A 12/03/2019   Procedure: CESAREAN SECTION;  Surgeon: Storm Setter, DO;  Location: [facility] LD ORS;  Service: Obstetrics;  Laterality: N/A;   CLOSED REDUCTION MANDIBLE Left 01/28/2022   Procedure: CLOSED REDUCTION OF LEFT CONDYLOID FRACTURE WITH FIXATION;  Surgeon: Joanette Soulier, DMD;  Location: [facility] OR;  Service: Oral Surgery;  Laterality: Left;    Family History  Problem Relation Age of Onset   Asthma Mother    Healthy Father    Asthma Sister    Asthma Brother    Alcohol abuse Neg Hx    Arthritis Neg Hx    Birth defects Neg Hx    Cancer Neg Hx    COPD Neg Hx    Depression Neg Hx    Diabetes Neg Hx    Drug abuse Neg Hx    Early  death Neg Hx    Hearing loss Neg Hx    Heart disease Neg Hx    Hyperlipidemia Neg Hx    Hypertension Neg Hx    Kidney disease Neg Hx    Learning disabilities Neg Hx    Mental illness Neg Hx    Mental retardation Neg Hx    Miscarriages / Stillbirths Neg Hx    Stroke Neg Hx    Vision loss Neg Hx    Varicose Veins Neg Hx     Social History[1]  Allergies: Allergies[2]  Medications Prior to Admission  Medication Sig Dispense Refill Last Dose/Taking   ferrous sulfate  325 (65 FE) MG tablet Take 1 tablet (325 mg total) by mouth every other day. 60 tablet 3 Past Week   ondansetron  (ZOFRAN -ODT) 4 MG disintegrating tablet Take 1 tablet (4 mg total) by mouth every 8 (eight) hours as needed for nausea. 30 tablet 3 Past Month   Prenatal Vit-Fe Fumarate-FA (PRENATAL MULTIVITAMIN) TABS tablet Take 1 tablet by mouth daily at 12 noon.   03/27/2024   acetaminophen  (TYLENOL ) 500 MG tablet Take 1,000 mg by mouth every  6 (six) hours as needed (pain.).      metroNIDAZOLE  (FLAGYL ) 500 MG tablet Take 1 tablet (500 mg total) by mouth 2 (two) times daily. (Patient not taking: Reported on 03/23/2024) 14 tablet 0    ondansetron  (ZOFRAN -ODT) 4 MG disintegrating tablet Take 1 tablet (4 mg total) by mouth every 8 (eight) hours as needed for nausea or vomiting. (Patient not taking: Reported on 03/23/2024) 30 tablet 2     Review of Systems  Gastrointestinal:  Positive for abdominal pain.  Genitourinary:  Positive for pelvic pain and vaginal bleeding. Negative for dyspareunia and vaginal pain.   Physical Exam Blood pressure 108/64, pulse 92, temperature 99 F (37.2 C), temperature source Oral, resp. rate 18, last menstrual period 09/13/2023, SpO2 100%. Physical Exam Exam conducted with a chaperone present.  HENT:     Head: Normocephalic.  Abdominal:     Hernia: There is no hernia in the left inguinal area or right inguinal area.  Genitourinary:    General: Normal vulva.     Pubic Area: No rash or pubic lice.       Labia:        Right: No rash, tenderness, lesion or injury.        Left: No rash, tenderness, lesion or injury.      Urethra: No prolapse, urethral pain, urethral swelling or urethral lesion.     Comments: Posterior cervix during PAP  Lymphadenopathy:     Lower Body: No right inguinal adenopathy. No left inguinal adenopathy.  Neurological:     Mental Status: She is alert.    Pelvic exam was chaperoned by Berwyn Pinal, NT  The exam was performed by the student and supervised by me Speculum exam: No evidence of pooling, no evidence of blood in the vaginal vault, scant physiological discharge, cervix visually closed at the os    FHRT @ 1600  Reactive for GA Baseline 150 Variability moderate  Accelerations: present Decelerations: absent TOCO: Quite   FHRT @ 1730  Reactive for GA Baseline 145-150 Variability moderate  Accelerations: present Decelerations: spontaneous deceleration occurred with return to baseline  ( Patient was actively sitting up when this occurred) TOCO: UI   FHRT continued for reassurance @ 1800, Reactive for GA and patient acknowledges good FM's as evidence d by using the clicker and verbally acknowledging movement   MAU Course Procedures   MDM   HIGH   Rule out PPROM/preterm labor with vaginal bleeding at [redacted]w[redacted]d   Prenatal records reviewed  Physical exam preformed with pelvic Vaginal swabs obtained  Wet prep negative, GC pending  UA pending at discharge  NST for gestational age and fetal reassurance    Darryle Crouch, PA-S2   ASSESSMENT/PLAN Medical screening exam complete   PCB (post coital bleeding) Bleeding likely due to recent intercourse No evidence of blood in the vault Cervix visually closed at the os No evidence of infection on vaginal cultures obtained  [redacted] weeks gestation of pregnancy   NST (non-stress test) reactive on fetal surveillance NST reactive  Abdominal cramping affecting pregnancy UA obtained to rule out  UTI   Future Appointments  Date Time Provider Department Center  04/06/2024  2:55 PM Leigh Sober, MD [facility] None    Discharge from MAU in stable condition  See AVS for full description of educational information and instructions provided to the patient at time of discharge   Warning signs for worsening condition that would warrant emergency follow-up discussed  Patient may return to MAU as needed   ------------------------------------------------------------------------------------------  Attestation of Supervision of Student:  I confirm that I have verified the information documented in the physician assistant students note and that I have also personally reperformed the history, physical exam and all medical decision making activities.  I have verified that all services and findings are accurately documented in this student's note; and I agree with management and plan as outlined in the documentation. I have also made any necessary editorial changes.  I personally spoke with the patient and confirmed the HPI, I performed the physical exam, speculum exam.  I ordered and reviewed the lab work.  NST was reviewed by me.  The assessment and plan was discussed with me.  I agree with the documentation provided by the student above. ----------------------------------------------------------------------------------- Olam Dalton, MSN, [facility], [facility] for [facility]      [1]  Social History Tobacco Use   Smoking status: Never    Passive exposure: Never   Smokeless tobacco: Never  Vaping Use   Vaping status: Never Used  Substance Use Topics   Alcohol use: Not Currently    Comment: social drinker   Drug use: No  [2] No Known Allergies

## 2024-03-28 NOTE — MAU Note (Addendum)
 Anita Robinson is a 29 y.o. at 105w1d here in MAU reporting: she's having lower abdominal cramping that's constant and a sharp intermittent pain in RLQ that began this morning.  Also noticed small amt red VB when wiping that turned brown but has since resolved.  Reports last intercourse was 3 days ago. Denies LOF.  Endorses +FM, but less than usual.  States she's unsure if feeling FM versus having BH ctxs.  LMP: 09/13/2023 Onset of complaint: today Pain score: 4 Vitals:   03/28/24 1511  BP: 109/64  Pulse: 96  Resp: 18  Temp: 99 F (37.2 C)  SpO2: 99%     FHT: 155 bpm  Lab orders placed from triage: None

## 2024-03-28 NOTE — MAU Provider Note (Incomplete Revision)
 History  Anita Robinson is a 29 y.o. F who presents in MAU at [redacted]w[redacted]d reporting small amount of vaginal bleeding when wiping this morning that went from bright red to brown but has since resolved. Patient reports lower abdominal cramping and pain that is constant since 1:30 PM with intermittent sharp pains. Patient reports last intercourse was 3 days ago and notes she had diarrhea once yesterday but feels this is not related. Patient reports slight decrease in fetal movement than usual and unsure if she is having braxton hicks contractions. Patient denies rupture of membranes.   Chief Complaint  Patient presents with   Abdominal Pain   Vaginal Bleeding   Decreased Fetal Movement   Abdominal Pain  Vaginal Bleeding The patient's primary symptoms include pelvic pain. Associated symptoms include abdominal pain.    OB History     Gravida  9   Para  4   Term  2   Preterm  2   AB  4   Living  4      SAB  4   IAB  0   Ectopic  0   Multiple      Live Births  4           Past Medical History:  Diagnosis Date   Anemia    on Iron     Past Surgical History:  Procedure Laterality Date   CESAREAN SECTION N/A 12/03/2019   Procedure: CESAREAN SECTION;  Surgeon: Storm Setter, DO;  Location: [facility];  Service: Obstetrics;  Laterality: N/A;   CLOSED REDUCTION MANDIBLE Left 01/28/2022   Procedure: CLOSED REDUCTION OF LEFT CONDYLOID FRACTURE WITH FIXATION;  Surgeon: Joanette Soulier, DMD;  Location: [facility] OR;  Service: Oral Surgery;  Laterality: Left;    Family History  Problem Relation Age of Onset   Asthma Mother    Healthy Father    Asthma Sister    Asthma Brother    Alcohol abuse Neg Hx    Arthritis Neg Hx    Birth defects Neg Hx    Cancer Neg Hx    COPD Neg Hx    Depression Neg Hx    Diabetes Neg Hx    Drug abuse Neg Hx    Early death Neg Hx    Hearing loss Neg Hx    Heart disease Neg Hx    Hyperlipidemia Neg Hx    Hypertension Neg Hx    Kidney disease Neg Hx     Learning disabilities Neg Hx    Mental illness Neg Hx    Mental retardation Neg Hx    Miscarriages / Stillbirths Neg Hx    Stroke Neg Hx    Vision loss Neg Hx    Varicose Veins Neg Hx     Social History[1]  Allergies: Allergies[2]  Medications Prior to Admission  Medication Sig Dispense Refill Last Dose/Taking   ferrous sulfate  325 (65 FE) MG tablet Take 1 tablet (325 mg total) by mouth every other day. 60 tablet 3 Past Week   ondansetron  (ZOFRAN -ODT) 4 MG disintegrating tablet Take 1 tablet (4 mg total) by mouth every 8 (eight) hours as needed for nausea. 30 tablet 3 Past Month   Prenatal Vit-Fe Fumarate-FA (PRENATAL MULTIVITAMIN) TABS tablet Take 1 tablet by mouth daily at 12 noon.   03/27/2024   acetaminophen  (TYLENOL ) 500 MG tablet Take 1,000 mg by mouth every 6 (six) hours as needed (pain.).      metroNIDAZOLE  (FLAGYL ) 500 MG tablet Take 1 tablet (500  mg total) by mouth 2 (two) times daily. (Patient not taking: Reported on 03/23/2024) 14 tablet 0    ondansetron  (ZOFRAN -ODT) 4 MG disintegrating tablet Take 1 tablet (4 mg total) by mouth every 8 (eight) hours as needed for nausea or vomiting. (Patient not taking: Reported on 03/23/2024) 30 tablet 2     Review of Systems  Gastrointestinal:  Positive for abdominal pain.  Genitourinary:  Positive for pelvic pain and vaginal bleeding. Negative for dyspareunia and vaginal pain.   Physical Exam Blood pressure 108/64, pulse 92, temperature 99 F (37.2 C), temperature source Oral, resp. rate 18, last menstrual period 09/13/2023, SpO2 100%. Physical Exam Exam conducted with a chaperone present.  HENT:     Head: Normocephalic.  Abdominal:     Hernia: There is no hernia in the left inguinal area or right inguinal area.  Genitourinary:    General: Normal vulva.     Pubic Area: No rash or pubic lice.      Labia:        Right: No rash, tenderness, lesion or injury.        Left: No rash, tenderness, lesion or injury.      Urethra: No  prolapse, urethral pain, urethral swelling or urethral lesion.     Comments: Posterior cervix during PAP  Lymphadenopathy:     Lower Body: No right inguinal adenopathy. No left inguinal adenopathy.  Neurological:     Mental Status: She is alert.    FHRT Baseline Variability Accelerations Decelerations TOCO   MAU Course Procedures   MDM   HIGH   Rule out PPROM/preterm labor with vaginal bleeding at [redacted]w[redacted]d   Prenatal records reviewed  Physical exam preformed with pelvic Vaginal swabs obtained  Wet prep negative, GC pending  UA pending NST for gestational age and fetal reassurance    Darryle Crouch, PA-S2   ASSESSMENT Medical screening exam complete   PCB (post coital bleeding) ***  [redacted] weeks gestation of pregnancy ***  NST (non-stress test) reactive on fetal surveillance ***  Abdominal cramping affecting pregnancy ***    PLAN  Future Appointments  Date Time Provider Department Center  04/06/2024  2:55 PM Leigh Sober, MD [facility] None    Discharge from MAU in stable condition  See AVS for full description of educational information and instructions provided to the patient at time of discharge   Warning signs for worsening condition that would warrant emergency follow-up discussed  Patient may return to MAU as needed         [1]  Social History Tobacco Use   Smoking status: Never    Passive exposure: Never   Smokeless tobacco: Never  Vaping Use   Vaping status: Never Used  Substance Use Topics   Alcohol use: Not Currently    Comment: social drinker   Drug use: No  [2] No Known Allergies

## 2024-03-29 LAB — GC/CHLAMYDIA PROBE AMP (~~LOC~~) NOT AT ARMC
Chlamydia: NEGATIVE
Comment: NEGATIVE
Comment: NORMAL
Neisseria Gonorrhea: NEGATIVE

## 2024-04-04 ENCOUNTER — Encounter: Admitting: Obstetrics

## 2024-04-06 ENCOUNTER — Encounter: Admitting: Obstetrics
